# Patient Record
Sex: Female | Born: 1935 | Race: White | Hispanic: No | Smoking: Never smoker
Health system: Southern US, Community
[De-identification: ages and names within clinical notes are randomized; demographics above are authoritative.]

## PROBLEM LIST (undated history)

## (undated) DIAGNOSIS — I82409 Acute embolism and thrombosis of unspecified deep veins of unspecified lower extremity: Secondary | ICD-10-CM

## (undated) DIAGNOSIS — C4491 Basal cell carcinoma of skin, unspecified: Secondary | ICD-10-CM

## (undated) DIAGNOSIS — K5790 Diverticulosis of intestine, part unspecified, without perforation or abscess without bleeding: Secondary | ICD-10-CM

## (undated) DIAGNOSIS — C4490 Unspecified malignant neoplasm of skin, unspecified: Secondary | ICD-10-CM

## (undated) DIAGNOSIS — I10 Essential (primary) hypertension: Secondary | ICD-10-CM

## (undated) DIAGNOSIS — N289 Disorder of kidney and ureter, unspecified: Secondary | ICD-10-CM

## (undated) DIAGNOSIS — R55 Syncope and collapse: Secondary | ICD-10-CM

## (undated) DIAGNOSIS — S42009A Fracture of unspecified part of unspecified clavicle, initial encounter for closed fracture: Secondary | ICD-10-CM

## (undated) DIAGNOSIS — I639 Cerebral infarction, unspecified: Secondary | ICD-10-CM

## (undated) DIAGNOSIS — Z8739 Personal history of other diseases of the musculoskeletal system and connective tissue: Secondary | ICD-10-CM

## (undated) DIAGNOSIS — E785 Hyperlipidemia, unspecified: Secondary | ICD-10-CM

## (undated) DIAGNOSIS — Z905 Acquired absence of kidney: Secondary | ICD-10-CM

## (undated) DIAGNOSIS — Z90710 Acquired absence of both cervix and uterus: Secondary | ICD-10-CM

## (undated) DIAGNOSIS — Z923 Personal history of irradiation: Secondary | ICD-10-CM

## (undated) DIAGNOSIS — I8393 Asymptomatic varicose veins of bilateral lower extremities: Secondary | ICD-10-CM

## (undated) DIAGNOSIS — D0510 Intraductal carcinoma in situ of unspecified breast: Principal | ICD-10-CM

## (undated) DIAGNOSIS — R413 Other amnesia: Secondary | ICD-10-CM

## (undated) HISTORY — DX: Cerebral infarction, unspecified: I63.9

## (undated) HISTORY — PX: LAPAROSCOPIC CHOLECYSTECTOMY: SUR755

## (undated) HISTORY — PX: BRAIN SURGERY: SHX531

## (undated) HISTORY — DX: Basal cell carcinoma of skin, unspecified: C44.91

## (undated) HISTORY — PX: INTRACAPSULAR CATARACT EXTRACTION: SHX361

## (undated) HISTORY — DX: Hyperlipidemia, unspecified: E78.5

## (undated) HISTORY — DX: Essential (primary) hypertension: I10

## (undated) HISTORY — DX: Acute embolism and thrombosis of unspecified deep veins of unspecified lower extremity: I82.409

## (undated) HISTORY — DX: Acquired absence of kidney: Z90.5

## (undated) HISTORY — DX: Syncope and collapse: R55

## (undated) HISTORY — DX: Personal history of irradiation: Z92.3

## (undated) HISTORY — DX: Other amnesia: R41.3

## (undated) HISTORY — DX: Personal history of other diseases of the musculoskeletal system and connective tissue: Z87.39

## (undated) HISTORY — DX: Acquired absence of both cervix and uterus: Z90.710

## (undated) HISTORY — DX: Asymptomatic varicose veins of bilateral lower extremities: I83.93

## (undated) HISTORY — DX: Fracture of unspecified part of unspecified clavicle, initial encounter for closed fracture: S42.009A

## (undated) HISTORY — DX: Unspecified malignant neoplasm of skin, unspecified: C44.90

## (undated) HISTORY — DX: Disorder of kidney and ureter, unspecified: N28.9

## (undated) HISTORY — DX: Diverticulosis of intestine, part unspecified, without perforation or abscess without bleeding: K57.90

---
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
## 1976-06-29 HISTORY — PX: VAGINAL HYSTERECTOMY: SUR661

## 1999-01-28 ENCOUNTER — Other Ambulatory Visit: Admission: RE | Admit: 1999-01-28 | Discharge: 1999-01-28 | Payer: Self-pay | Admitting: Obstetrics and Gynecology

## 1999-06-03 ENCOUNTER — Encounter: Admission: RE | Admit: 1999-06-03 | Discharge: 1999-06-03 | Payer: Self-pay | Admitting: Obstetrics and Gynecology

## 1999-06-03 ENCOUNTER — Encounter: Payer: Self-pay | Admitting: Obstetrics and Gynecology

## 2000-04-23 ENCOUNTER — Other Ambulatory Visit: Admission: RE | Admit: 2000-04-23 | Discharge: 2000-04-23 | Payer: Self-pay | Admitting: Obstetrics and Gynecology

## 2000-11-09 ENCOUNTER — Encounter: Admission: RE | Admit: 2000-11-09 | Discharge: 2000-11-09 | Payer: Self-pay | Admitting: Emergency Medicine

## 2000-11-09 ENCOUNTER — Encounter: Payer: Self-pay | Admitting: Emergency Medicine

## 2001-05-23 ENCOUNTER — Other Ambulatory Visit: Admission: RE | Admit: 2001-05-23 | Discharge: 2001-05-23 | Payer: Self-pay | Admitting: Obstetrics and Gynecology

## 2001-09-01 ENCOUNTER — Encounter: Payer: Self-pay | Admitting: Obstetrics and Gynecology

## 2001-09-01 ENCOUNTER — Encounter: Admission: RE | Admit: 2001-09-01 | Discharge: 2001-09-01 | Payer: Self-pay | Admitting: Obstetrics and Gynecology

## 2002-05-24 ENCOUNTER — Encounter: Payer: Self-pay | Admitting: Cardiology

## 2002-05-24 ENCOUNTER — Encounter: Admission: RE | Admit: 2002-05-24 | Discharge: 2002-05-24 | Payer: Self-pay | Admitting: Cardiology

## 2002-06-01 ENCOUNTER — Encounter (HOSPITAL_BASED_OUTPATIENT_CLINIC_OR_DEPARTMENT_OTHER): Payer: Self-pay | Admitting: General Surgery

## 2002-06-02 ENCOUNTER — Ambulatory Visit (HOSPITAL_COMMUNITY): Admission: RE | Admit: 2002-06-02 | Discharge: 2002-06-03 | Payer: Self-pay | Admitting: General Surgery

## 2002-06-02 ENCOUNTER — Encounter (HOSPITAL_BASED_OUTPATIENT_CLINIC_OR_DEPARTMENT_OTHER): Payer: Self-pay | Admitting: General Surgery

## 2003-05-21 ENCOUNTER — Ambulatory Visit (HOSPITAL_COMMUNITY): Admission: RE | Admit: 2003-05-21 | Discharge: 2003-05-21 | Payer: Self-pay | Admitting: Obstetrics and Gynecology

## 2004-04-29 DIAGNOSIS — S42009A Fracture of unspecified part of unspecified clavicle, initial encounter for closed fracture: Secondary | ICD-10-CM

## 2004-04-29 HISTORY — DX: Fracture of unspecified part of unspecified clavicle, initial encounter for closed fracture: S42.009A

## 2004-09-09 ENCOUNTER — Ambulatory Visit (HOSPITAL_COMMUNITY): Admission: RE | Admit: 2004-09-09 | Discharge: 2004-09-09 | Payer: Self-pay | Admitting: Cardiology

## 2004-12-24 ENCOUNTER — Encounter: Admission: RE | Admit: 2004-12-24 | Discharge: 2004-12-24 | Payer: Self-pay | Admitting: Internal Medicine

## 2006-03-31 ENCOUNTER — Encounter: Admission: RE | Admit: 2006-03-31 | Discharge: 2006-03-31 | Payer: Self-pay | Admitting: Gastroenterology

## 2006-04-06 ENCOUNTER — Encounter: Admission: RE | Admit: 2006-04-06 | Discharge: 2006-04-06 | Payer: Self-pay | Admitting: Gastroenterology

## 2006-11-23 ENCOUNTER — Ambulatory Visit (HOSPITAL_COMMUNITY): Admission: RE | Admit: 2006-11-23 | Discharge: 2006-11-23 | Payer: Self-pay | Admitting: Obstetrics and Gynecology

## 2006-11-28 ENCOUNTER — Emergency Department (HOSPITAL_COMMUNITY): Admission: EM | Admit: 2006-11-28 | Discharge: 2006-11-28 | Payer: Self-pay | Admitting: Emergency Medicine

## 2006-11-28 DIAGNOSIS — R413 Other amnesia: Secondary | ICD-10-CM

## 2006-11-28 HISTORY — DX: Other amnesia: R41.3

## 2007-01-01 ENCOUNTER — Inpatient Hospital Stay (HOSPITAL_COMMUNITY): Admission: EM | Admit: 2007-01-01 | Discharge: 2007-01-03 | Payer: Self-pay | Admitting: Emergency Medicine

## 2007-01-01 ENCOUNTER — Ambulatory Visit: Payer: Self-pay | Admitting: Cardiology

## 2007-01-14 ENCOUNTER — Emergency Department (HOSPITAL_COMMUNITY): Admission: EM | Admit: 2007-01-14 | Discharge: 2007-01-14 | Payer: Self-pay | Admitting: Emergency Medicine

## 2007-05-20 HISTORY — PX: DOPPLER ECHOCARDIOGRAPHY: SHX263

## 2007-11-24 ENCOUNTER — Emergency Department (HOSPITAL_COMMUNITY): Admission: EM | Admit: 2007-11-24 | Discharge: 2007-11-24 | Payer: Self-pay | Admitting: Family Medicine

## 2007-11-25 ENCOUNTER — Encounter (INDEPENDENT_AMBULATORY_CARE_PROVIDER_SITE_OTHER): Payer: Self-pay | Admitting: Emergency Medicine

## 2007-11-25 ENCOUNTER — Ambulatory Visit: Payer: Self-pay | Admitting: Vascular Surgery

## 2007-11-25 ENCOUNTER — Ambulatory Visit (HOSPITAL_COMMUNITY): Admission: RE | Admit: 2007-11-25 | Discharge: 2007-11-25 | Payer: Self-pay | Admitting: Emergency Medicine

## 2007-11-28 DIAGNOSIS — I82409 Acute embolism and thrombosis of unspecified deep veins of unspecified lower extremity: Secondary | ICD-10-CM

## 2007-11-28 HISTORY — DX: Acute embolism and thrombosis of unspecified deep veins of unspecified lower extremity: I82.409

## 2007-12-31 DIAGNOSIS — I639 Cerebral infarction, unspecified: Secondary | ICD-10-CM

## 2007-12-31 HISTORY — DX: Cerebral infarction, unspecified: I63.9

## 2008-01-28 HISTORY — PX: OTHER SURGICAL HISTORY: SHX169

## 2008-04-04 ENCOUNTER — Ambulatory Visit (HOSPITAL_COMMUNITY): Admission: RE | Admit: 2008-04-04 | Discharge: 2008-04-04 | Payer: Self-pay | Admitting: Obstetrics and Gynecology

## 2008-08-18 ENCOUNTER — Emergency Department (HOSPITAL_COMMUNITY): Admission: EM | Admit: 2008-08-18 | Discharge: 2008-08-18 | Payer: Self-pay | Admitting: Emergency Medicine

## 2009-02-15 ENCOUNTER — Emergency Department (HOSPITAL_COMMUNITY): Admission: EM | Admit: 2009-02-15 | Discharge: 2009-02-15 | Payer: Self-pay | Admitting: Family Medicine

## 2009-04-11 ENCOUNTER — Ambulatory Visit (HOSPITAL_COMMUNITY): Admission: RE | Admit: 2009-04-11 | Discharge: 2009-04-11 | Payer: Self-pay | Admitting: Obstetrics and Gynecology

## 2009-06-29 DIAGNOSIS — D0510 Intraductal carcinoma in situ of unspecified breast: Secondary | ICD-10-CM

## 2009-06-29 HISTORY — DX: Intraductal carcinoma in situ of unspecified breast: D05.10

## 2010-04-03 ENCOUNTER — Ambulatory Visit: Payer: Self-pay | Admitting: Cardiology

## 2010-05-16 ENCOUNTER — Ambulatory Visit (HOSPITAL_COMMUNITY): Admission: RE | Admit: 2010-05-16 | Discharge: 2010-05-16 | Payer: Self-pay | Admitting: Obstetrics and Gynecology

## 2010-06-03 ENCOUNTER — Encounter: Admission: RE | Admit: 2010-06-03 | Discharge: 2010-06-03 | Payer: Self-pay | Admitting: Obstetrics and Gynecology

## 2010-06-03 DIAGNOSIS — D0510 Intraductal carcinoma in situ of unspecified breast: Secondary | ICD-10-CM | POA: Insufficient documentation

## 2010-06-10 ENCOUNTER — Encounter
Admission: RE | Admit: 2010-06-10 | Discharge: 2010-06-10 | Payer: Self-pay | Source: Home / Self Care | Attending: Obstetrics and Gynecology | Admitting: Obstetrics and Gynecology

## 2010-06-18 ENCOUNTER — Encounter
Admission: RE | Admit: 2010-06-18 | Discharge: 2010-06-18 | Payer: Self-pay | Source: Home / Self Care | Attending: Surgery | Admitting: Surgery

## 2010-06-18 ENCOUNTER — Ambulatory Visit (HOSPITAL_BASED_OUTPATIENT_CLINIC_OR_DEPARTMENT_OTHER)
Admission: RE | Admit: 2010-06-18 | Discharge: 2010-06-18 | Payer: Self-pay | Source: Home / Self Care | Attending: Surgery | Admitting: Surgery

## 2010-06-18 HISTORY — PX: BREAST LUMPECTOMY: SHX2

## 2010-06-20 ENCOUNTER — Ambulatory Visit: Payer: Self-pay | Admitting: Oncology

## 2010-07-04 ENCOUNTER — Ambulatory Visit
Admission: RE | Admit: 2010-07-04 | Discharge: 2010-07-29 | Payer: Self-pay | Source: Home / Self Care | Attending: Radiation Oncology | Admitting: Radiation Oncology

## 2010-07-09 ENCOUNTER — Ambulatory Visit: Payer: Self-pay | Admitting: Genetic Counselor

## 2010-07-15 ENCOUNTER — Encounter
Admission: RE | Admit: 2010-07-15 | Discharge: 2010-07-15 | Payer: Self-pay | Source: Home / Self Care | Attending: Radiation Oncology | Admitting: Radiation Oncology

## 2010-07-20 ENCOUNTER — Encounter: Payer: Self-pay | Admitting: Obstetrics and Gynecology

## 2010-07-30 ENCOUNTER — Ambulatory Visit: Payer: Federal, State, Local not specified - PPO | Attending: Radiation Oncology | Admitting: Radiation Oncology

## 2010-07-30 DIAGNOSIS — C50419 Malignant neoplasm of upper-outer quadrant of unspecified female breast: Secondary | ICD-10-CM | POA: Insufficient documentation

## 2010-07-30 DIAGNOSIS — D0590 Unspecified type of carcinoma in situ of unspecified breast: Secondary | ICD-10-CM | POA: Insufficient documentation

## 2010-07-30 DIAGNOSIS — L538 Other specified erythematous conditions: Secondary | ICD-10-CM | POA: Insufficient documentation

## 2010-07-30 DIAGNOSIS — Z510 Encounter for antineoplastic radiation therapy: Secondary | ICD-10-CM | POA: Insufficient documentation

## 2010-08-01 ENCOUNTER — Ambulatory Visit: Payer: Self-pay | Admitting: Cardiology

## 2010-08-07 ENCOUNTER — Ambulatory Visit (INDEPENDENT_AMBULATORY_CARE_PROVIDER_SITE_OTHER): Payer: Federal, State, Local not specified - PPO | Admitting: Cardiology

## 2010-08-07 DIAGNOSIS — I119 Hypertensive heart disease without heart failure: Secondary | ICD-10-CM

## 2010-08-07 DIAGNOSIS — Z79899 Other long term (current) drug therapy: Secondary | ICD-10-CM

## 2010-08-07 DIAGNOSIS — E7800 Pure hypercholesterolemia, unspecified: Secondary | ICD-10-CM

## 2010-09-08 LAB — URINALYSIS, ROUTINE W REFLEX MICROSCOPIC
Bilirubin Urine: NEGATIVE
Glucose, UA: NEGATIVE mg/dL
Hgb urine dipstick: NEGATIVE
Ketones, ur: NEGATIVE mg/dL
Nitrite: NEGATIVE
Protein, ur: NEGATIVE mg/dL
Specific Gravity, Urine: 1.021 (ref 1.005–1.030)
Urobilinogen, UA: 0.2 mg/dL (ref 0.0–1.0)
pH: 6 (ref 5.0–8.0)

## 2010-09-08 LAB — COMPREHENSIVE METABOLIC PANEL
ALT: 27 U/L (ref 0–35)
AST: 26 U/L (ref 0–37)
Albumin: 3.3 g/dL — ABNORMAL LOW (ref 3.5–5.2)
Alkaline Phosphatase: 42 U/L (ref 39–117)
BUN: 19 mg/dL (ref 6–23)
CO2: 25 mEq/L (ref 19–32)
Calcium: 9.4 mg/dL (ref 8.4–10.5)
Chloride: 106 mEq/L (ref 96–112)
Creatinine, Ser: 1.08 mg/dL (ref 0.4–1.2)
GFR calc Af Amer: 59 mL/min — ABNORMAL LOW (ref >60)
GFR calc non Af Amer: 49 mL/min — ABNORMAL LOW (ref >60)
Glucose, Bld: 116 mg/dL — ABNORMAL HIGH (ref 70–99)
Potassium: 4.3 mEq/L (ref 3.5–5.1)
Sodium: 139 mEq/L (ref 135–145)
Total Bilirubin: 0.8 mg/dL (ref 0.3–1.2)
Total Protein: 6.6 g/dL (ref 6.0–8.3)

## 2010-09-08 LAB — CBC
HCT: 39.5 % (ref 36.0–46.0)
Hemoglobin: 13.8 g/dL (ref 12.0–15.0)
MCH: 34.7 pg — ABNORMAL HIGH (ref 26.0–34.0)
MCHC: 34.9 g/dL (ref 30.0–36.0)
MCV: 99.2 fL (ref 78.0–100.0)
Platelets: 166 10*3/uL (ref 150–400)
RBC: 3.98 MIL/uL (ref 3.87–5.11)
RDW: 13 % (ref 11.5–15.5)
WBC: 4.6 10*3/uL (ref 4.0–10.5)

## 2010-09-08 LAB — URINE MICROSCOPIC-ADD ON

## 2010-09-08 LAB — DIFFERENTIAL
Basophils Absolute: 0.1 10*3/uL (ref 0.0–0.1)
Basophils Relative: 1 % (ref 0–1)
Eosinophils Absolute: 0.3 10*3/uL (ref 0.0–0.7)
Eosinophils Relative: 8 % — ABNORMAL HIGH (ref 0–5)
Lymphocytes Relative: 51 % — ABNORMAL HIGH (ref 12–46)
Lymphs Abs: 2.3 10*3/uL (ref 0.7–4.0)
Monocytes Absolute: 0.6 10*3/uL (ref 0.1–1.0)
Monocytes Relative: 12 % (ref 3–12)
Neutro Abs: 1.3 10*3/uL — ABNORMAL LOW (ref 1.7–7.7)
Neutrophils Relative %: 28 % — ABNORMAL LOW (ref 43–77)

## 2010-09-18 ENCOUNTER — Ambulatory Visit: Payer: Federal, State, Local not specified - PPO | Attending: Radiation Oncology | Admitting: Radiation Oncology

## 2010-09-18 DIAGNOSIS — D0590 Unspecified type of carcinoma in situ of unspecified breast: Secondary | ICD-10-CM | POA: Insufficient documentation

## 2010-09-18 DIAGNOSIS — Z923 Personal history of irradiation: Secondary | ICD-10-CM | POA: Insufficient documentation

## 2010-09-29 ENCOUNTER — Other Ambulatory Visit: Payer: Self-pay | Admitting: Oncology

## 2010-09-29 ENCOUNTER — Encounter (HOSPITAL_BASED_OUTPATIENT_CLINIC_OR_DEPARTMENT_OTHER): Payer: Federal, State, Local not specified - PPO | Admitting: Oncology

## 2010-09-29 DIAGNOSIS — D0590 Unspecified type of carcinoma in situ of unspecified breast: Secondary | ICD-10-CM

## 2010-09-29 LAB — COMPREHENSIVE METABOLIC PANEL
ALT: 26 U/L (ref 0–35)
AST: 22 U/L (ref 0–37)
Albumin: 4 g/dL (ref 3.5–5.2)
Alkaline Phosphatase: 66 U/L (ref 39–117)
BUN: 12 mg/dL (ref 6–23)
CO2: 26 mEq/L (ref 19–32)
Calcium: 9 mg/dL (ref 8.4–10.5)
Chloride: 104 mEq/L (ref 96–112)
Creatinine, Ser: 1.07 mg/dL (ref 0.40–1.20)
Glucose, Bld: 87 mg/dL (ref 70–99)
Potassium: 3.9 mEq/L (ref 3.5–5.3)
Sodium: 140 mEq/L (ref 135–145)
Total Bilirubin: 0.4 mg/dL (ref 0.3–1.2)
Total Protein: 6.5 g/dL (ref 6.0–8.3)

## 2010-09-29 LAB — CBC WITH DIFFERENTIAL/PLATELET
BASO%: 0.9 % (ref 0.0–2.0)
Basophils Absolute: 0 10*3/uL (ref 0.0–0.1)
EOS%: 5.6 % (ref 0.0–7.0)
Eosinophils Absolute: 0.3 10*3/uL (ref 0.0–0.5)
HCT: 37.3 % (ref 34.8–46.6)
HGB: 12.7 g/dL (ref 11.6–15.9)
LYMPH%: 30.8 % (ref 14.0–49.7)
MCH: 32.7 pg (ref 25.1–34.0)
MCHC: 34.1 g/dL (ref 31.5–36.0)
MCV: 95.7 fL (ref 79.5–101.0)
MONO#: 0.7 10*3/uL (ref 0.1–0.9)
MONO%: 13.1 % (ref 0.0–14.0)
NEUT#: 2.6 10*3/uL (ref 1.5–6.5)
NEUT%: 49.6 % (ref 38.4–76.8)
Platelets: 174 10*3/uL (ref 145–400)
RBC: 3.89 10*6/uL (ref 3.70–5.45)
RDW: 13.2 % (ref 11.2–14.5)
WBC: 5.2 10*3/uL (ref 3.9–10.3)
lymph#: 1.6 10*3/uL (ref 0.9–3.3)

## 2010-10-04 LAB — POCT URINALYSIS DIP (DEVICE)
Bilirubin Urine: NEGATIVE
Glucose, UA: NEGATIVE mg/dL
Hgb urine dipstick: NEGATIVE
Ketones, ur: NEGATIVE mg/dL
Nitrite: NEGATIVE
Protein, ur: NEGATIVE mg/dL
Specific Gravity, Urine: 1.015 (ref 1.005–1.030)
Urobilinogen, UA: 0.2 mg/dL (ref 0.0–1.0)
pH: 5.5 (ref 5.0–8.0)

## 2010-10-14 LAB — POCT URINALYSIS DIP (DEVICE)
Bilirubin Urine: NEGATIVE
Glucose, UA: NEGATIVE mg/dL
Hgb urine dipstick: NEGATIVE
Ketones, ur: NEGATIVE mg/dL
Nitrite: POSITIVE — AB
Protein, ur: NEGATIVE mg/dL
Specific Gravity, Urine: 1.02 (ref 1.005–1.030)
Urobilinogen, UA: 0.2 mg/dL (ref 0.0–1.0)
pH: 5.5 (ref 5.0–8.0)

## 2010-11-06 ENCOUNTER — Other Ambulatory Visit: Payer: Self-pay | Admitting: Cardiology

## 2010-11-06 DIAGNOSIS — E7800 Pure hypercholesterolemia, unspecified: Secondary | ICD-10-CM

## 2010-11-06 DIAGNOSIS — Z79899 Other long term (current) drug therapy: Secondary | ICD-10-CM

## 2010-11-11 NOTE — H&P (Signed)
NAME:  Shannon Serrano, Shannon Serrano              ACCOUNT NO.:  [id number]   MEDICAL RECORD NO.:  [id number]          PATIENT TYPE:  INP   LOCATION:  1823                         FACILITY:  [facility]   PHYSICIAN:  Cassell Clement, M.D. DATE OF BIRTH:  02-01-1929   DATE OF ADMISSION:  12/31/2006  DATE OF DISCHARGE:                              HISTORY & PHYSICAL   The patient is full code.  Total visit time approximately 55 minutes.  The patient's primary care physician and cardiologist is Dr. Patty Sermons,  [facility].  This patient was the historian as well as her  daughter, her husband and her granddaughter.  They were all good  historians.  The patient's chief complaint is chest pain.   HISTORY OF PRESENT ILLNESS:  Shannon Serrano is a 77 year old female with a  history of hypertension, hyperlipidemia, and fibromyalgia who presented  to the emergency room with a story of passing out.  The patient notes  early in the day drinking 2 ounces of margaritas a beer, which she notes  is significantly unusual for her but drank more than usual due to the  festivities.  She apparently had dinner around 9:00 p.m.  At around 9:30  p.m., she went outside to look at the fireworks with her granddaughter  and noted feeling funny, that is apparently lightheaded and then notes  shortly thereafter she was in the floor.  Per the granddaughter, this  was short lived with no apparent convulsions or loss of bowel or  bladder.  The patient has been having decreased p.o. intake over the  last few weeks, as Dr. Patty Sermons recommended this due to trying to lose  weight.  No recent nausea, vomiting or acute illness.  The patient was  seen in the emergency room in 11/28/2006 for a transient global amnesia.  CT of the head at the time was negative for any acute disease with signs  of an old right lacunar stroke.  This apparently lasted 7 hours.  She  was unable even to remember her name, which completely resolved.  The  patient has not taken any Flexeril over the last two weeks.  The patient  notes she has been taking her medications.  She denies any recent chest  pain or any severe high blood pressure episodes.  She denies any  paroxysmal nocturnal dyspnea or orthopnea.  She denies any palpitations.   PAST MEDICAL HISTORY:  1. The patient has never had a heart cath or coronary artery bypass      procedure.  2. She notes getting a stress test performed one or two years ago that      was normal per patient.  3. She has a history of hypertension.  4. History of hyperlipidemia.  5. History of fibromyalgia.  6. History of hysterectomy for fibroids.  She still has her ovaries.  7. She has had an appendectomy and gallbladder surgery.  8. She also had cataract surgery on the right eye.   MEDICATIONS:  1. Prinivil 10 mg by mouth twice a day.  2. Lopressor 50 mg twice a day.  3. Zetia 10 mg daily.  4. Coenzyme Q10 at 100 mg twice a day.  5. Flexeril 10 mg as needed.  6. Aspirin 325 mg as needed.  7. [place] which is now [place] 1 g by mouth twice a day.  8. Tylenol 500 p.r.n.   ALLERGIES:  1. Codeine, which causes a rash.  2. Penicillin, which causes nausea.  3. Sulfa drugs, which causes a rash.   SOCIAL HISTORY:  She lives with her husband.  She has four grown  children.  No tobacco use ever and rare alcohol use in the past.   FAMILY HISTORY:  Positive with a sister needing a pacemaker and her  father who passed away of sudden death at the age of 45.   REVIEW OF SYSTEMS:  Concerning her eyes, she has had a loss or change of  vision, which is chronic, and she has had eye pain or redness in the  past.  No excess watering or double or blurred vision.  Her ears and  hearing:  She has had chronic loss of hearing and buzzing noises in her  ear.  No ear infections or drainage.  No hoarseness or excessive  sneezing.  No nose bleeds or runny nose.  No difficulty swallowing.  No  wheezing or large quantities  of sputum.  No excessive cough or shortness  of breath.  She has had allergy and cold symptoms in the past.  No  pneumonias.  She has had no frequent or severe headaches.  No dizziness,  fainting spells prior, no seizures or convulsions.  No shaking or  twitching spells.  No paralysis of the limbs.  No numbness or tingling  of the body.  She has had unusual head and neck tension.  CARDIOVASCULAR:  As above, and she has had no chest pain, no low blood  pressure troubles, no problems with calf cramps with walking or  sensitivities of the fingers and toes in cold.  She does have varicose  veins and high blood pressure.  No history of heart murmurs or rheumatic  fever.  GASTROINTESTINAL:  No digestive difficulties.  No loss of  appetite.  No diarrhea or blood in the stools.  She has had hemorrhoids  and constipation history.  No diabetes or bladder disease.  She does  have a history of urinary incontinence with dribble.  No blood in the  urine or chronic urgency of urination, but she has had difficulty  starting and passing urine.  No flank pain.  GENITAL:  Female.  No  breast pain.  No tubal infections.  No bleeding history.  MUSCULOSKELETAL:  No arthritis or bursitis.  She does note neck and back  pain history, though.  EMOTIONAL/PSYCHOLOGIC:  No depression, no severe  tension, no recurrent fevers, no nervous exhaustion.  She has had  difficulty sleeping.  SKIN:  No rashes.  Other review of systems were  negative.   PHYSICAL EXAMINATION:  VITAL SIGNS:  Temperature 97.2 with a pulse of  82, respiratory rate of 18, blood pressure 134/77, saturations 97% on  room air.  GENERAL:  She is a female who looks her stated age in no acute distress.  HEENT:  She has a scapular bruise, laceration in the area of her right  occipital parietal region that is swollen and oozing a little blood.  Pupils equal, round, reactive to light.  Her extraocular movements are  intact, and her sclerae are anicteric  with moist mucous membranes.  NECK:  Supple with no lymphadenopathy, thyromegaly.  No  bruits.  No  jugular venous distention.  CARDIOVASCULAR:  Regular rhythm and rate with a normal S1, S2.  No S3 or  S4.  No displacement of the point of maximal impulse.  No carotid  bruits.  No jugular venous distention.  No abdominal bruits.  Pulses 2+  bilaterally dorsalis pedis and radial.  LUNGS:  Clear to auscultation bilaterally.  SKIN:  No rashes or lesions.  ABDOMEN:  Soft, nontender, nondistended with normoactive bowel sounds.  EXTREMITIES:  No clubbing, cyanosis.  She does have a hematoma  subcutaneously in the area of the anterior right portion of her right  leg with no tenderness or drop offs over the bone in that area.  She has  no apparent rib pain on palpation or back pain on palpation or neck pain  as well.  MUSCULOSKELETAL:  As above and no apparent effusions or joint effusions  or step-offs.  NEUROLOGIC:  She is alert and oriented x3 with cranial nerves II-XII  grossly intact.  Strength and sensation grossly intact.   LABORATORY DATA:  The patient's EKG on 11/28/2006 showed a normal sinus  rhythm at a vent rate of 88, PR interval 175, QRS 79 and QT corrected at  428, normal axis, normal intervals with no significant ST-T changes.  Her EKG here is pending.   LABORATORY DATA:  She has a sodium of 137, potassium of 3.8, chloride  107, bicarbonate 21, creatinine 1, glucose 147.  Cardiac markers at 2317  hours were negative.  INR 0.9, PT 12.7, venous blood gas 7.414 with a  CO2 of 35.3, bicarbonate 22.6.  CT of the head:  No official report here  yet, but she had evidence of subarachnoid hemorrhage, very small.  There  was mild small vessel ischemic changes and probable old right lacunar  stroke, which is new compared to a CT on 11/28/2006.   ASSESSMENT AND PLAN:  1. This is a patient with a history of hypertension, hyperlipidemia,      who presents with syncope, possibly related to  alcohol ingestion,      but we will rule out acute coronary syndrome or any erythemogenic      cause of her syncope or any orthostatic trouble.  We will get a      urinalysis, TSH, B12, folate, orthostatics, telemetry.  She will be      on telemetry in the ICU, and we will check an EKG, and cardiac      markers will be drawn x3, and also IV fluids.  2. For her subarachnoid hemorrhage, Dr. Jeral Fruit was consulted by the      emergency room physician, and he stated that the subarachnoid      hemorrhage was mild and stable, and he will see her in the morning.      She will be on no antiplatelets or any anticoagulants, and we will      control her blood pressure closely and aggressively.  We will also      have her on neuro checks every 2 hours.  She also has a hematoma of      the right leg.  We will get x-rays there.  For her scapula      hematoma, she is supposed to get some stitches or staples before      she leaves the emergency room, as she does have some oozing there      still.  Pain control with Tylenol.  DVT prophylaxis with pneumatic  compression devices on the right leg, and GI prophylaxis with      Protonix by mouth twice a day.      Darryl D. Prime, MD  Electronically Signed     ______________________________  Cassell Clement, M.D.    DDP/MEDQ  D:  01/01/2007  T:  01/01/2007  Job:  [id number]

## 2010-11-11 NOTE — Discharge Summary (Signed)
NAME:  Shannon Serrano, Shannon Serrano              ACCOUNT NO.:  [id number]   MEDICAL RECORD NO.:  [id number]          PATIENT TYPE:  INP   LOCATION:  2020                         FACILITY:  [facility]   PHYSICIAN:  Cassell Clement, M.D. DATE OF BIRTH:  Jul 26, 1928   DATE OF ADMISSION:  12/31/2006  DATE OF DISCHARGE:  01/03/2007                               DISCHARGE SUMMARY   FINAL DIAGNOSES:  1. Syncope.  2. Head trauma with small subarachnoid hemorrhage.  3. History of hypertension.  4. History of hyperlipidemia.  5. History of fibromyalgia.   OPERATIONS PERFORMED:  None.   HISTORY:  This is a 77 year old female with a history of essential  hypertension, hyperlipidemia and fibromyalgia.  She presented to the  emergency room after having syncope while standing on the porch of her  home watching fireworks at approximately 9:30 p.m.  As she fell, she did  hit her head and also hit her right elbow and right leg.  She was not  aware of any chest pain or palpitations.  Just prior to syncope, she had  a very brief awareness that she felt lightheaded and then the next thing  she knew she was on the floor and her granddaughter was helping her up.  She did suffer a laceration of the right occiput.  She came to the  emergency room where CT of the head showed that she had a small  subarachnoid hemorrhage in the right frontotemporal area but no evidence  of any shift.  Dr. Jeral Fruit was consulted and felt that the patient would  do fine with conservative management and that there was no need for any  surgical intervention.  There was also mild scalp hematoma for which she  was given staples in the emergency room.   HOSPITAL COURSE:  The patient was initially admitted to the coronary  care step-down unit for close observation.  She was monitored on  telemetry the entire hospital stay and she showed no evidence of any  arrhythmias which could be attributed to causing her syncope.  Her  activity was gradually  increased.  A follow-up scan showed no  progression and the patient was felt ready for discharge home on January 03, 2007.   ACCESSORY LABORATORY STUDIES:  Her CBC on admission showed a hemoglobin  of 12.7, white count 9000.  The following day, hemoglobin 11.5, her  white count was normal at 8200, platelet count was 146,000.  Electrolytes were normal with potassium 4.2, BUN 10, creatinine 1.03.  Coagulation studies were normal.  Liver function studies normal cardiac  enzymes showed no evidence of myocardial infarction.  Lipids showed  cholesterol 184, LDL 117, HDL 35, triglycerides 161.  Urinalysis was  unremarkable.  Cardiac markers were normal.  TSH was 4.53 which is  normal.  Vitamin B12 level was 1320.  Folate level was greater than 20.  Glycosylated hemoglobin was 6.2.  Her blood sugar was 117, nonfasting.   X-ray of the right tibia-fibula showed no evidence of fracture or  dislocation.  X-ray of the right elbow showed no evidence of any acute  bony or joint abnormality.  The patient is being discharged improved on the following regimen.  1. Lopressor 50 mg twice a day.  2. Lisinopril 10 mg twice a day.  3. Zetia 10 mg daily.  4. Coenzyme Q 10 one twice a day.  5. Aspirin 81 mg daily and she will start that on January 17, 2007.  6. Lovaza 1 gram twice a day, start that on January 17, 2007.  7. Tylenol 500 mg two every 6 hours p.r.n. for pain.  8. She also has Flexeril which she takes p.r.n. for fibromyalgia pain.   She will return to the, Urgent Care on January 10, 2007, one week from  today for staple removal.  She is to avoid any heavy lifting or  straining.  She is to keep her regular appointment to see Dr. Patty Sermons  in August or sooner p.r.n.  She will be on a low sodium heart healthy,  no free sweets diet.           ______________________________  Cassell Clement, M.D.     TB/MEDQ  D:  01/03/2007  T:  01/03/2007  Job:  [id number]   cc:   Hilda Lias, M.D.

## 2010-11-11 NOTE — Consult Note (Signed)
NAME:  Shannon Serrano, Shannon Serrano              ACCOUNT NO.:  [id number]   MEDICAL RECORD NO.:  [id number]          PATIENT TYPE:  INP   LOCATION:  2904                         FACILITY:  [facility]   PHYSICIAN:  Hilda Lias, M.D.   DATE OF BIRTH:  May 29, 1929   DATE OF CONSULTATION:  01/01/2007  DATE OF DISCHARGE:                                 CONSULTATION   CLINICAL HISTORY:  [name] was brought to the emergency room last  night because she fell about 9 p.m. on December 31, 2006.  She was brought to  the emergency room.  Workup was done including a CT scan.  The patient  was awake, oriented, following commands.  Because of the findings of the  CT scan, we were called to see [name].  At the present time she is  awake, oriented x3.  She is following commands.  She complains of nausea  and some mild dizziness.  She complains of some tenderness in the scalp.  She remembered falling but she does not remember coming to the emergency  room.   PHYSICAL EXAMINATION:  GENERAL:  Clinically, she is oriented x3.  HEENT:  The pupils are equal and reactive at 3 mm.  She has full ocular  movements.  Face is symmetrical.  She sticks out her tongue in midline  and is able to move her shoulders.  She has some tenderness in the  scalp.  STRENGTH:  She has normal strength in the upper and lower extremities.  REFLEXES:  1+.  SENSATION:  Normal.  MUSCULOSKELETAL:  She has some tenderness in the cervical spine.   The CT scan of the head showed she has a small subarachnoid hemorrhage  in the right frontotemporal area.  There is no evidence of any shift  whatsoever.  There is some mild scalp hematoma.  Cervical spine x-ray  shows some degenerative disk disease, mostly at the level of C5-C6.   CLINICAL IMPRESSION:  1. Syncopal episode to be worked up by Dr. Patty Sermons.  2. Traumatic small subarachnoid hemorrhage which there is no need for      surgical intervention.  3. Cervical spondylosis at C5, C6 and  C7.   RECOMMENDATIONS:  We are going to follow the patient while she is in the  hospital.  We are going to repeat the CT scan in 24 hours.  I talked to  her and her husband who were present.           ______________________________  Hilda Lias, M.D.     EB/MEDQ  D:  01/01/2007  T:  01/01/2007  Job:  [id number]

## 2010-11-17 ENCOUNTER — Encounter (INDEPENDENT_AMBULATORY_CARE_PROVIDER_SITE_OTHER): Payer: Self-pay | Admitting: Surgery

## 2010-11-20 ENCOUNTER — Other Ambulatory Visit: Payer: Self-pay | Admitting: Cardiology

## 2010-11-20 DIAGNOSIS — M62838 Other muscle spasm: Secondary | ICD-10-CM

## 2010-11-20 DIAGNOSIS — E785 Hyperlipidemia, unspecified: Secondary | ICD-10-CM

## 2010-12-03 ENCOUNTER — Other Ambulatory Visit: Payer: Self-pay | Admitting: *Deleted

## 2010-12-03 DIAGNOSIS — E785 Hyperlipidemia, unspecified: Secondary | ICD-10-CM

## 2010-12-04 ENCOUNTER — Other Ambulatory Visit (INDEPENDENT_AMBULATORY_CARE_PROVIDER_SITE_OTHER): Payer: Federal, State, Local not specified - PPO | Admitting: *Deleted

## 2010-12-04 ENCOUNTER — Other Ambulatory Visit: Payer: Self-pay | Admitting: Cardiology

## 2010-12-04 ENCOUNTER — Encounter: Payer: Self-pay | Admitting: Cardiology

## 2010-12-04 DIAGNOSIS — E785 Hyperlipidemia, unspecified: Secondary | ICD-10-CM

## 2010-12-04 LAB — HEPATIC FUNCTION PANEL
ALT: 28 U/L (ref 0–35)
AST: 30 U/L (ref 0–37)
Albumin: 4.2 g/dL (ref 3.5–5.2)
Alkaline Phosphatase: 59 U/L (ref 39–117)
Bilirubin, Direct: 0.1 mg/dL (ref 0.0–0.3)
Total Bilirubin: 0.5 mg/dL (ref 0.3–1.2)
Total Protein: 7.2 g/dL (ref 6.0–8.3)

## 2010-12-04 LAB — LIPID PANEL
Cholesterol: 214 mg/dL — ABNORMAL HIGH (ref 0–200)
HDL: 59.6 mg/dL
Total CHOL/HDL Ratio: 4
Triglycerides: 108 mg/dL (ref 0.0–149.0)
VLDL: 21.6 mg/dL (ref 0.0–40.0)

## 2010-12-04 LAB — BASIC METABOLIC PANEL WITH GFR
BUN: 16 mg/dL (ref 6–23)
CO2: 24 meq/L (ref 19–32)
Calcium: 9 mg/dL (ref 8.4–10.5)
Chloride: 97 meq/L (ref 96–112)
Creatinine, Ser: 1 mg/dL (ref 0.4–1.2)
GFR: 56.77 mL/min — ABNORMAL LOW
Glucose, Bld: 107 mg/dL — ABNORMAL HIGH (ref 70–99)
Potassium: 4.1 meq/L (ref 3.5–5.1)
Sodium: 134 meq/L — ABNORMAL LOW (ref 135–145)

## 2010-12-04 LAB — LDL CHOLESTEROL, DIRECT: Direct LDL: 130 mg/dL

## 2010-12-05 ENCOUNTER — Encounter: Payer: Self-pay | Admitting: Cardiology

## 2010-12-05 ENCOUNTER — Ambulatory Visit (INDEPENDENT_AMBULATORY_CARE_PROVIDER_SITE_OTHER): Payer: Federal, State, Local not specified - PPO | Admitting: Cardiology

## 2010-12-05 DIAGNOSIS — C50919 Malignant neoplasm of unspecified site of unspecified female breast: Secondary | ICD-10-CM

## 2010-12-05 DIAGNOSIS — E7800 Pure hypercholesterolemia, unspecified: Secondary | ICD-10-CM | POA: Insufficient documentation

## 2010-12-05 DIAGNOSIS — I119 Hypertensive heart disease without heart failure: Secondary | ICD-10-CM

## 2010-12-05 NOTE — Assessment & Plan Note (Signed)
The patient has a history of essential hypertension.  She has been doing well on her present medication.  Since last visit she has lost 5 pounds.  She's not been expressing any chest pain or shortness of breath.  His had no further dizziness or syncope.

## 2010-12-05 NOTE — Assessment & Plan Note (Signed)
Earlier this year the patient had a lumpectomy for breast cancer of the left breast.  This was followed by radiation therapy.

## 2010-12-05 NOTE — Assessment & Plan Note (Signed)
Patient has a past history of dyslipidemia.  Her cholesterol and LDL remain elevated even though she has lost 5 pounds.  We reviewed her diet and she does eat a lot of cheese.  She will cut back on her cheese consumption before next visit.  She is not having any side effects from her present medications.  She was unable to tolerate statins because of myalgias.

## 2010-12-05 NOTE — Progress Notes (Signed)
Monico Blitz Date of Birth:  Jan 08, 1929 [facility] / [facility] [address]   [address] [place], [place]  [address] [phone]           Fax   [phone]  History of Present Illness: This pleasant 77 year old woman is seen for a scheduled followup office visit.  She continues to enjoy good health.  He continues to work full-time for the [employer].  She denies any chest pain or shortness of breath has a history of essential hypertension and a history of hyperlipidemia the she does not have any history of ischemic heart disease.  She had a normal nuclear stress test in 08/27/05.  She's had a past history of mini strokes and she had a echocardiogram in November 2008 including a bubble study which showed no evidence of right to left shunt and she had mild LVH with normal systolic function and with impaired relaxation and mild aortic sclerosis.  The patient has lost 5 pounds since last visit which she attributes to having spent a long time in the hospital while her adult daughter recovered from a serious fall.  Current Outpatient Prescriptions  Medication Sig Dispense Refill  . aspirin 81 MG tablet Take 81 mg by mouth daily.        . Calcium Carb-Cholecalciferol (CALCIUM 1000 + D PO) Take 1,200 tablets by mouth daily.        Marland Kitchen co-enzyme Q-10 30 MG capsule Take 30 mg by mouth 2 (two) times daily.        . cyclobenzaprine (FLEXERIL) 10 MG tablet TAKE 1 TABLET BY MOUTH THREE TIMES DAILY AS NEEDED  30 tablet  1  . fish oil-omega-3 fatty acids 1000 MG capsule Take by mouth 2 (two) times daily.       Marland Kitchen lisinopril (PRINIVIL,ZESTRIL) 20 MG tablet Take 20 mg by mouth 2 (two) times daily.        . metoprolol (LOPRESSOR) 50 MG tablet Take 50 mg by mouth 2 (two) times daily.        . Niacin, Antihyperlipidemic, (NIASPAN PO) Take 100 mg by mouth daily.        Marland Kitchen ZETIA 10 MG tablet TAKE ONE TABLET BY MOUTH DAILY  30 tablet  PRN  . DISCONTD: Doxycycline-Eyelid Cleansers (ALODOX  CONVENIENCE CO) by Combination route.        Marland Kitchen DISCONTD: nitrofurantoin (MACRODANTIN) 100 MG capsule Take 100 mg by mouth daily.        Marland Kitchen DISCONTD: omega-3 acid ethyl esters (LOVAZA) 1 G capsule Take 2 g by mouth 2 (two) times daily.          Allergies  Allergen Reactions  . Codeine   . Morphine And Related   . Penicillins   . Percocet (Oxycodone-Acetaminophen)   . Sulfa Antibiotics     Patient Active Problem List  Diagnoses  . Benign hypertensive heart disease without heart failure  . Hypercholesterolemia  . Breast cancer    History  Smoking status  . Never Smoker   Smokeless tobacco  . Not on file    History  Alcohol Use No    Family History  Problem Relation Age of Onset  . Heart disease Mother   . Hypertension Mother   . Diabetes Father   . Heart disease Father     Review of Systems: Constitutional: no fever chills diaphoresis or fatigue or change in weight.  Head and neck: no hearing loss, no epistaxis, no photophobia or visual disturbance. Respiratory: No  cough, shortness of breath or wheezing. Cardiovascular: No chest pain peripheral edema, palpitations. Gastrointestinal: No abdominal distention, no abdominal pain, no change in bowel habits hematochezia or melena. Genitourinary: No dysuria, no frequency, no urgency, no nocturia. Musculoskeletal:No arthralgias, no back pain, no gait disturbance or myalgias. Neurological: No dizziness, no headaches, no numbness, no seizures, no syncope, no weakness, no tremors. Hematologic: No lymphadenopathy, no easy bruising. Psychiatric: No confusion, no hallucinations, no sleep disturbance.    Physical Exam: Filed Vitals:   12/05/10 1102  BP: 120/76  Pulse: 80  The general appearance reveals a well-developed well-nourished woman who appears younger than her stated age.The head and neck exam reveals pupils equal and reactive.  Extraocular movements are full.  There is no scleral icterus.  The mouth and pharynx are  normal.  The neck is supple.  The carotids reveal no bruits.  The jugular venous pressure is normal.  The  thyroid is not enlarged.  There is no lymphadenopathy.  The chest is clear to percussion and auscultation.  There are no rales or rhonchi.  Expansion of the chest is symmetrical.  The precordium is quiet.  The first heart sound is normal.  The second heart sound is physiologically split.  There is no murmur gallop rub or click.  There is no abnormal lift or heave.  The abdomen is soft and nontender.  The bowel sounds are normal.  The liver and spleen are not enlarged.  There are no abdominal masses.  There are no abdominal bruits.  Extremities reveal good pedal pulses.  There is no phlebitis or edema.  There is no cyanosis or clubbing.  Strength is normal and symmetrical in all extremities.  There is no lateralizing weakness.  There are no sensory deficits.  The skin is warm and dry.  There is no rash.   Assessment / Plan: Continue same medication.  Recheck in 4 months for followup office visit and lab work

## 2011-02-10 ENCOUNTER — Other Ambulatory Visit: Payer: Self-pay | Admitting: Cardiology

## 2011-02-10 DIAGNOSIS — M791 Myalgia, unspecified site: Secondary | ICD-10-CM

## 2011-02-11 NOTE — Telephone Encounter (Signed)
Refilled flexeril 10mg . To [facility]

## 2011-03-25 LAB — POCT I-STAT, CHEM 8
BUN: 20
Calcium, Ion: 1.08 — ABNORMAL LOW
Chloride: 104
Creatinine, Ser: 1.2
Glucose, Bld: 88
HCT: 45
Hemoglobin: 15.3 — ABNORMAL HIGH
Potassium: 4.7
Sodium: 135
TCO2: 25

## 2011-03-25 LAB — CBC
HCT: 41.3
Hemoglobin: 14.1
MCHC: 34.1
MCV: 94.6
Platelets: 189
RBC: 4.37
RDW: 13.4
WBC: 6.4

## 2011-03-25 LAB — DIFFERENTIAL
Basophils Absolute: 0.1
Basophils Relative: 1
Eosinophils Absolute: 0.4
Eosinophils Relative: 6 — ABNORMAL HIGH
Lymphocytes Relative: 50 — ABNORMAL HIGH
Lymphs Abs: 3.2
Monocytes Absolute: 0.8
Monocytes Relative: 13 — ABNORMAL HIGH
Neutro Abs: 1.9
Neutrophils Relative %: 29 — ABNORMAL LOW

## 2011-03-25 LAB — APTT: aPTT: 32

## 2011-03-25 LAB — PROTIME-INR
INR: 0.9
Prothrombin Time: 12.8

## 2011-03-25 LAB — D-DIMER, QUANTITATIVE: D-Dimer, Quant: 0.86 — ABNORMAL HIGH

## 2011-03-27 ENCOUNTER — Ambulatory Visit
Admission: RE | Admit: 2011-03-27 | Discharge: 2011-03-27 | Disposition: A | Discharge status: Home / Self Care | Payer: Federal, State, Local not specified - PPO | Source: Ambulatory Visit | Attending: Radiation Oncology | Admitting: Radiation Oncology

## 2011-03-31 ENCOUNTER — Other Ambulatory Visit: Payer: Self-pay | Admitting: Radiation Oncology

## 2011-03-31 DIAGNOSIS — C50919 Malignant neoplasm of unspecified site of unspecified female breast: Secondary | ICD-10-CM

## 2011-03-31 DIAGNOSIS — Z9889 Other specified postprocedural states: Secondary | ICD-10-CM

## 2011-04-14 LAB — I-STAT 8, (EC8 V) (CONVERTED LAB)
Acid-base deficit: 1
BUN: 21
Bicarbonate: 22.6
Chloride: 107
Glucose, Bld: 146 — ABNORMAL HIGH
HCT: 42
Hemoglobin: 14.3
Operator id: 272551
Potassium: 3.8
Sodium: 137
TCO2: 24
pCO2, Ven: 35.3 — ABNORMAL LOW
pH, Ven: 7.414 — ABNORMAL HIGH

## 2011-04-14 LAB — CARDIAC PANEL(CRET KIN+CKTOT+MB+TROPI)
CK, MB: 1.9
CK, MB: 1.9
Relative Index: 0.8
Relative Index: 0.8
Total CK: 233 — ABNORMAL HIGH
Total CK: 249 — ABNORMAL HIGH
Troponin I: 0.01
Troponin I: 0.01

## 2011-04-14 LAB — LIPID PANEL
Cholesterol: 184
HDL: 35 — ABNORMAL LOW
LDL Cholesterol: 117 — ABNORMAL HIGH
Total CHOL/HDL Ratio: 5.3
Triglycerides: 161 — ABNORMAL HIGH
VLDL: 32

## 2011-04-14 LAB — URINE MICROSCOPIC-ADD ON

## 2011-04-14 LAB — COMPREHENSIVE METABOLIC PANEL
ALT: 27
AST: 21
Albumin: 2.9 — ABNORMAL LOW
Alkaline Phosphatase: 43
BUN: 10
CO2: 25
Calcium: 8.5
Chloride: 105
Creatinine, Ser: 1.03
GFR calc Af Amer: >60
GFR calc non Af Amer: 52 — ABNORMAL LOW
Glucose, Bld: 117 — ABNORMAL HIGH
Potassium: 4.2
Sodium: 138
Total Bilirubin: 0.6
Total Protein: 5.1 — ABNORMAL LOW

## 2011-04-14 LAB — CBC
HCT: 34.3 — ABNORMAL LOW
HCT: 37.7
Hemoglobin: 11.5 — ABNORMAL LOW
Hemoglobin: 12.7
MCHC: 33.6
MCHC: 33.6
MCV: 94.3
MCV: 95
Platelets: 146 — ABNORMAL LOW
Platelets: 175
RBC: 3.62 — ABNORMAL LOW
RBC: 4
RDW: 13.7
RDW: 13.9
WBC: 8.2
WBC: 9.9

## 2011-04-14 LAB — URINALYSIS, ROUTINE W REFLEX MICROSCOPIC
Bilirubin Urine: NEGATIVE
Glucose, UA: NEGATIVE
Hgb urine dipstick: NEGATIVE
Ketones, ur: NEGATIVE
Nitrite: NEGATIVE
Protein, ur: NEGATIVE
Specific Gravity, Urine: 1.023
Urobilinogen, UA: 0.2
pH: 6

## 2011-04-14 LAB — DIFFERENTIAL
Basophils Absolute: 0
Basophils Relative: 0
Eosinophils Absolute: 0.1
Eosinophils Relative: 1
Lymphocytes Relative: 20
Lymphs Abs: 2
Monocytes Absolute: 0.7
Monocytes Relative: 7
Neutro Abs: 7.1
Neutrophils Relative %: 72

## 2011-04-14 LAB — POCT CARDIAC MARKERS
CKMB, poc: 1.2
Myoglobin, poc: 68.4
Operator id: 272551
Troponin i, poc: <0.05

## 2011-04-14 LAB — PROTIME-INR
INR: 0.9
Prothrombin Time: 12.7

## 2011-04-14 LAB — APTT: aPTT: 27

## 2011-04-14 LAB — VITAMIN B12: Vitamin B-12: 1320 — ABNORMAL HIGH (ref 211–911)

## 2011-04-14 LAB — POCT I-STAT CREATININE
Creatinine, Ser: 1
Operator id: 272551

## 2011-04-14 LAB — HEMOGLOBIN A1C: Hgb A1c MFr Bld: 6.2 — ABNORMAL HIGH

## 2011-04-14 LAB — FOLATE: Folate: >20

## 2011-04-14 LAB — TSH: TSH: 4.531

## 2011-04-16 LAB — I-STAT 8, (EC8 V) (CONVERTED LAB)
Acid-base deficit: 1
BUN: 27 — ABNORMAL HIGH
Bicarbonate: 24.4 — ABNORMAL HIGH
Chloride: 107
Glucose, Bld: 106 — ABNORMAL HIGH
HCT: 46
Hemoglobin: 15.6 — ABNORMAL HIGH
Operator id: 277751
Potassium: 3.9
Sodium: 136
TCO2: 26
pCO2, Ven: 44.3 — ABNORMAL LOW
pH, Ven: 7.35 — ABNORMAL HIGH

## 2011-04-16 LAB — POCT I-STAT CREATININE
Creatinine, Ser: 1.2
Operator id: 277751

## 2011-05-08 ENCOUNTER — Other Ambulatory Visit: Payer: Self-pay | Admitting: *Deleted

## 2011-05-08 DIAGNOSIS — E7800 Pure hypercholesterolemia, unspecified: Secondary | ICD-10-CM

## 2011-05-14 ENCOUNTER — Other Ambulatory Visit: Payer: Self-pay | Admitting: Cardiology

## 2011-05-14 ENCOUNTER — Other Ambulatory Visit (INDEPENDENT_AMBULATORY_CARE_PROVIDER_SITE_OTHER): Payer: Federal, State, Local not specified - PPO | Admitting: *Deleted

## 2011-05-14 DIAGNOSIS — E7800 Pure hypercholesterolemia, unspecified: Secondary | ICD-10-CM

## 2011-05-14 LAB — HEPATIC FUNCTION PANEL
ALT: 36 U/L — ABNORMAL HIGH (ref 0–35)
AST: 29 U/L (ref 0–37)
Albumin: 3.8 g/dL (ref 3.5–5.2)
Alkaline Phosphatase: 57 U/L (ref 39–117)
Bilirubin, Direct: 0 mg/dL (ref 0.0–0.3)
Total Bilirubin: 0.6 mg/dL (ref 0.3–1.2)
Total Protein: 6.8 g/dL (ref 6.0–8.3)

## 2011-05-14 LAB — BASIC METABOLIC PANEL
BUN: 23 mg/dL (ref 6–23)
CO2: 28 mEq/L (ref 19–32)
Calcium: 9.5 mg/dL (ref 8.4–10.5)
Chloride: 104 mEq/L (ref 96–112)
Creatinine, Ser: 1 mg/dL (ref 0.4–1.2)
GFR: 60.16 mL/min (ref >60.00)
Glucose, Bld: 91 mg/dL (ref 70–99)
Potassium: 4.6 mEq/L (ref 3.5–5.1)
Sodium: 139 mEq/L (ref 135–145)

## 2011-05-14 LAB — LIPID PANEL
Cholesterol: 224 mg/dL — ABNORMAL HIGH (ref 0–200)
HDL: 58.1 mg/dL (ref >39.00)
Total CHOL/HDL Ratio: 4
Triglycerides: 117 mg/dL (ref 0.0–149.0)
VLDL: 23.4 mg/dL (ref 0.0–40.0)

## 2011-05-14 LAB — LDL CHOLESTEROL, DIRECT: Direct LDL: 167.7 mg/dL

## 2011-05-15 ENCOUNTER — Encounter: Payer: Self-pay | Admitting: *Deleted

## 2011-05-18 ENCOUNTER — Encounter: Payer: Self-pay | Admitting: Cardiology

## 2011-05-18 ENCOUNTER — Ambulatory Visit (INDEPENDENT_AMBULATORY_CARE_PROVIDER_SITE_OTHER): Payer: Federal, State, Local not specified - PPO | Admitting: Cardiology

## 2011-05-18 VITALS — BP 120/74 | HR 66 | Ht 62.0 in | Wt 155.0 lb

## 2011-05-18 DIAGNOSIS — E7800 Pure hypercholesterolemia, unspecified: Secondary | ICD-10-CM

## 2011-05-18 DIAGNOSIS — I119 Hypertensive heart disease without heart failure: Secondary | ICD-10-CM

## 2011-05-18 DIAGNOSIS — R7301 Impaired fasting glucose: Secondary | ICD-10-CM

## 2011-05-18 NOTE — Patient Instructions (Signed)
Watch your diet  Your physician recommends that you continue on your current medications as directed. Please refer to the Current Medication list given to you today. Your physician recommends that you schedule a follow-up appointment in: 4 months with fasting labs (lp/bmet/hfp)

## 2011-05-18 NOTE — Assessment & Plan Note (Signed)
The patient has a history of hypercholesterolemia and is on Zetia.  She was not able to tolerate statin therapy.  Her LDL level remains high but she does eat a lot of cheese.. we will have her cut back on dairy products before next visit

## 2011-05-18 NOTE — Assessment & Plan Note (Signed)
No headaches.  No dizzy spells.  No symptoms of congestive heart failure.  No side effects from her blood pressure meds

## 2011-05-18 NOTE — Assessment & Plan Note (Signed)
Her blood sugars remain mildly elevated.  She is going to work harder on diet and weight loss.

## 2011-05-18 NOTE — Progress Notes (Signed)
Shannon Serrano Date of Birth:  [birth year] [facility] [address]   [address] [place], [place]  [address] [phone]           Fax   [phone]  History of Present Illness: This pleasant elderly Caucasian female just turned 77 years old yesterday.  She still works full-time in the [employer] office.  She has been feeling well.  He does note more fatigued by the end of the day.  She is not having any chest pain or shortness of breath.  She has a past history of essential hypertension and hypercholesterolemia.  Current Outpatient Prescriptions  Medication Sig Dispense Refill  . aspirin 81 MG tablet Take 81 mg by mouth daily.        . Calcium Carb-Cholecalciferol (CALCIUM 1000 + D PO) Take 1,200 tablets by mouth daily.        Marland Kitchen co-enzyme Q-10 30 MG capsule Take 30 mg by mouth daily.       . cyclobenzaprine (FLEXERIL) 10 MG tablet TAKE 1 TABLET BY MOUTH THREE TIMES DAILY AS NEEDED  30 tablet  3  . fish oil-omega-3 fatty acids 1000 MG capsule Take by mouth 2 (two) times daily.       Marland Kitchen lisinopril (PRINIVIL,ZESTRIL) 20 MG tablet Take 20 mg by mouth 2 (two) times daily.        . metoprolol (LOPRESSOR) 50 MG tablet Take 50 mg by mouth 2 (two) times daily.        Marland Kitchen ZETIA 10 MG tablet TAKE ONE TABLET BY MOUTH DAILY  30 tablet  PRN  . ciprofloxacin (CIPRO) 500 MG tablet Take 500 mg by mouth as needed.      . nitrofurantoin (MACRODANTIN) 50 MG capsule Take 50 mg by mouth as needed.      . valACYclovir (VALTREX) 500 MG tablet Take 500 mg by mouth as needed. Dr. Solon Augusta        Allergies  Allergen Reactions  . Codeine   . Morphine And Related   . Penicillins   . Percocet (Oxycodone-Acetaminophen)   . Sulfa Antibiotics     Patient Active Problem List  Diagnoses  . Benign hypertensive heart disease without heart failure  . Hypercholesterolemia  . Breast cancer    History  Smoking status  . Never Smoker   Smokeless tobacco  . Not on file    History  Alcohol  Use No    Family History  Problem Relation Age of Onset  . Heart disease Mother   . Hypertension Mother   . Diabetes Father   . Heart disease Father     Review of Systems: Constitutional: no fever chills diaphoresis or fatigue or change in weight.  Head and neck: no hearing loss, no epistaxis, no photophobia or visual disturbance. Respiratory: No cough, shortness of breath or wheezing. Cardiovascular: No chest pain peripheral edema, palpitations. Gastrointestinal: No abdominal distention, no abdominal pain, no change in bowel habits hematochezia or melena. Genitourinary: No dysuria, no frequency, no urgency, no nocturia. Musculoskeletal:No arthralgias, no back pain, no gait disturbance or myalgias. Neurological: No dizziness, no headaches, no numbness, no seizures, no syncope, no weakness, no tremors. Hematologic: No lymphadenopathy, no easy bruising. Psychiatric: No confusion, no hallucinations, no sleep disturbance.    Physical Exam: Filed Vitals:   05/18/11 1619  BP: 120/74  Pulse: 66   the general appearance reveals a well-developed well-nourished woman in no distress.Pupils equal and reactive.   Extraocular Movements are full.  There  is no scleral icterus.  The mouth and pharynx are normal.  The neck is supple.  The carotids reveal no bruits.  The jugular venous pressure is normal.  The thyroid is not enlarged.  There is no lymphadenopathy.  The chest is clear to percussion and auscultation. There are no rales or rhonchi. Expansion of the chest is symmetrical.  The precordium is quiet.  The first heart sound is normal.  The second heart sound is physiologically split.  There is no murmur gallop rub or click.  There is no abnormal lift or heave.  The abdomen is soft and nontender. Bowel sounds are normal. The liver and spleen are not enlarged. There Are no abdominal masses. There are no bruits.  Extremities reveal no phlebitis or edema.Strength is normal and symmetrical in  all extremities.  There is no lateralizing weakness.  There are no sensory deficits.  Integument reveals a small lesion on the dorsal surface of the left foot which she is seeing her dermatologist about.   Assessment / Plan:  Continue same medication.  Cut back further on cheese and dairy products.  Recheck 4 months for office visit and fasting lab work

## 2011-06-09 ENCOUNTER — Other Ambulatory Visit: Payer: Self-pay | Admitting: Cardiology

## 2011-06-10 ENCOUNTER — Ambulatory Visit
Admission: RE | Admit: 2011-06-10 | Discharge: 2011-06-10 | Disposition: A | Discharge status: Home / Self Care | Payer: Federal, State, Local not specified - PPO | Source: Ambulatory Visit | Attending: Radiation Oncology | Admitting: Radiation Oncology

## 2011-06-10 DIAGNOSIS — C50919 Malignant neoplasm of unspecified site of unspecified female breast: Secondary | ICD-10-CM

## 2011-06-10 DIAGNOSIS — Z9889 Other specified postprocedural states: Secondary | ICD-10-CM

## 2011-06-10 NOTE — Telephone Encounter (Signed)
Refill flexeril.

## 2011-06-26 ENCOUNTER — Ambulatory Visit: Payer: Federal, State, Local not specified - PPO | Admitting: Radiation Oncology

## 2011-07-02 ENCOUNTER — Encounter: Payer: Self-pay | Admitting: *Deleted

## 2011-07-03 ENCOUNTER — Encounter: Payer: Self-pay | Admitting: Radiation Oncology

## 2011-07-03 ENCOUNTER — Ambulatory Visit
Admission: RE | Admit: 2011-07-03 | Discharge: 2011-07-03 | Disposition: A | Discharge status: Home / Self Care | Payer: Federal, State, Local not specified - PPO | Source: Ambulatory Visit | Attending: Radiation Oncology | Admitting: Radiation Oncology

## 2011-07-03 VITALS — BP 102/59 | HR 73 | Temp 96.8°F | Resp 18 | Wt 155.0 lb

## 2011-07-03 DIAGNOSIS — C50919 Malignant neoplasm of unspecified site of unspecified female breast: Secondary | ICD-10-CM

## 2011-07-03 NOTE — Progress Notes (Signed)
SKIN LOOKS GREAT

## 2011-07-03 NOTE — Progress Notes (Signed)
NO C/O TODAY AS FAR AS BREAST BUT DOES HAVE LOW BP AND SAYS SHE IS LIGHT HEADED.  I WROTE DOWN HER VS AND TOLD HER TO CALL HER PRIMARY SINCE HE HAS HER ON BP MEDS

## 2011-07-03 NOTE — Progress Notes (Signed)
Encounter addended by: Buckner Malta, MD on: 07/03/2011 12:44 PM<BR>     Documentation filed: Normajean Glasgow VN

## 2011-07-03 NOTE — Progress Notes (Signed)
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 aspirin 81 MG tablet Take 81 mg by mouth daily.        . Calcium Carb-Cholecalciferol (CALCIUM 1000 + D PO) Take 1,200 tablets by mouth daily.        . ciprofloxacin (CIPRO) 500 MG tablet Take 500 mg by mouth as needed.      Marland Kitchen co-enzyme Q-10 30 MG capsule Take 30 mg by mouth daily.       . cyclobenzaprine (FLEXERIL) 10 MG tablet TAKE 1 TABLET BY MOUTH THREE TIMES DAILY AS NEEDED  30 tablet  5  . fish oil-omega-3 fatty acids 1000 MG capsule Take by mouth 2 (two) times daily.       Marland Kitchen lisinopril (PRINIVIL,ZESTRIL) 20 MG tablet Take 20 mg by mouth 2 (two) times daily.        . metoprolol (LOPRESSOR) 50 MG tablet Take 50 mg by mouth 2 (two) times daily.        . nitrofurantoin (MACRODANTIN) 50 MG capsule Take 50 mg by mouth as needed.      . valACYclovir (VALTREX) 500 MG tablet Take 500 mg by mouth as needed. Dr. Solon Augusta      . ZETIA 10 MG tablet TAKE ONE TABLET BY MOUTH DAILY  30 tablet  PRN     NARRATIVE:  The patient is doing well.  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  She has not noticed any new lumps or bumps in her breasts.  She had a mammogram in December, bilateral. It was BIRADS 2.  She is a little light headed today and her blood pressure is a little low; her PO intake has been good.   PHYSICAL EXAM:   weight is 155 lb (70.308 kg). Her oral temperature is 96.8 F (36 C). Her blood pressure is 102/59 and her pulse is 73. Her respiration is 18.  Her supraclavicular fossa is free of  any palpable adenopathy. Her breasts demonstrate no concerning lesions bilaterally. The axillary regions are clear to palpation as well. She has a little bit of scar tissue that is palpable underneath the lateral lumpectomy scar in her left breast. It is less obvious than at her previous exam and feels benign.   LABORATORY DATA:  Lab Results  Component Value Date   WBC 5.2 09/29/2010   HGB 12.7 09/29/2010   HCT 37.3 09/29/2010   MCV 95.7 09/29/2010   PLT 174 09/29/2010   Lab Results  Component Value Date   NA 139 05/14/2011   K 4.6 05/14/2011   CL 104 05/14/2011   CO2 28 05/14/2011   Lab Results  Component Value Date   ALT 36* 05/14/2011   AST 29 05/14/2011   ALKPHOS 57 05/14/2011   BILITOT 0.6 05/14/2011       RADIOGRAPHIC STUDIES:   Mammography as above    IMPRESSION/PLAN: She is doing very well without evidence of disease. I will see her back in approximately 6 months. She also will be seeing Dr. Welton Flakes later in the year. She  has no scheduled followup with Dr. Jamey Ripa. I advised her to call her PCP and let her PCP know that she has had some lightheadedness. We wrote down her blood pressure so that she can report it to her PCP and see if any of her blood pressure medications need to be adjusted.  It was a pleasure seeing the patient and I have encouraged her to call me if she has any questions or concerns in the interim.

## 2011-07-03 NOTE — Progress Notes (Signed)
Encounter addended by: Buckner Malta, MD on: 07/03/2011  3:58 PM<BR>     Documentation filed: Flowsheet VN, Orders

## 2011-07-07 ENCOUNTER — Other Ambulatory Visit: Payer: Self-pay | Admitting: Oncology

## 2011-07-07 DIAGNOSIS — C50919 Malignant neoplasm of unspecified site of unspecified female breast: Secondary | ICD-10-CM

## 2011-07-08 ENCOUNTER — Telehealth: Payer: Self-pay | Admitting: Oncology

## 2011-07-08 NOTE — Telephone Encounter (Signed)
called pt s/w husband and provided appt for [phone]

## 2011-07-31 ENCOUNTER — Other Ambulatory Visit: Payer: Self-pay | Admitting: Cardiology

## 2011-07-31 NOTE — Telephone Encounter (Signed)
Refilled lisinopril

## 2011-09-01 ENCOUNTER — Ambulatory Visit (HOSPITAL_BASED_OUTPATIENT_CLINIC_OR_DEPARTMENT_OTHER): Payer: Federal, State, Local not specified - PPO | Admitting: Family

## 2011-09-01 ENCOUNTER — Other Ambulatory Visit (HOSPITAL_BASED_OUTPATIENT_CLINIC_OR_DEPARTMENT_OTHER): Payer: Federal, State, Local not specified - PPO | Admitting: Lab

## 2011-09-01 ENCOUNTER — Telehealth: Payer: Self-pay | Admitting: Oncology

## 2011-09-01 VITALS — BP 130/76 | HR 62 | Temp 97.6°F | Ht 62.0 in | Wt 156.2 lb

## 2011-09-01 DIAGNOSIS — C50919 Malignant neoplasm of unspecified site of unspecified female breast: Secondary | ICD-10-CM

## 2011-09-01 DIAGNOSIS — D0590 Unspecified type of carcinoma in situ of unspecified breast: Secondary | ICD-10-CM

## 2011-09-01 DIAGNOSIS — E559 Vitamin D deficiency, unspecified: Secondary | ICD-10-CM

## 2011-09-01 DIAGNOSIS — D0510 Intraductal carcinoma in situ of unspecified breast: Secondary | ICD-10-CM

## 2011-09-01 LAB — COMPREHENSIVE METABOLIC PANEL
ALT: 35 U/L (ref 0–35)
AST: 29 U/L (ref 0–37)
Albumin: 4 g/dL (ref 3.5–5.2)
Alkaline Phosphatase: 58 U/L (ref 39–117)
BUN: 15 mg/dL (ref 6–23)
CO2: 25 mEq/L (ref 19–32)
Calcium: 9.7 mg/dL (ref 8.4–10.5)
Chloride: 103 mEq/L (ref 96–112)
Creatinine, Ser: 0.92 mg/dL (ref 0.50–1.10)
Glucose, Bld: 104 mg/dL — ABNORMAL HIGH (ref 70–99)
Potassium: 4.7 mEq/L (ref 3.5–5.3)
Sodium: 137 mEq/L (ref 135–145)
Total Bilirubin: 0.4 mg/dL (ref 0.3–1.2)
Total Protein: 6.3 g/dL (ref 6.0–8.3)

## 2011-09-01 LAB — CBC WITH DIFFERENTIAL/PLATELET
BASO%: 1.4 % (ref 0.0–2.0)
Basophils Absolute: 0.1 10*3/uL (ref 0.0–0.1)
EOS%: 6.8 % (ref 0.0–7.0)
Eosinophils Absolute: 0.5 10*3/uL (ref 0.0–0.5)
HCT: 39.1 % (ref 34.8–46.6)
HGB: 13.3 g/dL (ref 11.6–15.9)
LYMPH%: 30.4 % (ref 14.0–49.7)
MCH: 32.3 pg (ref 25.1–34.0)
MCHC: 34 g/dL (ref 31.5–36.0)
MCV: 95.1 fL (ref 79.5–101.0)
MONO#: 0.7 10*3/uL (ref 0.1–0.9)
MONO%: 10.6 % (ref 0.0–14.0)
NEUT#: 3.6 10*3/uL (ref 1.5–6.5)
NEUT%: 50.8 % (ref 38.4–76.8)
Platelets: 228 10*3/uL (ref 145–400)
RBC: 4.11 10*6/uL (ref 3.70–5.45)
RDW: 13.7 % (ref 11.2–14.5)
WBC: 7 10*3/uL (ref 3.9–10.3)
lymph#: 2.1 10*3/uL (ref 0.9–3.3)

## 2011-09-01 LAB — VITAMIN D 25 HYDROXY (VIT D DEFICIENCY, FRACTURES): Vit D, 25-Hydroxy: 49 ng/mL (ref 30–89)

## 2011-09-01 NOTE — Telephone Encounter (Signed)
gve the pt her sept 2013 appt calendar °

## 2011-09-02 ENCOUNTER — Encounter: Payer: Self-pay | Admitting: Family

## 2011-09-02 NOTE — Progress Notes (Signed)
[facility]  Name: Shannon Serrano                  DATE: 09/02/2011 MRN: [id number]                      DOB: 1929/04/04  REFERRING PHYSICIAN: Cassell Clement, MD  DIAGNOSIS: Patient Active Problem List  Diagnoses Date Noted  . Fasting hyperglycemia 05/18/2011  . Benign hypertensive heart disease without heart failure 12/05/2010  . Hypercholesterolemia 12/05/2010  . Ductal carcinoma in situ of breast 06/03/2010     Encounter Diagnosis  Name Primary?  . Ductal carcinoma in situ of breast Yes    PREVIOUS THERAPY: Left lumpectomy 06/18/2010, high-grade ductal carcinoma in situ, ER positive 99%, PR positive 92%.  All margins negative.   CURRENT THERAPY: Surveillance.  INTERIM HISTORY:  Patient has been lost to followup, now returns to establish routine surveillance. She is not taking anti-estrogen therapy at the recommendation of cardiology and primary care, due to history of mini strokes. last mammogram 06/10/2011, no evidence of malignancy. No self detected breast problems or complaints. Chief complaint today is deterioration in vision, no cough or shortness of breath, no abdominal pain, no bone pain. Bowel and bladder function are normal, appetite is good.   PHYSICAL EXAM: BP 130/76  Pulse 62  Temp 97.6 F (36.4 C)  Ht 5\' 2"  (1.575 m)  Wt 156 lb 3.2 oz (70.852 kg)  BMI 28.57 kg/m2 General: Well developed, well nourished, in no acute distress.  EENT: No ocular or oral lesions. No stomatitis.  Respiratory: Lungs are clear to auscultation bilaterally with normal respiratory movement and no accessory muscle use. Cardiac: No murmur, rub or tachycardia. No upper or lower extremity edema.  GI: Abdomen is soft, no palpable hepatosplenomegaly. No fluid wave. No tenderness. Musculoskeletal: No kyphosis, no tenderness over the spine, ribs or hips. Lymph: No cervical, infraclavicular, axillary or inguinal adenopathy. Neuro: No focal neurological deficits. Psych: Alert and  oriented X 3, appropriate mood and affect.  BREAST EXAM: In the supine position, with the right arm over the head, the right nipple is everted. No periareolar edema or nipple discharge. No mass in any quadrant or subareolar region. No redness of the skin. No right axillary adenopathy. With the left arm over the head, the left nipple is everted. No periareolar edema or nipple discharge. At the areola margin, a fine, almost imperceptible scar at the 3 to 6:00 position. No mass in any quadrant or subareolar region. No redness of the skin. No left axillary adenopathy. Scattered cherry hemangioma on bilateral breasts.    LABORATORY STUDIES:   Results for orders placed in visit on 09/01/11  CBC WITH DIFFERENTIAL      Component Value Range   WBC 7.0  3.9 - 10.3 (10e3/uL)   NEUT# 3.6  1.5 - 6.5 (10e3/uL)   HGB 13.3  11.6 - 15.9 (g/dL)   HCT 11.9  14.7 - 82.9 (%)   Platelets 228  145 - 400 (10e3/uL)   MCV 95.1  79.5 - 101.0 (fL)   MCH 32.3  25.1 - 34.0 (pg)   MCHC 34.0  31.5 - 36.0 (g/dL)   RBC 5.62  1.30 - 8.65 (10e6/uL)   RDW 13.7  11.2 - 14.5 (%)   lymph# 2.1  0.9 - 3.3 (10e3/uL)   MONO# 0.7  0.1 - 0.9 (10e3/uL)   Eosinophils Absolute 0.5  0.0 - 0.5 (10e3/uL)   Basophils Absolute 0.1  0.0 -  0.1 (10e3/uL)   NEUT% 50.8  38.4 - 76.8 (%)   LYMPH% 30.4  14.0 - 49.7 (%)   MONO% 10.6  0.0 - 14.0 (%)   EOS% 6.8  0.0 - 7.0 (%)   BASO% 1.4  0.0 - 2.0 (%)  COMPREHENSIVE METABOLIC PANEL      Component Value Range   Sodium 137  135 - 145 (mEq/L)   Potassium 4.7  3.5 - 5.3 (mEq/L)   Chloride 103  96 - 112 (mEq/L)   CO2 25  19 - 32 (mEq/L)   Glucose, Bld 104 (*) 70 - 99 (mg/dL)   BUN 15  6 - 23 (mg/dL)   Creatinine, Ser 1.61  0.50 - 1.10 (mg/dL)   Total Bilirubin 0.4  0.3 - 1.2 (mg/dL)   Alkaline Phosphatase 58  39 - 117 (U/L)   AST 29  0 - 37 (U/L)   ALT 35  0 - 35 (U/L)   Total Protein 6.3  6.0 - 8.3 (g/dL)   Albumin 4.0  3.5 - 5.2 (g/dL)   Calcium 9.7  8.4 - 09.6 (mg/dL)  VITAMIN D 25  HYDROXY      Component Value Range   Vit D, 25-Hydroxy 49  30 - 89 (ng/mL)    IMPRESSION:   78 year old female with: 1.  History DCIS, December 2011. Post lumpectomy and radiation therapy. No evidence of recurrence on mammogram 06/10/2011. 2. Declined antiestrogen therapy due to history of mini strokes.  PLAN:   1. Following NCCN guidelines for DCIS, we will see her every 6 months to complete 5 years, then yearly thereafter.

## 2011-09-11 ENCOUNTER — Other Ambulatory Visit: Payer: Federal, State, Local not specified - PPO

## 2011-09-14 ENCOUNTER — Other Ambulatory Visit: Payer: Federal, State, Local not specified - PPO

## 2011-09-15 ENCOUNTER — Other Ambulatory Visit: Payer: Self-pay | Admitting: Cardiology

## 2011-09-17 ENCOUNTER — Other Ambulatory Visit (INDEPENDENT_AMBULATORY_CARE_PROVIDER_SITE_OTHER): Payer: Federal, State, Local not specified - PPO

## 2011-09-17 ENCOUNTER — Ambulatory Visit (INDEPENDENT_AMBULATORY_CARE_PROVIDER_SITE_OTHER): Payer: Federal, State, Local not specified - PPO | Admitting: Cardiology

## 2011-09-17 ENCOUNTER — Encounter: Payer: Self-pay | Admitting: Cardiology

## 2011-09-17 VITALS — BP 130/80 | HR 80 | Ht 61.0 in | Wt 152.0 lb

## 2011-09-17 DIAGNOSIS — Z79899 Other long term (current) drug therapy: Secondary | ICD-10-CM

## 2011-09-17 DIAGNOSIS — E7800 Pure hypercholesterolemia, unspecified: Secondary | ICD-10-CM

## 2011-09-17 DIAGNOSIS — R7301 Impaired fasting glucose: Secondary | ICD-10-CM

## 2011-09-17 DIAGNOSIS — I119 Hypertensive heart disease without heart failure: Secondary | ICD-10-CM

## 2011-09-17 LAB — BASIC METABOLIC PANEL
BUN: 17 mg/dL (ref 6–23)
CO2: 26 mEq/L (ref 19–32)
Calcium: 9 mg/dL (ref 8.4–10.5)
Chloride: 102 mEq/L (ref 96–112)
Creatinine, Ser: 1.1 mg/dL (ref 0.4–1.2)
GFR: 52.4 mL/min — ABNORMAL LOW (ref >60.00)
Glucose, Bld: 108 mg/dL — ABNORMAL HIGH (ref 70–99)
Potassium: 4.2 mEq/L (ref 3.5–5.1)
Sodium: 136 mEq/L (ref 135–145)

## 2011-09-17 LAB — LIPID PANEL
Cholesterol: 175 mg/dL (ref 0–200)
HDL: 56.2 mg/dL (ref >39.00)
LDL Cholesterol: 92 mg/dL (ref 0–99)
Total CHOL/HDL Ratio: 3
Triglycerides: 133 mg/dL (ref 0.0–149.0)
VLDL: 26.6 mg/dL (ref 0.0–40.0)

## 2011-09-17 LAB — HEPATIC FUNCTION PANEL
ALT: 34 U/L (ref 0–35)
AST: 30 U/L (ref 0–37)
Albumin: 3.8 g/dL (ref 3.5–5.2)
Alkaline Phosphatase: 49 U/L (ref 39–117)
Bilirubin, Direct: 0.1 mg/dL (ref 0.0–0.3)
Total Bilirubin: 0.6 mg/dL (ref 0.3–1.2)
Total Protein: 6.6 g/dL (ref 6.0–8.3)

## 2011-09-17 NOTE — Patient Instructions (Signed)
Your physician recommends that you continue on your current medications as directed. Please refer to the Current Medication list given to you today. Your physician wants you to follow-up in: 4 months You will receive a reminder letter in the mail two months in advance. If you don't receive a letter, please call our office to schedule the follow-up appointment.  

## 2011-09-17 NOTE — Assessment & Plan Note (Signed)
The patient has not been expressing any chest pain or shortness of breath or palpitations 

## 2011-09-17 NOTE — Progress Notes (Signed)
Quick Note:  Please make copy of labs for patient visit. ______ 

## 2011-09-17 NOTE — Assessment & Plan Note (Signed)
The patient continues to have mild fasting hyperglycemia.  Her weight is down 3 pounds since last visit.  She continues to walk daily for exercise.

## 2011-09-17 NOTE — Progress Notes (Signed)
Monico Blitz Date of Birth:  [birth year] [facility] [address] [place], [place]  [address] [phone]         Fax   [phone]  History of Present Illness: This pleasant 78 year old woman is seen for a scheduled followup office visit.  She has a history of essential hypertension and a history of hypercholesterolemia.  She also has slightly elevated blood sugars.  She's had a past history of breast cancer.  She does not have any history of known ischemic heart disease and she had a normal nuclear stress test in 2007.  She had an echocardiogram in 2008 4 questionable TIA and had a negative bubble study at that time and had mild LVH with normal systolic function and with impaired relaxation.  Current Outpatient Prescriptions  Medication Sig Dispense Refill  . aspirin 81 MG tablet Take 81 mg by mouth daily.        . Calcium Carb-Cholecalciferol (CALCIUM 1000 + D PO) Take 1,200 tablets by mouth daily.        . ciprofloxacin (CIPRO) 500 MG tablet Take 500 mg by mouth as needed.      Marland Kitchen co-enzyme Q-10 30 MG capsule Take 30 mg by mouth daily.       . cyclobenzaprine (FLEXERIL) 10 MG tablet TAKE 1 TABLET BY MOUTH THREE TIMES DAILY AS NEEDED  30 tablet  5  . fish oil-omega-3 fatty acids 1000 MG capsule Take by mouth 2 (two) times daily.       Marland Kitchen lisinopril (PRINIVIL,ZESTRIL) 20 MG tablet       . metoprolol (LOPRESSOR) 50 MG tablet TAKE 1 TABLET BY MOUTH TWICE DAILY  60 tablet  5  . nitrofurantoin (MACRODANTIN) 50 MG capsule Take 50 mg by mouth as needed.      . valACYclovir (VALTREX) 500 MG tablet Take 500 mg by mouth as needed. Dr. Solon Augusta      . ZETIA 10 MG tablet TAKE ONE TABLET BY MOUTH DAILY  30 tablet  PRN    Allergies  Allergen Reactions  . Codeine   . Morphine And Related   . Penicillins   . Percocet (Oxycodone-Acetaminophen)   . Sulfa Antibiotics     Patient Active Problem List  Diagnoses  . Benign hypertensive heart disease without heart failure  .  Hypercholesterolemia  . Fasting hyperglycemia  . Ductal carcinoma in situ of breast    History  Smoking status  . Never Smoker   Smokeless tobacco  . Not on file    History  Alcohol Use No    Family History  Problem Relation Age of Onset  . Heart disease Mother   . Hypertension Mother   . Diabetes Father   . Heart disease Father   . Colon cancer Sister   . Pancreatic cancer Brother   . Pancreatic cancer Sister   . Brain cancer Brother   . Heart attack Sister   . Uterine cancer Sister     Review of Systems: Constitutional: no fever chills diaphoresis or fatigue or change in weight.  Head and neck: no hearing loss, no epistaxis, no photophobia or visual disturbance. Respiratory: No cough, shortness of breath or wheezing. Cardiovascular: No chest pain peripheral edema, palpitations. Gastrointestinal: No abdominal distention, no abdominal pain, no change in bowel habits hematochezia or melena. Genitourinary: No dysuria, no frequency, no urgency, no nocturia. Musculoskeletal:No arthralgias, no back pain, no gait disturbance or myalgias. Neurological: No dizziness, no headaches, no numbness, no seizures, no syncope, no  weakness, no tremors. Hematologic: No lymphadenopathy, no easy bruising. Psychiatric: No confusion, no hallucinations, no sleep disturbance.    Physical Exam: Filed Vitals:   09/17/11 1630  BP: 130/80  Pulse: 80   the general appearance reveals a well-developed well-nourished woman who appears younger than her stated age.The head and neck exam reveals pupils equal and reactive.  Extraocular movements are full.  There is no scleral icterus.  The mouth and pharynx are normal.  The neck is supple.  The carotids reveal no bruits.  The jugular venous pressure is normal.  The  thyroid is not enlarged.  There is no lymphadenopathy.  The chest is clear to percussion and auscultation.  There are no rales or rhonchi.  Expansion of the chest is symmetrical.  The  precordium is quiet.  The first heart sound is normal.  The second heart sound is physiologically split.  There is no murmur gallop rub or click.  There is no abnormal lift or heave.  The abdomen is soft and nontender.  The bowel sounds are normal.  The liver and spleen are not enlarged.  There are no abdominal masses.  There are no abdominal bruits.  Extremities reveal good pedal pulses.  There is no phlebitis or edema.  There is no cyanosis or clubbing.  Strength is normal and symmetrical in all extremities.  There is no lateralizing weakness.  There are no sensory deficits.  The skin is warm and dry.  There is no rash.     Assessment / Plan: Continue same medication.  Recheck in 4 months for followup office visit and fasting lab work

## 2011-09-17 NOTE — Assessment & Plan Note (Signed)
The patient is intolerant of statins.  She is on ezetimibe which she is tolerating okay and her lipids are satisfactory today.

## 2011-11-23 ENCOUNTER — Other Ambulatory Visit: Payer: Self-pay | Admitting: Cardiology

## 2011-12-22 ENCOUNTER — Other Ambulatory Visit: Payer: Self-pay | Admitting: Cardiology

## 2011-12-23 NOTE — Telephone Encounter (Signed)
Refill on cyclobenzaprine

## 2012-01-07 ENCOUNTER — Encounter: Payer: Self-pay | Admitting: Radiation Oncology

## 2012-01-08 ENCOUNTER — Ambulatory Visit
Admission: RE | Admit: 2012-01-08 | Discharge: 2012-01-08 | Disposition: A | Discharge status: Home / Self Care | Payer: Federal, State, Local not specified - PPO | Source: Ambulatory Visit | Attending: Radiation Oncology | Admitting: Radiation Oncology

## 2012-01-08 ENCOUNTER — Encounter: Payer: Self-pay | Admitting: Radiation Oncology

## 2012-01-08 VITALS — BP 130/78 | HR 61 | Temp 96.8°F | Resp 20 | Wt 159.5 lb

## 2012-01-08 DIAGNOSIS — C50419 Malignant neoplasm of upper-outer quadrant of unspecified female breast: Secondary | ICD-10-CM

## 2012-01-08 DIAGNOSIS — D0510 Intraductal carcinoma in situ of unspecified breast: Secondary | ICD-10-CM

## 2012-01-08 HISTORY — DX: Intraductal carcinoma in situ of unspecified breast: D05.10

## 2012-01-08 NOTE — Progress Notes (Signed)
  Radiation Oncology         [phone] ________________________________  Name: Shannon Serrano MRN: [id number]  Date: 01/08/2012  DOB: 1929-05-06  Follow-Up Visit Note  Diagnosis:  High-grade DCIS of the left breast  Interval Since Last Radiation:  The patient completed 42.56 Gy in 16 fractions on 08/11/2010  Narrative:  The patient returns today for routine follow-up.  Doing well. 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        She denies any new lumps or bumps in her breasts. She is due for her mammogram in December. She continues to be followed by medical oncology. Next visit in medical oncology is September 6. Her husband recently had a stroke and is having some memory loss from that. This is difficult for her. She is not taking anti-estrogen therapy.                    ALLERGIES:  is allergic to codeine; morphine and related; penicillins; percocet; and sulfa antibiotics.  Meds: Current Outpatient Prescriptions  Medication Sig Dispense Refill  . aspirin 81 MG tablet Take 81 mg by mouth daily.        . Calcium Carb-Cholecalciferol (CALCIUM 1000 + D PO) Take 1,200 tablets by mouth daily.        Marland Kitchen co-enzyme Q-10 30 MG capsule Take 30 mg by mouth daily.       . cyclobenzaprine (FLEXERIL) 10 MG tablet TAKE 1 TABLET BY MOUTH THREE TIMES DAILY AS NEEDED  30 tablet  5  . fish oil-omega-3 fatty acids 1000 MG capsule Take by mouth 2 (two) times daily.       Marland Kitchen lisinopril (PRINIVIL,ZESTRIL) 20 MG tablet       . metoprolol (LOPRESSOR) 50 MG tablet TAKE 1 TABLET BY MOUTH TWICE DAILY  60 tablet  5  . nitrofurantoin (MACRODANTIN) 50 MG capsule Take 50 mg by mouth as needed.      . valACYclovir (VALTREX) 500 MG tablet Take 500 mg by mouth as needed. Dr. Solon Augusta      . ZETIA 10 MG tablet TAKE ONE TABLET BY MOUTH DAILY  30 tablet  8  . ciprofloxacin (CIPRO) 500 MG tablet Take 500 mg by mouth as needed.        Physical Findings: The patient is in no acute distress.  Patient is alert and oriented.  weight is 159 lb 8 oz (72.349 kg). Her oral temperature is 96.8 F (36 C). Her blood pressure is 130/78 and her pulse is 61. Her respiration is 20. .  well-appearing. Breasts demonstrate no palpable lesions of concern bilaterally, nor any palpable axillary adenopathy. There is a little bit of palpable fibrosis underneath the left lumpectomy scar which is stable. No palpable cervical or supraclavicular lymphadenopathy.   Lab Findings: Lab Results  Component Value Date   WBC 7.0 09/01/2011   HGB 13.3 09/01/2011   HCT 39.1 09/01/2011   MCV 95.1 09/01/2011   PLT 228 09/01/2011      Radiographic Findings: No results found.  Impression:  doing well with no evidence of disease  Plan:  will order a mammogram for this coming December with a followup shortly thereafter. Patient encouraged to call me if she has any issues before then.  _____________________________________   Lonie Peak, MD

## 2012-01-08 NOTE — Progress Notes (Signed)
Pt denies pain, fatigue, loss of appetite. She has been seeing eye dr, had "infection in her cornea, was on antibiotic eye drops". She states she has not had any other medical problems.

## 2012-03-04 ENCOUNTER — Ambulatory Visit: Payer: Federal, State, Local not specified - PPO | Admitting: Oncology

## 2012-04-16 ENCOUNTER — Other Ambulatory Visit: Payer: Self-pay | Admitting: Cardiology

## 2012-05-18 ENCOUNTER — Other Ambulatory Visit: Payer: Self-pay | Admitting: Obstetrics and Gynecology

## 2012-05-18 DIAGNOSIS — Z853 Personal history of malignant neoplasm of breast: Secondary | ICD-10-CM

## 2012-06-15 ENCOUNTER — Encounter: Payer: Self-pay | Admitting: Radiation Oncology

## 2012-06-17 ENCOUNTER — Ambulatory Visit
Admission: RE | Admit: 2012-06-17 | Discharge: 2012-06-17 | Disposition: A | Discharge status: Home / Self Care | Payer: Federal, State, Local not specified - PPO | Source: Ambulatory Visit | Attending: Radiation Oncology | Admitting: Radiation Oncology

## 2012-06-17 ENCOUNTER — Encounter: Payer: Self-pay | Admitting: Radiation Oncology

## 2012-06-17 VITALS — BP 135/73 | HR 64 | Temp 97.5°F | Wt 156.2 lb

## 2012-06-17 DIAGNOSIS — Z880 Allergy status to penicillin: Secondary | ICD-10-CM | POA: Insufficient documentation

## 2012-06-17 DIAGNOSIS — D0510 Intraductal carcinoma in situ of unspecified breast: Secondary | ICD-10-CM

## 2012-06-17 DIAGNOSIS — Z882 Allergy status to sulfonamides status: Secondary | ICD-10-CM | POA: Insufficient documentation

## 2012-06-17 DIAGNOSIS — C50919 Malignant neoplasm of unspecified site of unspecified female breast: Secondary | ICD-10-CM | POA: Insufficient documentation

## 2012-06-17 DIAGNOSIS — Z886 Allergy status to analgesic agent status: Secondary | ICD-10-CM | POA: Insufficient documentation

## 2012-06-17 MED ORDER — BIAFINE EX EMUL
CUTANEOUS | Status: DC | PRN
  Administered 2012-06-17: 1 via TOPICAL

## 2012-06-17 NOTE — Progress Notes (Signed)
Radiation Oncology         [phone] ________________________________  Name: Shannon Serrano MRN: [id number]  Date: 06/17/2012  DOB: [birth year]  Follow-Up Visit Note  Outpatient  CC: Cassell Clement, MD   Diagnosis:   Hi-Grade DCIS - Left Breast; ER+  Interval Since Last Radiation:  She completed 42.56 Gy/16 fractions on 08-11-10  Narrative:  The patient returns today for routine follow-up.  She is actually having her mammogram on 12-24- which was supposed to be scheduled before this visit.  She is doing well, though.  No new complaints.  She is not on anti-estrogen therapy.  No palpated bumps in her breasts.                              ALLERGIES:  is allergic to codeine; morphine and related; penicillins; percocet; and sulfa antibiotics.  Meds: Current Outpatient Prescriptions  Medication Sig Dispense Refill  . aspirin 81 MG tablet Take 81 mg by mouth daily.        . Calcium Carb-Cholecalciferol (CALCIUM 1000 + D PO) Take 1,200 tablets by mouth daily.        Marland Kitchen co-enzyme Q-10 30 MG capsule Take 30 mg by mouth daily.       . cyclobenzaprine (FLEXERIL) 10 MG tablet TAKE 1 TABLET BY MOUTH THREE TIMES DAILY AS NEEDED  30 tablet  5  . fish oil-omega-3 fatty acids 1000 MG capsule Take by mouth 2 (two) times daily.       Marland Kitchen lisinopril (PRINIVIL,ZESTRIL) 20 MG tablet       . metoprolol (LOPRESSOR) 50 MG tablet TAKE 1 TABLET BY MOUTH TWICE DAILY  60 tablet  6  . nitrofurantoin (MACRODANTIN) 50 MG capsule Take 50 mg by mouth as needed.      Marland Kitchen ZETIA 10 MG tablet TAKE ONE TABLET BY MOUTH DAILY  30 tablet  8  . ciprofloxacin (CIPRO) 500 MG tablet Take 500 mg by mouth as needed.      . valACYclovir (VALTREX) 500 MG tablet Take 500 mg by mouth as needed. Dr. Solon Augusta        Physical Findings: The patient is in no acute distress. Patient is alert and oriented.  weight is 156 lb 3.2 oz (70.852 kg). Her temperature is 97.5 F (36.4 C). Her blood pressure is 135/73 and her pulse is 64. .  No  significant changes. Neck- No adenopathy.  Breasts - no palpable lesions or axillary adenopathy bilaterally.  Lab Findings: Lab Results  Component Value Date   WBC 7.0 09/01/2011   HGB 13.3 09/01/2011   HCT 39.1 09/01/2011   MCV 95.1 09/01/2011   PLT 228 09/01/2011    CMP     Component Value Date/Time   NA 136 09/17/2011 0848   K 4.2 09/17/2011 0848   CL 102 09/17/2011 0848   CO2 26 09/17/2011 0848   GLUCOSE 108* 09/17/2011 0848   BUN 17 09/17/2011 0848   CREATININE 1.1 09/17/2011 0848   CALCIUM 9.0 09/17/2011 0848   PROT 6.6 09/17/2011 0848   ALBUMIN 3.8 09/17/2011 0848   AST 30 09/17/2011 0848   ALT 34 09/17/2011 0848   ALKPHOS 49 09/17/2011 0848   BILITOT 0.6 09/17/2011 0848   GFRNONAA 49* 06/18/2010 0924   GFRAA  Value: 59        The eGFR has been calculated using the MDRD equation. This calculation has not been validated in all clinical situations. eGFR's  persistently <60 mL/min signify possible Chronic Kidney Disease.* 06/18/2010 4098      Radiographic Findings: No results found.  Impression/Plan:  Doing well. Mammogram next week.  I will see her back in Jan 2015 so that it follows her subsequent annual mammogram. She knows to call if she has any questions or issues before then. _____________________________________   Lonie Peak, MD

## 2012-06-17 NOTE — Progress Notes (Signed)
Dr. Basilio Cairo approved use of Biafine for itchy area on inner upper portion of previously treated breast.

## 2012-06-17 NOTE — Progress Notes (Signed)
FU today.  C/o itching in one area of treatment field as was present on her last visit.  Given Biafine

## 2012-06-21 ENCOUNTER — Ambulatory Visit
Admission: RE | Admit: 2012-06-21 | Discharge: 2012-06-21 | Disposition: A | Discharge status: Home / Self Care | Payer: Federal, State, Local not specified - PPO | Source: Ambulatory Visit | Attending: Obstetrics and Gynecology | Admitting: Obstetrics and Gynecology

## 2012-06-21 ENCOUNTER — Other Ambulatory Visit: Payer: Self-pay | Admitting: Obstetrics and Gynecology

## 2012-06-21 DIAGNOSIS — Z853 Personal history of malignant neoplasm of breast: Secondary | ICD-10-CM

## 2012-06-30 ENCOUNTER — Other Ambulatory Visit: Payer: Self-pay

## 2012-06-30 MED ORDER — CYCLOBENZAPRINE HCL 10 MG PO TABS: 10.0000 mg | ORAL_TABLET | Freq: Three times a day (TID) | ORAL | Status: DC | PRN

## 2012-08-04 ENCOUNTER — Other Ambulatory Visit: Payer: Self-pay | Admitting: *Deleted

## 2012-08-04 MED ORDER — LISINOPRIL 20 MG PO TABS: 20.0000 mg | ORAL_TABLET | Freq: Two times a day (BID) | ORAL | Status: DC

## 2012-09-05 ENCOUNTER — Other Ambulatory Visit: Payer: Self-pay | Admitting: *Deleted

## 2012-09-05 MED ORDER — EZETIMIBE 10 MG PO TABS: 10.0000 mg | ORAL_TABLET | Freq: Every day | ORAL | Status: DC

## 2012-11-01 ENCOUNTER — Other Ambulatory Visit: Payer: Self-pay

## 2012-11-01 ENCOUNTER — Other Ambulatory Visit: Payer: Self-pay | Admitting: *Deleted

## 2012-11-01 MED ORDER — EZETIMIBE 10 MG PO TABS: 10.0000 mg | ORAL_TABLET | Freq: Every day | ORAL | Status: DC

## 2012-11-01 MED ORDER — LISINOPRIL 20 MG PO TABS: 20.0000 mg | ORAL_TABLET | Freq: Two times a day (BID) | ORAL | Status: DC

## 2012-11-29 ENCOUNTER — Other Ambulatory Visit: Payer: Self-pay | Admitting: *Deleted

## 2012-11-29 MED ORDER — METOPROLOL TARTRATE 50 MG PO TABS: ORAL_TABLET | ORAL | Status: DC

## 2012-12-23 ENCOUNTER — Encounter: Payer: Self-pay | Admitting: *Deleted

## 2012-12-28 ENCOUNTER — Encounter: Payer: Self-pay | Admitting: Obstetrics and Gynecology

## 2012-12-28 ENCOUNTER — Ambulatory Visit (INDEPENDENT_AMBULATORY_CARE_PROVIDER_SITE_OTHER): Payer: Federal, State, Local not specified - PPO | Admitting: Obstetrics and Gynecology

## 2012-12-28 VITALS — BP 116/66 | HR 72 | Resp 18 | Ht 60.5 in | Wt 151.0 lb

## 2012-12-28 DIAGNOSIS — Z01419 Encounter for gynecological examination (general) (routine) without abnormal findings: Secondary | ICD-10-CM

## 2012-12-28 MED ORDER — VALACYCLOVIR HCL 500 MG PO TABS: 500.0000 mg | ORAL_TABLET | Freq: Two times a day (BID) | ORAL | Status: DC

## 2012-12-28 NOTE — Progress Notes (Signed)
79 y.o.   Married    Caucasian   female   [id number]   here for annual exam.  No HSV outbreaks this year.    No LMP recorded. Patient has had a hysterectomy.          Sexually active: yes  The current method of family planning is status post hysterectomy.    Exercising: walking qd Last mammogram:  06/21/12 Last pap smear: not sure, none filed in chart History of abnormal pap: no Smoking: no Alcohol: no Last colonoscopy: 06/2012 Last Bone Density:  05/26/10 Last tetanus shot: up to date 12/2006 Last cholesterol check:  4 months- Normal  Hgb: PCP              Urine: PCP   Family History  Problem Relation Age of Onset  . Heart disease Mother   . Hypertension Mother   . Diabetes Father   . Heart disease Father   . Colon cancer Sister   . Pancreatic cancer Brother   . Pancreatic cancer Sister   . Brain cancer Brother   . Heart attack Sister   . Uterine cancer Sister     Patient Active Problem List   Diagnosis Date Noted  . DCIS of the breast 06/17/2012  . DCIS (ductal carcinoma in situ)   . Fasting hyperglycemia 05/18/2011  . Benign hypertensive heart disease without heart failure 12/05/2010  . Hypercholesterolemia 12/05/2010  . Ductal carcinoma in situ of breast 06/03/2010    Past Medical History  Diagnosis Date  . Hypertension   . High cholesterol   . Difficulty urinating   . Skin cancer, basal cell     leg  . Hearing loss   . Sinus congestion   . Syncope   . Hyperlipidemia     History of hyperlipidemia  . History of fibromyalgia   . History of hysterectomy     History of hysterectomy for fibroids She still has her ovaries  . Fibromyalgia   . Stroke 12/31/2007    mini stroke / has had several   . Single kidney     RIGHT SIDE WITH NONFUNCTIONING LEFT KIDNEY  . S/P radiation therapy 07/22/10 - 08/11/10    Whole Left Breast/42.56 Gy/16 Fradctions  . DCIS (ductal carcinoma in situ) 2011    left breast  . Global amnesia 11/2006  . DVT (deep venous thrombosis) 11/2007     Left leg- on lovenox x 1 injection only   . Skin cancer L Lower Leg  . Diverticulosis   . Clavicle fracture 04/2004    Past Surgical History  Procedure Laterality Date  . Appendectomy  1948  . Breast lumpectomy  06/18/10     LEFT BREAST -HIGHG RADE DUCTAL CARCINOMA INSITU, ER+, PR+,  . Doppler echocardiography  05/20/2007    EF 55-60%  . Other surgical history      She also had cataract surgery on the right eye  . Abdominal hysterectomy  1978  . Laparoscopic cholecystectomy  2001; 2004  . Eye surgery  08;09    excessive oil film removed from eyes    Allergies: Codeine; Morphine and related; Penicillins; Percocet; and Sulfa antibiotics  Current Outpatient Prescriptions  Medication Sig Dispense Refill  . aspirin 81 MG tablet Take 81 mg by mouth daily.        . B Complex Vitamins (B COMPLEX PO) Take by mouth.      . Calcium Carb-Cholecalciferol (CALCIUM 1000 + D PO) Take 1,200 tablets by mouth daily.        Marland Kitchen  Cholecalciferol (VITAMIN D PO) Take by mouth.      . ciprofloxacin (CIPRO) 500 MG tablet Take 500 mg by mouth as needed.      Marland Kitchen co-enzyme Q-10 30 MG capsule Take 30 mg by mouth daily.       . cyclobenzaprine (FLEXERIL) 10 MG tablet Take 1 tablet (10 mg total) by mouth 3 (three) times daily as needed for muscle spasms.  30 tablet  5  . ezetimibe (ZETIA) 10 MG tablet Take 1 tablet (10 mg total) by mouth daily.  30 tablet  1  . fish oil-omega-3 fatty acids 1000 MG capsule Take by mouth 2 (two) times daily.       Marland Kitchen lisinopril (PRINIVIL,ZESTRIL) 20 MG tablet Take 1 tablet (20 mg total) by mouth 2 (two) times daily.  60 tablet  1  . metoprolol (LOPRESSOR) 50 MG tablet TAKE 1 TABLET BY MOUTH TWICE DAILY  60 tablet  0  . Multiple Vitamins-Minerals (MULTIVITAMIN PO) Take by mouth.      . nitrofurantoin (MACRODANTIN) 50 MG capsule Take 50 mg by mouth as needed.      . valACYclovir (VALTREX) 500 MG tablet Take 500 mg by mouth as needed. Dr. Solon Augusta       No current  facility-administered medications for this visit.    ROS: Pertinent items are noted in HPI.  Social Hx:  Married, works for the [employer] at [employer] center, husband having memory problems and had a stroke   Exam:    BP 116/66  Pulse 72  Resp 18  Ht 5' 0.5" (1.537 m)  Wt 151 lb (68.493 kg)  BMI 28.99 kg/m2   Wt Readings from Last 3 Encounters:  12/28/12 151 lb (68.493 kg)  06/17/12 156 lb 3.2 oz (70.852 kg)  01/08/12 159 lb 8 oz (72.349 kg)     Ht Readings from Last 3 Encounters:  12/28/12 5' 0.5" (1.537 m)  09/17/11 5\' 1"  (1.549 m)  09/01/11 5\' 2"  (1.575 m)    General appearance: alert, cooperative and appears stated age Head: Normocephalic, without obvious abnormality, atraumatic Neck: no adenopathy, supple, symmetrical, trachea midline and thyroid not enlarged, symmetric, no tenderness/mass/nodules Lungs: clear to auscultation bilaterally Breasts: Inspection negative, No nipple retraction or dimpling, No nipple discharge or bleeding, No axillary or supraclavicular adenopathy, Normal to palpation without dominant masses Heart: regular rate and rhythm Abdomen: soft, non-tender; bowel sounds normal; no masses,  no organomegaly Extremities: extremities normal, atraumatic, no cyanosis or edema Skin: Skin color, texture, turgor normal. No rashes or lesions Lymph nodes: Cervical, supraclavicular, and axillary nodes normal. No abnormal inguinal nodes palpated Neurologic: Grossly normal   Pelvic: External genitalia:  no lesions              Urethra:  normal appearing urethra with no masses, tenderness or lesions              Bartholins and Skenes: normal                 Vagina: normal appearing vagina with normal color and discharge, no lesions              Cervix:absent              Pap taken: no        Bimanual Exam:  Uterus:  absent  Adnexa: normal adnexa in size, nontender and no masses                                       Rectovaginal: Confirms                                      Anus:  normal sphincter tone, no lesions  A: normal menopausal exam, no Hrt     Multiple medical problems- fibromyalgia, s/p CVA, s/p DVT among others     H/o DCIS left breast 2011     S/p TVH, ovaries retained     P: mammogram counseled on breast self exam, mammography screening, adequate intake of calcium and vitamin D, diet and exercise return annually or prn     An After Visit Summary was printed and given to the patient.

## 2012-12-28 NOTE — Patient Instructions (Signed)

## 2013-01-01 ENCOUNTER — Other Ambulatory Visit: Payer: Self-pay | Admitting: Cardiology

## 2013-01-01 DIAGNOSIS — M791 Myalgia, unspecified site: Secondary | ICD-10-CM

## 2013-01-19 ENCOUNTER — Other Ambulatory Visit: Payer: Self-pay | Admitting: Cardiology

## 2013-02-02 ENCOUNTER — Other Ambulatory Visit: Payer: Self-pay | Admitting: Cardiology

## 2013-02-16 ENCOUNTER — Other Ambulatory Visit: Payer: Self-pay | Admitting: Cardiology

## 2013-03-07 ENCOUNTER — Other Ambulatory Visit: Payer: Self-pay | Admitting: Cardiology

## 2013-03-07 DIAGNOSIS — M545 Low back pain, unspecified: Secondary | ICD-10-CM

## 2013-03-16 ENCOUNTER — Other Ambulatory Visit: Payer: Self-pay | Admitting: Cardiology

## 2013-03-16 NOTE — Telephone Encounter (Signed)
Please call patient and get her to make appt so we can fill medication 

## 2013-04-05 ENCOUNTER — Other Ambulatory Visit: Payer: Self-pay | Admitting: Cardiology

## 2013-04-05 NOTE — Telephone Encounter (Signed)
Are these ok to refill again? She is overdue for an ov and has been warned numerous times on her refills.

## 2013-04-14 NOTE — Telephone Encounter (Signed)
Patient is seeing Dr Pete Glatter now

## 2013-04-30 ENCOUNTER — Other Ambulatory Visit: Payer: Self-pay | Admitting: Cardiology

## 2013-05-14 ENCOUNTER — Other Ambulatory Visit: Payer: Self-pay | Admitting: Cardiology

## 2013-06-02 ENCOUNTER — Other Ambulatory Visit: Payer: Self-pay | Admitting: Geriatric Medicine

## 2013-06-02 ENCOUNTER — Other Ambulatory Visit: Payer: Self-pay | Admitting: Radiation Oncology

## 2013-06-02 DIAGNOSIS — Z853 Personal history of malignant neoplasm of breast: Secondary | ICD-10-CM

## 2013-06-26 ENCOUNTER — Ambulatory Visit
Admission: RE | Admit: 2013-06-26 | Discharge: 2013-06-26 | Disposition: A | Discharge status: Home / Self Care | Payer: Federal, State, Local not specified - PPO | Source: Ambulatory Visit | Attending: Radiation Oncology | Admitting: Radiation Oncology

## 2013-06-26 DIAGNOSIS — Z853 Personal history of malignant neoplasm of breast: Secondary | ICD-10-CM

## 2013-07-04 ENCOUNTER — Ambulatory Visit: Payer: Federal, State, Local not specified - PPO | Admitting: Radiation Oncology

## 2013-07-07 DIAGNOSIS — M25559 Pain in unspecified hip: Secondary | ICD-10-CM | POA: Insufficient documentation

## 2013-07-12 ENCOUNTER — Encounter: Payer: Self-pay | Admitting: Cardiology

## 2013-07-12 ENCOUNTER — Ambulatory Visit (INDEPENDENT_AMBULATORY_CARE_PROVIDER_SITE_OTHER): Payer: Federal, State, Local not specified - PPO | Admitting: Cardiology

## 2013-07-12 VITALS — BP 148/64 | HR 67 | Ht 63.0 in | Wt 153.0 lb

## 2013-07-12 DIAGNOSIS — R7301 Impaired fasting glucose: Secondary | ICD-10-CM

## 2013-07-12 DIAGNOSIS — R6884 Jaw pain: Secondary | ICD-10-CM | POA: Insufficient documentation

## 2013-07-12 DIAGNOSIS — I119 Hypertensive heart disease without heart failure: Secondary | ICD-10-CM

## 2013-07-12 DIAGNOSIS — E7800 Pure hypercholesterolemia, unspecified: Secondary | ICD-10-CM

## 2013-07-12 NOTE — Patient Instructions (Signed)
Your physician has requested that you have a lexiscan myoview. For further information please visit [email or web address]. Please follow instruction sheet, as given.  Your physician recommends that you continue on your current medications as directed. Please refer to the Current Medication list given to you today.  Follow up as needed  

## 2013-07-12 NOTE — Progress Notes (Signed)
Shannon Serrano Date of Birth:  Oct 04, 1928 [address] [place], [place]  [address] [phone]         Fax   [phone]  History of Present Illness: This pleasant 80 year old woman is seen after a long absence.  She is seen at the request of Dr. Felipa Eth.  She was seen in reference to her recent symptoms of intermittent bilateral jaw discomfort.  She has had several episodes of this over the past several months.  The episodes occur while she is busy at work.  She has not had any episodes at home.  She does not have any known history of ischemic heart disease.  She does have a past history of mini strokes diagnosed by Dr. Jannifer Franklin in 2008.  On 05/20/07 she had a echocardiogram with a negative bubble study.  Her ejection fraction was 55-60% and she was found to have grade 1 diastolic dysfunction and mild aortic valve sclerosis without stenosis.  She is a nonsmoker.  She does have a history of hypercholesterolemia with elevated LDL levels.  She also has a history of essential hypertension.  She is not able to get much exercise because of bilateral hip discomfort worse on the right for which she has seen her orthopedist.  She does have a past history of fibromyalgia.  She states that her bilateral jaw discomfort is different than her previous fibromyalgia or neuralgia.  The patient denies any substernal chest pain.  She has not had any significant dyspnea with ordinary exertion.  She has not been aware of any racing of her heart or palpitations.  She had an EKG dated 02/11/12 which showed no ischemic changes.  Current Outpatient Prescriptions  Medication Sig Dispense Refill  . aspirin 81 MG tablet Take 81 mg by mouth daily.        . B Complex Vitamins (B COMPLEX PO) Take by mouth.      . Calcium Carb-Cholecalciferol (CALCIUM 1000 + D PO) Take 1,200 tablets by mouth daily.        . Cholecalciferol (VITAMIN D PO) Take by mouth.      . ciprofloxacin (CIPRO) 500 MG tablet Take 500  mg by mouth as needed.      Marland Kitchen co-enzyme Q-10 30 MG capsule Take 30 mg by mouth daily.       . cyclobenzaprine (FLEXERIL) 10 MG tablet TAKE 1 TABLET BY MOUTH THREE TIMES DAILY AS NEEDED FOR MUSCLE SPASMS  30 tablet  0  . lisinopril (PRINIVIL,ZESTRIL) 20 MG tablet Take 20 mg by mouth 2 (two) times daily. 1 tablet daily      . metoprolol (LOPRESSOR) 50 MG tablet 50 mg. TAKE 1 TABLET BY MOUTH TWICE DAILY      . Multiple Vitamins-Minerals (MULTIVITAMIN PO) Take by mouth.      . nitrofurantoin (MACRODANTIN) 50 MG capsule Take 50 mg by mouth as needed.      . valACYclovir (VALTREX) 500 MG tablet Take 1 tablet (500 mg total) by mouth 2 (two) times daily. Dr. Delfino Lovett  6 tablet  3  . ZETIA 10 MG tablet TAKE 1 TABLET BY MOUTH DAILY  30 tablet  1   No current facility-administered medications for this visit.    Allergies  Allergen Reactions  . Codeine   . Morphine And Related   . Penicillins   . Percocet [Oxycodone-Acetaminophen]   . Sulfa Antibiotics     Patient Active Problem List   Diagnosis Date Noted  . Benign  hypertensive heart disease without heart failure 12/05/2010    Priority: High  . Hypercholesterolemia 12/05/2010    Priority: High  . Jaw pain 07/12/2013  . DCIS of the breast 06/17/2012  . DCIS (ductal carcinoma in situ)   . Fasting hyperglycemia 05/18/2011  . Ductal carcinoma in situ of breast 06/03/2010    History  Smoking status  . Never Smoker   Smokeless tobacco  . Not on file    History  Alcohol Use No    Family History  Problem Relation Age of Onset  . Heart disease Mother   . Hypertension Mother   . Diabetes Father   . Heart disease Father   . Colon cancer Sister   . Pancreatic cancer Brother   . Pancreatic cancer Sister   . Brain cancer Brother   . Heart attack Sister   . Uterine cancer Sister     Review of Systems: Constitutional: no fever chills diaphoresis or fatigue or change in weight.  Head and neck: no hearing loss, no epistaxis, no  photophobia or visual disturbance. Respiratory: No cough, shortness of breath or wheezing. Cardiovascular: No chest pain peripheral edema, palpitations.  Positive for intermittent bilateral jaw discomfort Gastrointestinal: No abdominal distention, no abdominal pain, no change in bowel habits hematochezia or melena. Genitourinary: No dysuria, no frequency, no urgency, no nocturia. Musculoskeletal:No arthralgias, no back pain, no gait disturbance or myalgias.  Positive for bilateral hip pain and fibromyalgia Neurological: No dizziness, no headaches, no numbness, no seizures, no syncope, no weakness, no tremors. Hematologic: No lymphadenopathy, no easy bruising. Psychiatric: No confusion, no hallucinations, no sleep disturbance.    Physical Exam: Filed Vitals:   07/12/13 1130  BP: 148/64  Pulse: 67   the general appearance reveals a well-developed well-nourished elderly woman who appears younger than her stated age.The head and neck exam reveals pupils equal and reactive.  Extraocular movements are full.  There is no scleral icterus.  The mouth and pharynx are normal.  The neck is supple.  The carotids reveal no bruits.  The jugular venous pressure is normal.  The  thyroid is not enlarged.  There is no lymphadenopathy.  The chest is clear to percussion and auscultation.  There are no rales or rhonchi.  Expansion of the chest is symmetrical.  The precordium is quiet.  The first heart sound is normal.  The second heart sound is physiologically split.  There is no murmur gallop rub or click.  There is no abnormal lift or heave.  The abdomen is soft and nontender.  The bowel sounds are normal.  The liver and spleen are not enlarged.  There are no abdominal masses.  There are no abdominal bruits.  Extremities reveal good pedal pulses.  There is no phlebitis or edema.  There is no cyanosis or clubbing.  Strength is normal and symmetrical in all extremities.  There is no lateralizing weakness.  There are no  sensory deficits.  The skin is warm and dry.  There is no rash.     Assessment / Plan: The patient has multiple risk factors for ischemic heart disease including hypertension and elevated LDL levels.  She has a past history of mini strokes.  She is now experiencing bilateral jaw discomfort which may be an anginal equivalent.  I have recommended that she return for a Lexi scan Myoview stress.  She will continue her current medication including daily baby aspirin.  Many thanks for the opportunity to see this pleasant woman with you.  I will be in touch with you regarding the results of the stress test.

## 2013-07-25 ENCOUNTER — Ambulatory Visit (HOSPITAL_COMMUNITY): Payer: Federal, State, Local not specified - PPO | Attending: Cardiology | Admitting: Radiology

## 2013-07-25 ENCOUNTER — Encounter: Payer: Self-pay | Admitting: Cardiology

## 2013-07-25 VITALS — BP 112/82 | Ht 63.0 in | Wt 148.0 lb

## 2013-07-25 DIAGNOSIS — I119 Hypertensive heart disease without heart failure: Secondary | ICD-10-CM

## 2013-07-25 DIAGNOSIS — Z8673 Personal history of transient ischemic attack (TIA), and cerebral infarction without residual deficits: Secondary | ICD-10-CM | POA: Insufficient documentation

## 2013-07-25 DIAGNOSIS — R0602 Shortness of breath: Secondary | ICD-10-CM

## 2013-07-25 DIAGNOSIS — E7800 Pure hypercholesterolemia, unspecified: Secondary | ICD-10-CM

## 2013-07-25 DIAGNOSIS — R5381 Other malaise: Secondary | ICD-10-CM | POA: Insufficient documentation

## 2013-07-25 DIAGNOSIS — R079 Chest pain, unspecified: Secondary | ICD-10-CM

## 2013-07-25 DIAGNOSIS — R6884 Jaw pain: Secondary | ICD-10-CM

## 2013-07-25 DIAGNOSIS — R0609 Other forms of dyspnea: Secondary | ICD-10-CM | POA: Insufficient documentation

## 2013-07-25 DIAGNOSIS — R5383 Other fatigue: Secondary | ICD-10-CM

## 2013-07-25 DIAGNOSIS — R0989 Other specified symptoms and signs involving the circulatory and respiratory systems: Secondary | ICD-10-CM | POA: Insufficient documentation

## 2013-07-25 DIAGNOSIS — Z8249 Family history of ischemic heart disease and other diseases of the circulatory system: Secondary | ICD-10-CM | POA: Insufficient documentation

## 2013-07-25 DIAGNOSIS — I10 Essential (primary) hypertension: Secondary | ICD-10-CM | POA: Insufficient documentation

## 2013-07-25 DIAGNOSIS — R002 Palpitations: Secondary | ICD-10-CM | POA: Insufficient documentation

## 2013-07-25 MED ORDER — TECHNETIUM TC 99M SESTAMIBI GENERIC - CARDIOLITE
10.8000 | Freq: Once | INTRAVENOUS | Status: AC | PRN
  Administered 2013-07-25: 11 via INTRAVENOUS

## 2013-07-25 MED ORDER — REGADENOSON 0.4 MG/5ML IV SOLN
0.4000 mg | Freq: Once | INTRAVENOUS | Status: AC
  Administered 2013-07-25: 0.4 mg via INTRAVENOUS

## 2013-07-25 MED ORDER — TECHNETIUM TC 99M SESTAMIBI GENERIC - CARDIOLITE
33.0000 | Freq: Once | INTRAVENOUS | Status: AC | PRN
  Administered 2013-07-25: 33 via INTRAVENOUS

## 2013-07-25 NOTE — Progress Notes (Signed)
[facility] [address] [place], [place] [address] [phone]    Cardiology Nuclear Med Study  Shannon Serrano is a 80 y.o. female     MRN : [id number]     DOB: 06/13/1929  Procedure Date: 07/25/2013  Nuclear Med Background Indication for Stress Test:  Evaluation for Ischemia History:  08/27/05 MPI: NL EF: 88% '08 ECHO: EF: 55-60% (L) Breast CA  Lumpectomy 3 yrs ago Cardiac Risk Factors: CVA, Family History - CAD, Hypertension and Lipids  Symptoms:  DOE, Fatigue, Palpitations, SOB and jaw discomfort   Nuclear Pre-Procedure Caffeine/Decaff Intake:  None > 12 hrs NPO After: 7:00pm   Lungs:  clear O2 Sat: 95% on room air. IV 0.9% NS with Angio Cath:  22g  IV Site: R Antecubital x 1, tolerated well IV Started by:  Irven Baltimore, RN  Chest Size (in):  38 Cup Size: C  Height: 5\' 3"  (1.6 m)  Weight:  148 lb (67.132 kg)  BMI:  Body mass index is 26.22 kg/(m^2). Tech Comments:  Held Lopressor today, and took Lisinopril on arrival.    Nuclear Med Study 1 or 2 day study: 1 day  Stress Test Type:  Treadmill/Lexiscan  Reading MD: N/A  Order Authorizing Provider:  Darlin Coco, MD  Resting Radionuclide: Technetium 37m Sestamibi  Resting Radionuclide Dose: 11.0 mCi   Stress Radionuclide:  Technetium 45m Sestamibi  Stress Radionuclide Dose: 33.0 mCi           Stress Protocol Rest HR: 72 Stress HR: 107  Rest BP: 112/82 Stress BP: 129/88  Exercise Time (min): n/a METS: n/a   Predicted Max HR: 138 bpm % Max HR: 77.54 bpm Rate Pressure Product: 13803   Dose of Adenosine (mg):  n/a Dose of Lexiscan: 0.4 mg  Dose of Atropine (mg): n/a Dose of Dobutamine: n/a mcg/kg/min (at max HR)  Stress Test Technologist: Perrin Maltese, EMT-P  Nuclear Technologist:  Charlton Amor, CNMT     Rest Procedure:  Myocardial perfusion imaging was performed at rest 45 minutes following the intravenous administration of Technetium 52m Sestamibi. Rest ECG: NSR -  Normal EKG  Stress Procedure:  The patient received IV Lexiscan 0.4 mg over 15-seconds with concurrent low level exercise and then Technetium 72m Sestamibi was injected at 30-seconds while the patient continued walking one more minute. This patient had sob, head tightness, and a headache with the Lexiscan injection. Quantitative spect images were obtained after a 45-minute delay. Stress ECG: No significant change from baseline ECG  QPS Raw Data Images:  There is interference from nuclear activity from structures below the diaphragm affecting the ability to  Read this study. Stress Images:  There are severel perfusion defects in the inferior septal and lateral walls.  Rest Images:  Several perfusion defects in the except anterior wall.  Subtraction (SDS):  No evidence of ischemia. Transient Ischemic Dilatation (Normal <1.22):  0.78 Lung/Heart Ratio (Normal <0.45):  0.32  Quantitative Gated Spect Images QGS EDV:  31 ml QGS ESV:  5 ml  Impression Exercise Capacity:  Lexiscan with low level exercise. BP Response:  Normal blood pressure response. Clinical Symptoms:  There is dyspnea. ECG Impression:  No significant ST segment change suggestive of ischemia. Comparison with Prior Nuclear Study: No images to compare  Overall Impression:  This study was very challenging secondary to significant extracardiac activity that impaired ability to read. Considering that all the defects improve at stress and that LV function is vigorous with LVEF >  75% and no regional  wall motion abnormalities, this is most probably a normal study.    LV Ejection Fraction: 85%.  LV Wall Motion:  NL LV Function; NL Wall Motion  Ena Dawley, Lemmie Evens 07/25/2013

## 2013-08-01 ENCOUNTER — Other Ambulatory Visit: Payer: Self-pay | Admitting: Cardiology

## 2013-08-01 ENCOUNTER — Telehealth: Payer: Self-pay | Admitting: *Deleted

## 2013-08-01 ENCOUNTER — Encounter: Payer: Self-pay | Admitting: Radiation Oncology

## 2013-08-01 ENCOUNTER — Ambulatory Visit
Admission: RE | Admit: 2013-08-01 | Discharge: 2013-08-01 | Disposition: A | Discharge status: Home / Self Care | Payer: Federal, State, Local not specified - PPO | Source: Ambulatory Visit | Attending: Radiation Oncology | Admitting: Radiation Oncology

## 2013-08-01 VITALS — BP 114/75 | HR 96 | Temp 97.3°F | Ht 63.0 in | Wt 150.4 lb

## 2013-08-01 DIAGNOSIS — C50919 Malignant neoplasm of unspecified site of unspecified female breast: Secondary | ICD-10-CM

## 2013-08-01 NOTE — Progress Notes (Addendum)
Shannon Serrano here for fu s/p radiation therapy to her left breast which completed 08/11/10.  She has denies any pain in her treated breast.  Last mammogram was 05/2013.

## 2013-08-01 NOTE — Telephone Encounter (Signed)
Called patient to inform of test (mammogram) on - 06-27-14- arrival time - 2:45 pm @ [facility], lvm for a return call

## 2013-08-01 NOTE — Progress Notes (Signed)
Radiation Oncology         [phone] ________________________________  Name: Shannon Serrano MRN: [id number]  Date: 08/01/2013  DOB: 1929-05-14  Follow-Up Visit Note  outpatient  CC: Mathews Argyle, MD  Haywood Lasso, MD  Diagnosis and Prior Radiotherapy:   Hi-Grade DCIS - Left Breast; ER+  Interval Since Last Radiation: She completed 42.56 Gy/16 fractions on 08-11-10  Narrative:  The patient returns today for routine follow-up.  Annual mammogram was BI-RADS CATEGORY 1: Negative on 06/26/2013.  She has denies any new lumps or bumps in her breasts.   She continues to cope with fibromyalgia.    She continues to work for the [employer].                      ALLERGIES:  is allergic to codeine; morphine and related; penicillins; percocet; and sulfa antibiotics.  Meds: Current Outpatient Prescriptions  Medication Sig Dispense Refill  . aspirin 81 MG tablet Take 81 mg by mouth daily.        . B Complex Vitamins (B COMPLEX PO) Take by mouth.      . Calcium Carb-Cholecalciferol (CALCIUM 1000 + D PO) Take 1,200 tablets by mouth daily.        . Cholecalciferol (VITAMIN D PO) Take by mouth.      . co-enzyme Q-10 30 MG capsule Take 30 mg by mouth daily.       . cyclobenzaprine (FLEXERIL) 10 MG tablet TAKE 1 TABLET BY MOUTH THREE TIMES DAILY AS NEEDED FOR MUSCLE SPASMS  30 tablet  0  . lisinopril (PRINIVIL,ZESTRIL) 20 MG tablet Take 20 mg by mouth 2 (two) times daily. 1 tablet daily      . metoprolol (LOPRESSOR) 50 MG tablet 50 mg. TAKE 1 TABLET BY MOUTH TWICE DAILY      . Multiple Vitamins-Minerals (MULTIVITAMIN PO) Take by mouth.      . nitrofurantoin (MACRODANTIN) 50 MG capsule Take 50 mg by mouth as needed.      . valACYclovir (VALTREX) 500 MG tablet Take 1 tablet (500 mg total) by mouth 2 (two) times daily. Dr. Delfino Lovett  6 tablet  3  . ZETIA 10 MG tablet TAKE 1 TABLET BY MOUTH DAILY  30 tablet  1  . ciprofloxacin (CIPRO) 500 MG tablet Take 500 mg by mouth as needed.       No  current facility-administered medications for this encounter.    Physical Findings: The patient is in no acute distress. Patient is alert and oriented.  height is 5\' 3"  (1.6 m) and weight is 150 lb 6.4 oz (68.221 kg). Her temperature is 97.3 F (36.3 C). Her blood pressure is 114/75 and her pulse is 96. Marland Kitchen   No palpable axillary lymphadenopathy bilaterally. No palpable lesions of concern in either breast.   Lab Findings: Lab Results  Component Value Date   WBC 7.0 09/01/2011   HGB 13.3 09/01/2011   HCT 39.1 09/01/2011   MCV 95.1 09/01/2011   PLT 228 09/01/2011    Radiographic Findings: As above  Impression/Plan:  She is doing very well with no evidence of disease. I recommended that she continue with annual mammography. I will see her back in approximately one year. I encouraged her to call us with any questions or concerns in the interim.  I spent 20 minutes face to face with the patient and more than 50% of that time was spent in counseling and/or coordination of care. _____________________________________   Judson Roch  Isidore Moos, MD

## 2013-10-20 ENCOUNTER — Other Ambulatory Visit: Payer: Self-pay | Admitting: Dermatology

## 2014-01-01 ENCOUNTER — Ambulatory Visit: Payer: Federal, State, Local not specified - PPO | Admitting: Gynecology

## 2014-01-01 ENCOUNTER — Telehealth: Payer: Self-pay | Admitting: Gynecology

## 2014-01-01 NOTE — Telephone Encounter (Signed)
Pt rescheduled her aex appt from today to 8/3 at 9:00 because she just brought her husband home from the hospital and her brother died yesterday. Will not charge a [facility] fee.

## 2014-01-29 ENCOUNTER — Ambulatory Visit: Payer: Federal, State, Local not specified - PPO | Admitting: Gynecology

## 2014-02-05 ENCOUNTER — Encounter: Payer: Self-pay | Admitting: Gynecology

## 2014-02-05 ENCOUNTER — Ambulatory Visit (INDEPENDENT_AMBULATORY_CARE_PROVIDER_SITE_OTHER): Payer: Federal, State, Local not specified - PPO | Admitting: Gynecology

## 2014-02-05 VITALS — BP 100/56 | HR 60 | Resp 12 | Ht 60.75 in | Wt 142.0 lb

## 2014-02-05 DIAGNOSIS — Z01419 Encounter for gynecological examination (general) (routine) without abnormal findings: Secondary | ICD-10-CM

## 2014-02-05 DIAGNOSIS — A6000 Herpesviral infection of urogenital system, unspecified: Secondary | ICD-10-CM

## 2014-02-05 NOTE — Progress Notes (Signed)
80 y.o. Married Caucasian female   [phone] here for annual exam. Pt reports menses are absent due to Hysterectomy.  pt reports having rare HSV outbreaks and uses valtrex as needed.    Patient's last menstrual period was 06/29/1976.          Sexually active: No.  The current method of family planning is status post hysterectomy.    Exercising: Yes.    walking, bicycle qd Last pap: 05/23/2001  Abnormal PAP: no Mammogram: 06/26/13 Bi-Rads 1 BSE:  Yes  Colonoscopy: 2013 Normal  DEXA: 05/19/10, normal  Alcohol: no Tobacco: no  Labs: Domenic Schwab, MD  Health Maintenance  Topic Date Due  . Zostavax  05/17/1991  . Pneumococcal Polysaccharide Vaccine Age 4 And Over  05/16/1996  . Influenza Vaccine  01/27/2014  . Colonoscopy  06/30/2015  . Tetanus/tdap  12/27/2016    Family History  Problem Relation Age of Onset  . Heart disease Mother   . Hypertension Mother   . Diabetes Father   . Heart disease Father   . Colon cancer Sister   . Pancreatic cancer Brother   . Pancreatic cancer Sister   . Brain cancer Brother   . Heart attack Sister   . Uterine cancer Sister     Patient Active Problem List   Diagnosis Date Noted  . Jaw pain 07/12/2013  . DCIS of the breast 06/17/2012  . DCIS (ductal carcinoma in situ)   . Fasting hyperglycemia 05/18/2011  . Benign hypertensive heart disease without heart failure 12/05/2010  . Hypercholesterolemia 12/05/2010  . Ductal carcinoma in situ of breast 06/03/2010    Past Medical History  Diagnosis Date  . Hypertension   . High cholesterol   . Difficulty urinating   . Skin cancer, basal cell     leg  . Hearing loss   . Sinus congestion   . Syncope   . Hyperlipidemia     History of hyperlipidemia  . History of fibromyalgia   . History of hysterectomy     History of hysterectomy for fibroids She still has her ovaries  . Fibromyalgia   . Stroke 12/31/2007    mini stroke / has had several   . Single kidney     RIGHT SIDE WITH  NONFUNCTIONING LEFT KIDNEY  . S/P radiation therapy 07/22/10 - 08/11/10    Whole Left Breast/42.56 Gy/16 Fradctions  . DCIS (ductal carcinoma in situ) 2011    left breast  . Global amnesia 11/2006  . DVT (deep venous thrombosis) 11/2007    Left leg- on lovenox x 1 injection only   . Skin cancer L Lower Leg  . Diverticulosis   . Clavicle fracture 04/2004    Past Surgical History  Procedure Laterality Date  . Appendectomy  1948  . Doppler echocardiography  05/20/2007    EF 55-60%  . Other surgical history      She also had cataract surgery on the right eye  . Abdominal hysterectomy  1978  . Laparoscopic cholecystectomy  2001; 2004  . Eye surgery  08;09    excessive oil film removed from eyes  . Breast lumpectomy  06/18/10     LEFT BREAST -HIGHG RADE DUCTAL CARCINOMA INSITU, ER+, PR+,    Allergies: Codeine; Morphine and related; Penicillins; Percocet; and Sulfa antibiotics  Current Outpatient Prescriptions  Medication Sig Dispense Refill  . aspirin 81 MG tablet Take 81 mg by mouth daily.        . B Complex Vitamins (B COMPLEX PO)  Take by mouth.      . Calcium Carb-Cholecalciferol (CALCIUM 1000 + D PO) Take 1,200 tablets by mouth daily.        . Cholecalciferol (VITAMIN D PO) Take by mouth.      . ciprofloxacin (CIPRO) 500 MG tablet Take 500 mg by mouth as needed.      Marland Kitchen co-enzyme Q-10 30 MG capsule Take 30 mg by mouth daily.       . cyclobenzaprine (FLEXERIL) 10 MG tablet TAKE 1 TABLET BY MOUTH THREE TIMES DAILY AS NEEDED FOR MUSCLE SPASMS  30 tablet  0  . lisinopril (PRINIVIL,ZESTRIL) 20 MG tablet Take 20 mg by mouth 2 (two) times daily. 1 tablet daily      . metoprolol (LOPRESSOR) 50 MG tablet 50 mg. TAKE 1 TABLET BY MOUTH TWICE DAILY      . metoprolol (LOPRESSOR) 50 MG tablet TAKE 1 TABLET BY MOUTH TWICE DAILY  60 tablet  5  . Multiple Vitamins-Minerals (MULTIVITAMIN PO) Take by mouth.      . nitrofurantoin (MACRODANTIN) 50 MG capsule Take 50 mg by mouth as needed.      .  valACYclovir (VALTREX) 500 MG tablet Take 1 tablet (500 mg total) by mouth 2 (two) times daily. Dr. Delfino Lovett  6 tablet  3  . ZETIA 10 MG tablet TAKE 1 TABLET BY MOUTH DAILY  30 tablet  1   No current facility-administered medications for this visit.    ROS: Pertinent items are noted in HPI.  Exam:    LMP 06/29/1976 Weight change: @WEIGHTCHANGE @ Last 3 height recordings:  Ht Readings from Last 3 Encounters:  08/01/13 5\' 3"  (1.6 m)  07/25/13 5\' 3"  (1.6 m)  07/12/13 5\' 3"  (1.6 m)   General appearance: alert, cooperative and appears stated age Head: Normocephalic, without obvious abnormality, atraumatic Neck: no adenopathy, no carotid bruit, no JVD, supple, symmetrical, trachea midline and thyroid not enlarged, symmetric, no tenderness/mass/nodules Lungs: clear to auscultation bilaterally Breasts: normal appearance, no masses or tenderness Heart: regular rate and rhythm, S1, S2 normal, no murmur, click, rub or gallop Abdomen: soft, non-tender; bowel sounds normal; no masses,  no organomegaly Extremities: extremities normal, atraumatic, no cyanosis or edema Skin: Skin color, texture, turgor normal. No rashes or lesions Lymph nodes: Cervical, supraclavicular, and axillary nodes normal. no inguinal nodes palpated Neurologic: Grossly normal   Pelvic: External genitalia:  atrophic appearance and no lesions              Urethra: stenotic              Bartholins and Skenes: normal                 Vagina: atrophic              Cervix: absent              Pap taken: No.        Bimanual Exam:  Uterus:  absent                                      Adnexa:    no masses                                      Rectovaginal: Confirms  Anus:  normal sphincter tone, no lesions      1. Routine gynecological examination   counseled on breast self exam, mammography screening, adequate intake of calcium and vitamin D, diet and exercise DEXA normal at 81yo, no need  for repeat-pt agrees return annually or prn Discussed PAP guideline changes, importance of weight bearing exercises, calcium, vit D and balanced diet.   2. Herpes genitalis No evidence of lesions Will refill as needed, pt agreeable   An After Visit Summary was printed and given to the patient.

## 2014-03-20 ENCOUNTER — Other Ambulatory Visit: Payer: Self-pay | Admitting: *Deleted

## 2014-03-20 NOTE — Telephone Encounter (Addendum)
Last refilled: 12/28/12 by Dr. Joan Flores Last AEX: 02/05/14 patient was to have this rx filled prn AEX Scheduled: 02/08/15 with Dr. Charlies Constable  (sent to Dr. Sabra Heck given Dr. Charlies Constable is out of office today.)  Please advise.

## 2014-03-21 NOTE — Telephone Encounter (Signed)
Sorry, what rx is needed?

## 2014-03-21 NOTE — Telephone Encounter (Signed)
Valtrex sorry about that.

## 2014-03-23 MED ORDER — VALACYCLOVIR HCL 500 MG PO TABS: 500.0000 mg | ORAL_TABLET | Freq: Two times a day (BID) | ORAL | Status: DC

## 2014-04-30 ENCOUNTER — Encounter: Payer: Self-pay | Admitting: Gynecology

## 2014-05-30 ENCOUNTER — Telehealth: Payer: Self-pay

## 2014-05-30 NOTE — Telephone Encounter (Signed)
lmtcb to reschedule AEX with Dr. Lathrop 

## 2014-06-27 ENCOUNTER — Ambulatory Visit
Admission: RE | Admit: 2014-06-27 | Discharge: 2014-06-27 | Disposition: A | Discharge status: Home / Self Care | Payer: Federal, State, Local not specified - PPO | Source: Ambulatory Visit | Attending: Radiation Oncology | Admitting: Radiation Oncology

## 2014-06-27 DIAGNOSIS — C50919 Malignant neoplasm of unspecified site of unspecified female breast: Secondary | ICD-10-CM

## 2014-07-31 ENCOUNTER — Ambulatory Visit: Payer: Self-pay | Admitting: Radiation Oncology

## 2014-07-31 ENCOUNTER — Other Ambulatory Visit: Payer: Self-pay | Admitting: Cardiology

## 2014-08-01 ENCOUNTER — Other Ambulatory Visit: Payer: Self-pay | Admitting: Cardiology

## 2014-08-01 ENCOUNTER — Ambulatory Visit: Payer: Federal, State, Local not specified - PPO | Admitting: Radiation Oncology

## 2014-08-10 ENCOUNTER — Ambulatory Visit
Admission: RE | Admit: 2014-08-10 | Discharge: 2014-08-10 | Disposition: A | Discharge status: Home / Self Care | Payer: Federal, State, Local not specified - PPO | Source: Ambulatory Visit | Attending: Radiation Oncology | Admitting: Radiation Oncology

## 2014-08-10 ENCOUNTER — Encounter: Payer: Self-pay | Admitting: Radiation Oncology

## 2014-08-10 VITALS — BP 121/61 | HR 78 | Temp 98.1°F | Resp 12 | Ht 60.75 in | Wt 143.7 lb

## 2014-08-10 DIAGNOSIS — C50419 Malignant neoplasm of upper-outer quadrant of unspecified female breast: Secondary | ICD-10-CM | POA: Insufficient documentation

## 2014-08-10 DIAGNOSIS — C50412 Malignant neoplasm of upper-outer quadrant of left female breast: Secondary | ICD-10-CM

## 2014-08-10 NOTE — Progress Notes (Signed)
  Radiation Oncology         (336) [phone] ________________________________  Name: Shannon Serrano MRN: [id number]  Date: 08/10/2014  DOB: 10/19/1928  Follow-Up Visit Note  outpatient  CC: Mathews Argyle, MD  Haywood Lasso, MD  Diagnosis and Prior Radiotherapy:   Hi-Grade DCIS - Left Breast; ER+  Interval Since Last Radiation: She completed 42.56 Gy/16 fractions on 08-11-10  Narrative:  The patient returns today for routine follow-up.  Annual mammogram was BI-RADS CATEGORY 2:  on 06/27/2014.  She has denies any new lumps or bumps in her breasts.   She has retired from her job at the post office. She is grieving the loss of her husband - he died in May 08, 2023. She is in grief counseling.  ALLERGIES:  is allergic to codeine; morphine and related; penicillins; percocet; and sulfa antibiotics.  Meds: Current Outpatient Prescriptions  Medication Sig Dispense Refill  . aspirin 81 MG tablet Take 81 mg by mouth daily.      . B Complex Vitamins (B COMPLEX PO) Take by mouth.    . Calcium Carb-Cholecalciferol (CALCIUM 1000 + D PO) Take 1,200 tablets by mouth daily.      . Cholecalciferol (VITAMIN D PO) Take by mouth.    . co-enzyme Q-10 30 MG capsule Take 30 mg by mouth daily.     . cyclobenzaprine (FLEXERIL) 10 MG tablet TAKE 1 TABLET BY MOUTH THREE TIMES DAILY AS NEEDED FOR MUSCLE SPASMS 30 tablet 0  . lisinopril (PRINIVIL,ZESTRIL) 20 MG tablet Take 20 mg by mouth 2 (two) times daily. 1 tablet daily    . metoprolol (LOPRESSOR) 50 MG tablet TAKE 1 TABLET BY MOUTH TWICE DAILY 180 tablet 1  . Multiple Vitamins-Minerals (MULTIVITAMIN PO) Take by mouth.    Marland Kitchen ZETIA 10 MG tablet TAKE 1 TABLET BY MOUTH DAILY 30 tablet 1  . ciprofloxacin (CIPRO) 500 MG tablet Take 500 mg by mouth as needed.    . nitrofurantoin (MACRODANTIN) 50 MG capsule Take 50 mg by mouth as needed.    . valACYclovir (VALTREX) 500 MG tablet Take 1 tablet (500 mg total) by mouth 2 (two) times daily. (Patient not taking: Reported  on 08/10/2014) 6 tablet 3   No current facility-administered medications for this encounter.    Physical Findings: The patient is in no acute distress. Patient is alert and oriented.  height is 5' 0.75" (1.543 m) and weight is 143 lb 11.2 oz (65.182 kg). Her oral temperature is 98.1 F (36.7 C). Her blood pressure is 121/61 and her pulse is 78. Her respiration is 12. .   Neck without lumps. No palpable axillary lymphadenopathy bilaterally. No palpable lesions of concern in either breast.   Lab Findings: Lab Results  Component Value Date   WBC 7.0 09/01/2011   HGB 13.3 09/01/2011   HCT 39.1 09/01/2011   MCV 95.1 09/01/2011   PLT 228 09/01/2011    Radiographic Findings: As above  Impression/Plan:  She is doing very well with no evidence of disease. Continue grief counseling. We spoke for a while about the loss of her dear husband.  I recommended that she continue with annual mammography. I will see her back in approximately one year. I encouraged her to call us with any questions or concerns in the interim.  I spent 25 minutes face to face with the patient and more than 50% of that time was spent in counseling and/or coordination of care. _____________________________________   Eppie Gibson, MD

## 2014-08-10 NOTE — Progress Notes (Signed)
Shannon Serrano here for follow up after treatment to her left breast.  She reports occasional pain in her lower back that she says was injured when taking care of her husband.  She reports her husband passed away in 05-21-2023.  She reports she is depressed since he passed and has a poor appetite.  She has lost 10 lbs since last year.  She reports occasional hot flashes at night.  She is not taking tamoxifen or Arimidex.  She reports occasional fatigue.  The skin on her left breast is intact.  BP 121/61 mmHg  Pulse 78  Temp(Src) 98.1 F (36.7 C) (Oral)  Resp 12  Ht 5' 0.75" (1.543 m)  Wt 143 lb 11.2 oz (65.182 kg)  BMI 27.38 kg/m2  LMP 06/29/1976

## 2014-08-14 ENCOUNTER — Telehealth: Payer: Self-pay | Admitting: *Deleted

## 2014-08-14 NOTE — Telephone Encounter (Signed)
CALLED PATIENT TO INFORM OF MAMMOGRAM ON 07-04-15 - ARRIVAL TIME - 1:30 PM [facility] AND HER FU VISIT ON 07-05-15 @ 11 AM WITH DR. Isidore Moos, SPOKE WITH PATIENT AND SHE IS AWARE OF THESE APPTS.

## 2014-12-24 ENCOUNTER — Other Ambulatory Visit: Payer: Self-pay

## 2015-02-05 ENCOUNTER — Ambulatory Visit
Admission: RE | Admit: 2015-02-05 | Discharge: 2015-02-05 | Disposition: A | Discharge status: Home / Self Care | Payer: Federal, State, Local not specified - PPO | Source: Ambulatory Visit | Attending: Geriatric Medicine | Admitting: Geriatric Medicine

## 2015-02-05 ENCOUNTER — Other Ambulatory Visit: Payer: Self-pay | Admitting: Geriatric Medicine

## 2015-02-05 DIAGNOSIS — R059 Cough, unspecified: Secondary | ICD-10-CM

## 2015-02-05 DIAGNOSIS — R05 Cough: Secondary | ICD-10-CM

## 2015-02-08 ENCOUNTER — Ambulatory Visit: Payer: Federal, State, Local not specified - PPO | Admitting: Gynecology

## 2015-02-26 ENCOUNTER — Ambulatory Visit: Payer: Federal, State, Local not specified - PPO | Admitting: Certified Nurse Midwife

## 2015-02-27 ENCOUNTER — Ambulatory Visit (INDEPENDENT_AMBULATORY_CARE_PROVIDER_SITE_OTHER): Payer: Federal, State, Local not specified - PPO | Admitting: Certified Nurse Midwife

## 2015-02-27 ENCOUNTER — Encounter: Payer: Self-pay | Admitting: Certified Nurse Midwife

## 2015-02-27 VITALS — BP 106/62 | HR 72 | Resp 16 | Ht 60.25 in | Wt 134.0 lb

## 2015-02-27 DIAGNOSIS — Z01419 Encounter for gynecological examination (general) (routine) without abnormal findings: Secondary | ICD-10-CM | POA: Diagnosis not present

## 2015-02-27 NOTE — Progress Notes (Signed)
81 y.o. [id number] Widowed  Caucasian Fe here for annual exam.Menopausal no HRT. Denies vaginal bleeding or occasional vaginal dryness. Sees Dr. Felipa Eth as PCP yearly, but now being followed for venous insufficiency problems and having blood pressure and lab problems with. PCP manages hypertension,cholesterol and labs/aex. No HSV outbreaks in past year.  Ambulates without problems and exercises daily. Bone Density due. Seeing grief counselor to help with loss of spuse of 64 years in the past year. Family supportive. No other health issues today.   Patient's last menstrual period was 06/29/1976.          Sexually active: No.  The current method of family planning is status post hysterectomy.    Exercising: Yes.    bicycle, walking & home exercises Smoker:  no  Health Maintenance: Pap:  05-23-2001  MMG: 06-27-14  Category b density, birads 2:neg Colonoscopy:  2013 negative 10 years BMD:   2011  TDaP:  Up to date with pcp Labs: pcp Self breast exam: done occ   reports that she has never smoked. She does not have any smokeless tobacco history on file. She reports that she does not drink alcohol or use illicit drugs.  Past Medical History  Diagnosis Date  . Hypertension   . Skin cancer, basal cell     leg  . Hearing loss   . Syncope   . Hyperlipidemia     History of hyperlipidemia  . History of fibromyalgia   . History of hysterectomy     History of hysterectomy for fibroids She still has her ovaries  . Stroke 12/31/2007    mini stroke / has had several   . Single kidney     RIGHT SIDE WITH NONFUNCTIONING LEFT KIDNEY  . S/P radiation therapy 07/22/10 - 08/11/10    Whole Left Breast/42.56 Gy/16 Fradctions  . DCIS (ductal carcinoma in situ) 2011    left breast  . Global amnesia 11/2006  . DVT (deep venous thrombosis) 11/2007    Left leg- on lovenox x 1 injection only   . Skin cancer L Lower Leg  . Diverticulosis   . Clavicle fracture 04/2004    Past Surgical History  Procedure  Laterality Date  . Appendectomy  1948  . Doppler echocardiography  05/20/2007    EF 55-60%  . Intracapsular cataract extraction Bilateral     She also had cataract surgery on the right eye  . Vaginal hysterectomy  1978    fibroids  . Laparoscopic cholecystectomy  2001; 2004  . Eye surgery  08;09    excessive oil film removed from eyes  . Breast lumpectomy Left 06/18/10     LEFT BREAST -HIGHG RADE DUCTAL CARCINOMA INSITU, ER+, PR+,    Current Outpatient Prescriptions  Medication Sig Dispense Refill  . aspirin 81 MG tablet Take 81 mg by mouth daily.      . B Complex Vitamins (B COMPLEX PO) Take by mouth.    . Calcium Carb-Cholecalciferol (CALCIUM 1000 + D PO) Take 1,200 tablets by mouth daily.      . Cholecalciferol (VITAMIN D PO) Take by mouth.    . ciprofloxacin (CIPRO) 500 MG tablet Take 500 mg by mouth as needed.    Marland Kitchen co-enzyme Q-10 30 MG capsule Take 30 mg by mouth daily.     . cyclobenzaprine (FLEXERIL) 10 MG tablet TAKE 1 TABLET BY MOUTH THREE TIMES DAILY AS NEEDED FOR MUSCLE SPASMS 30 tablet 0  . lisinopril (PRINIVIL,ZESTRIL) 20 MG tablet Take 20 mg by mouth.  Take 1/2 tablet daily    . metoprolol (LOPRESSOR) 50 MG tablet TAKE 1 TABLET BY MOUTH TWICE DAILY (Patient taking differently: take 1/2 tablet twice daily) 180 tablet 1  . Multiple Vitamins-Minerals (MULTIVITAMIN PO) Take by mouth.    . valACYclovir (VALTREX) 500 MG tablet Take 1 tablet (500 mg total) by mouth 2 (two) times daily. 6 tablet 3  . ZETIA 10 MG tablet TAKE 1 TABLET BY MOUTH DAILY 30 tablet 1   No current facility-administered medications for this visit.    Family History  Problem Relation Age of Onset  . Heart disease Mother   . Hypertension Mother   . Diabetes Father   . Heart disease Father   . Colon cancer Sister   . Pancreatic cancer Brother   . Pancreatic cancer Sister   . Brain cancer Brother   . Heart attack Sister   . Uterine cancer Sister     ROS:  Pertinent items are noted in HPI.   Otherwise, a comprehensive ROS was negative.  Exam:   BP 106/62 mmHg  Pulse 72  Resp 16  Ht 5' 0.25" (1.53 m)  Wt 134 lb (60.782 kg)  BMI 25.97 kg/m2  LMP 06/29/1976 Height: 5' 0.25" (153 cm) Ht Readings from Last 3 Encounters:  02/27/15 5' 0.25" (1.53 m)  08/10/14 5' 0.75" (1.543 m)  02/05/14 5' 0.75" (1.543 m)    General appearance: alert, cooperative and appears stated age Head: Normocephalic, without obvious abnormality, atraumatic Neck: no adenopathy, supple, symmetrical, trachea midline and thyroid normal to inspection and palpation Lungs: clear to auscultation bilaterally Breasts: normal appearance, no masses or tenderness, No nipple retraction or dimpling, No nipple discharge or bleeding, No axillary or supraclavicular adenopathy Heart: regular rate and rhythm Abdomen: soft, non-tender; no masses,  no organomegaly Extremities: extremities normal, atraumatic, no cyanosis or edema Skin: Skin color, texture, turgor normal. No rashes or lesions Lymph nodes: Cervical, supraclavicular, and axillary nodes normal. No abnormal inguinal nodes palpated Neurologic: Grossly normal   Pelvic: External genitalia:  no lesions, atrophic              Urethra:  normal appearing urethra with no masses, tenderness or lesions              Bartholin's and Skene's: normal                 Vagina:atrophicl appearing vagina with normal color and discharge, no lesions, moisture present              Cervix: absent              Pap taken: No. Bimanual Exam:  Uterus:  uterus absent              Adnexa: normal adnexa and no mass, fullness, tenderness               Rectovaginal: Confirms               Anus:  normal sphincter tone, no lesions  Chaperone present: yes  A:  Well Woman with normal exam  Menopausal no HRT s/p TAH with ovaries retained( for fibroids)  Left breast DCIS history, no treatment now mammograms yearly  Hypertension/cholesterol/venous insufficiency with PCP management.  Social  stress with loss of spouse  P:   Reviewed health and wellness pertinent to exam  Aware of need to evaluate if vaginal bleeding or dryness issues. Discussed coconut oil use prn for dryness and how to use. Patient may try.  Continue with yearly mammograms and SBE  Follow up with MD as indicated  Discussed Hospice Grief and home service for help with adjustment of loss. Seek son's support as well as friends as needed. Focus on her new pet to help fill her day.  Pap smear as above  Not taken   counseled on breast self exam, mammography screening, adequate intake of calcium and vitamin D, diet and exercise. BMD due, plans to do with mammogram and will call if problems scheduling, she prefers to do this herself.  return annually or prn  An After Visit Summary was printed and given to the patient.

## 2015-02-27 NOTE — Patient Instructions (Signed)

## 2015-02-27 NOTE — Progress Notes (Signed)
Reviewed personally.  M. Suzanne Warden Buffa, MD.  

## 2015-03-01 ENCOUNTER — Telehealth: Payer: Self-pay | Admitting: Certified Nurse Midwife

## 2015-03-01 DIAGNOSIS — E2839 Other primary ovarian failure: Secondary | ICD-10-CM

## 2015-03-01 DIAGNOSIS — Z780 Asymptomatic menopausal state: Secondary | ICD-10-CM

## 2015-03-01 NOTE — Telephone Encounter (Signed)
Spoke with the [facility] BMD added to mammogram appointment on 07/04/2015. New arrival time 12:45 pm at [facility]. Spoke with patient advised I have added her BMD to her mammogram appointment. Patient is agreeable to time adjustment. Advised no calcium supplements 48 hours prior to BMD. Patient is agreeable.  Routing to provider for final review. Patient agreeable to disposition. Will close encounter.

## 2015-03-01 NOTE — Telephone Encounter (Signed)
Patient calling requesting a bone density with her MMG on 07/04/15 at 1:30 at [facility]. She said, "The facility said the doctor's office has to schedule this."

## 2015-03-13 ENCOUNTER — Other Ambulatory Visit: Payer: Self-pay | Admitting: Geriatric Medicine

## 2015-03-13 DIAGNOSIS — R1011 Right upper quadrant pain: Secondary | ICD-10-CM

## 2015-03-18 ENCOUNTER — Ambulatory Visit
Admission: RE | Admit: 2015-03-18 | Discharge: 2015-03-18 | Disposition: A | Discharge status: Home / Self Care | Payer: Federal, State, Local not specified - PPO | Source: Ambulatory Visit | Attending: Geriatric Medicine | Admitting: Geriatric Medicine

## 2015-03-18 ENCOUNTER — Other Ambulatory Visit: Payer: Self-pay | Admitting: Geriatric Medicine

## 2015-03-18 DIAGNOSIS — R1011 Right upper quadrant pain: Secondary | ICD-10-CM

## 2015-04-16 ENCOUNTER — Ambulatory Visit
Admission: RE | Admit: 2015-04-16 | Discharge: 2015-04-16 | Disposition: A | Discharge status: Home / Self Care | Payer: Federal, State, Local not specified - PPO | Source: Ambulatory Visit | Attending: Internal Medicine | Admitting: Internal Medicine

## 2015-04-16 ENCOUNTER — Other Ambulatory Visit: Payer: Self-pay | Admitting: Internal Medicine

## 2015-04-16 DIAGNOSIS — M79605 Pain in left leg: Secondary | ICD-10-CM

## 2015-05-17 ENCOUNTER — Other Ambulatory Visit: Payer: Self-pay | Admitting: Geriatric Medicine

## 2015-05-17 ENCOUNTER — Ambulatory Visit
Admission: RE | Admit: 2015-05-17 | Discharge: 2015-05-17 | Disposition: A | Discharge status: Home / Self Care | Payer: Federal, State, Local not specified - PPO | Source: Ambulatory Visit | Attending: Geriatric Medicine | Admitting: Geriatric Medicine

## 2015-05-17 DIAGNOSIS — M25552 Pain in left hip: Secondary | ICD-10-CM

## 2015-05-17 DIAGNOSIS — M25562 Pain in left knee: Secondary | ICD-10-CM

## 2015-05-21 ENCOUNTER — Ambulatory Visit
Admission: RE | Admit: 2015-05-21 | Discharge: 2015-05-21 | Disposition: A | Discharge status: Home / Self Care | Payer: Federal, State, Local not specified - PPO | Source: Ambulatory Visit | Attending: Geriatric Medicine | Admitting: Geriatric Medicine

## 2015-05-21 ENCOUNTER — Other Ambulatory Visit: Payer: Self-pay | Admitting: Geriatric Medicine

## 2015-05-21 DIAGNOSIS — M7989 Other specified soft tissue disorders: Principal | ICD-10-CM

## 2015-05-21 DIAGNOSIS — M79662 Pain in left lower leg: Secondary | ICD-10-CM

## 2015-06-14 DIAGNOSIS — M797 Fibromyalgia: Secondary | ICD-10-CM | POA: Diagnosis not present

## 2015-06-14 DIAGNOSIS — H9193 Unspecified hearing loss, bilateral: Secondary | ICD-10-CM | POA: Diagnosis not present

## 2015-06-14 DIAGNOSIS — N183 Chronic kidney disease, stage 3 (moderate): Secondary | ICD-10-CM | POA: Diagnosis not present

## 2015-06-14 DIAGNOSIS — M25552 Pain in left hip: Secondary | ICD-10-CM | POA: Diagnosis not present

## 2015-06-14 DIAGNOSIS — M25562 Pain in left knee: Secondary | ICD-10-CM | POA: Diagnosis not present

## 2015-06-14 DIAGNOSIS — I129 Hypertensive chronic kidney disease with stage 1 through stage 4 chronic kidney disease, or unspecified chronic kidney disease: Secondary | ICD-10-CM | POA: Diagnosis not present

## 2015-06-18 DIAGNOSIS — I129 Hypertensive chronic kidney disease with stage 1 through stage 4 chronic kidney disease, or unspecified chronic kidney disease: Secondary | ICD-10-CM | POA: Diagnosis not present

## 2015-06-18 DIAGNOSIS — H9193 Unspecified hearing loss, bilateral: Secondary | ICD-10-CM | POA: Diagnosis not present

## 2015-06-18 DIAGNOSIS — M25562 Pain in left knee: Secondary | ICD-10-CM | POA: Diagnosis not present

## 2015-06-18 DIAGNOSIS — N183 Chronic kidney disease, stage 3 (moderate): Secondary | ICD-10-CM | POA: Diagnosis not present

## 2015-06-18 DIAGNOSIS — M797 Fibromyalgia: Secondary | ICD-10-CM | POA: Diagnosis not present

## 2015-06-18 DIAGNOSIS — M25552 Pain in left hip: Secondary | ICD-10-CM | POA: Diagnosis not present

## 2015-06-21 DIAGNOSIS — M25552 Pain in left hip: Secondary | ICD-10-CM | POA: Diagnosis not present

## 2015-06-21 DIAGNOSIS — M25562 Pain in left knee: Secondary | ICD-10-CM | POA: Diagnosis not present

## 2015-06-21 DIAGNOSIS — M797 Fibromyalgia: Secondary | ICD-10-CM | POA: Diagnosis not present

## 2015-06-21 DIAGNOSIS — N183 Chronic kidney disease, stage 3 (moderate): Secondary | ICD-10-CM | POA: Diagnosis not present

## 2015-06-21 DIAGNOSIS — H9193 Unspecified hearing loss, bilateral: Secondary | ICD-10-CM | POA: Diagnosis not present

## 2015-06-21 DIAGNOSIS — I129 Hypertensive chronic kidney disease with stage 1 through stage 4 chronic kidney disease, or unspecified chronic kidney disease: Secondary | ICD-10-CM | POA: Diagnosis not present

## 2015-06-25 DIAGNOSIS — M25552 Pain in left hip: Secondary | ICD-10-CM | POA: Diagnosis not present

## 2015-06-25 DIAGNOSIS — M797 Fibromyalgia: Secondary | ICD-10-CM | POA: Diagnosis not present

## 2015-06-25 DIAGNOSIS — I129 Hypertensive chronic kidney disease with stage 1 through stage 4 chronic kidney disease, or unspecified chronic kidney disease: Secondary | ICD-10-CM | POA: Diagnosis not present

## 2015-06-25 DIAGNOSIS — N183 Chronic kidney disease, stage 3 (moderate): Secondary | ICD-10-CM | POA: Diagnosis not present

## 2015-06-25 DIAGNOSIS — H9193 Unspecified hearing loss, bilateral: Secondary | ICD-10-CM | POA: Diagnosis not present

## 2015-06-25 DIAGNOSIS — M25562 Pain in left knee: Secondary | ICD-10-CM | POA: Diagnosis not present

## 2015-06-27 DIAGNOSIS — I129 Hypertensive chronic kidney disease with stage 1 through stage 4 chronic kidney disease, or unspecified chronic kidney disease: Secondary | ICD-10-CM | POA: Diagnosis not present

## 2015-06-27 DIAGNOSIS — M25552 Pain in left hip: Secondary | ICD-10-CM | POA: Diagnosis not present

## 2015-06-27 DIAGNOSIS — N183 Chronic kidney disease, stage 3 (moderate): Secondary | ICD-10-CM | POA: Diagnosis not present

## 2015-06-27 DIAGNOSIS — M25562 Pain in left knee: Secondary | ICD-10-CM | POA: Diagnosis not present

## 2015-06-27 DIAGNOSIS — H9193 Unspecified hearing loss, bilateral: Secondary | ICD-10-CM | POA: Diagnosis not present

## 2015-06-27 DIAGNOSIS — M797 Fibromyalgia: Secondary | ICD-10-CM | POA: Diagnosis not present

## 2015-07-02 DIAGNOSIS — H9193 Unspecified hearing loss, bilateral: Secondary | ICD-10-CM | POA: Diagnosis not present

## 2015-07-02 DIAGNOSIS — M25552 Pain in left hip: Secondary | ICD-10-CM | POA: Diagnosis not present

## 2015-07-02 DIAGNOSIS — N183 Chronic kidney disease, stage 3 (moderate): Secondary | ICD-10-CM | POA: Diagnosis not present

## 2015-07-02 DIAGNOSIS — M25562 Pain in left knee: Secondary | ICD-10-CM | POA: Diagnosis not present

## 2015-07-02 DIAGNOSIS — M797 Fibromyalgia: Secondary | ICD-10-CM | POA: Diagnosis not present

## 2015-07-02 DIAGNOSIS — I129 Hypertensive chronic kidney disease with stage 1 through stage 4 chronic kidney disease, or unspecified chronic kidney disease: Secondary | ICD-10-CM | POA: Diagnosis not present

## 2015-07-03 DIAGNOSIS — I129 Hypertensive chronic kidney disease with stage 1 through stage 4 chronic kidney disease, or unspecified chronic kidney disease: Secondary | ICD-10-CM | POA: Diagnosis not present

## 2015-07-03 DIAGNOSIS — H9193 Unspecified hearing loss, bilateral: Secondary | ICD-10-CM | POA: Diagnosis not present

## 2015-07-03 DIAGNOSIS — M797 Fibromyalgia: Secondary | ICD-10-CM | POA: Diagnosis not present

## 2015-07-03 DIAGNOSIS — N183 Chronic kidney disease, stage 3 (moderate): Secondary | ICD-10-CM | POA: Diagnosis not present

## 2015-07-03 DIAGNOSIS — M25552 Pain in left hip: Secondary | ICD-10-CM | POA: Diagnosis not present

## 2015-07-03 DIAGNOSIS — M25562 Pain in left knee: Secondary | ICD-10-CM | POA: Diagnosis not present

## 2015-07-04 ENCOUNTER — Other Ambulatory Visit: Payer: Self-pay | Admitting: Radiation Oncology

## 2015-07-04 ENCOUNTER — Ambulatory Visit
Admission: RE | Admit: 2015-07-04 | Discharge: 2015-07-04 | Disposition: A | Discharge status: Home / Self Care | Payer: Federal, State, Local not specified - PPO | Source: Ambulatory Visit | Attending: Certified Nurse Midwife | Admitting: Certified Nurse Midwife

## 2015-07-04 ENCOUNTER — Ambulatory Visit
Admission: RE | Admit: 2015-07-04 | Discharge: 2015-07-04 | Disposition: A | Discharge status: Home / Self Care | Payer: Federal, State, Local not specified - PPO | Source: Ambulatory Visit | Attending: Radiation Oncology | Admitting: Radiation Oncology

## 2015-07-04 DIAGNOSIS — C50412 Malignant neoplasm of upper-outer quadrant of left female breast: Secondary | ICD-10-CM

## 2015-07-04 DIAGNOSIS — Z780 Asymptomatic menopausal state: Secondary | ICD-10-CM

## 2015-07-04 DIAGNOSIS — E2839 Other primary ovarian failure: Secondary | ICD-10-CM

## 2015-07-05 ENCOUNTER — Ambulatory Visit: Payer: Federal, State, Local not specified - PPO | Admitting: Radiation Oncology

## 2015-07-11 DIAGNOSIS — I129 Hypertensive chronic kidney disease with stage 1 through stage 4 chronic kidney disease, or unspecified chronic kidney disease: Secondary | ICD-10-CM | POA: Diagnosis not present

## 2015-07-11 DIAGNOSIS — H9193 Unspecified hearing loss, bilateral: Secondary | ICD-10-CM | POA: Diagnosis not present

## 2015-07-11 DIAGNOSIS — N183 Chronic kidney disease, stage 3 (moderate): Secondary | ICD-10-CM | POA: Diagnosis not present

## 2015-07-11 DIAGNOSIS — M797 Fibromyalgia: Secondary | ICD-10-CM | POA: Diagnosis not present

## 2015-07-11 DIAGNOSIS — M25552 Pain in left hip: Secondary | ICD-10-CM | POA: Diagnosis not present

## 2015-07-11 DIAGNOSIS — M25562 Pain in left knee: Secondary | ICD-10-CM | POA: Diagnosis not present

## 2015-07-15 ENCOUNTER — Other Ambulatory Visit: Payer: Self-pay | Admitting: Certified Nurse Midwife

## 2015-07-16 DIAGNOSIS — H9193 Unspecified hearing loss, bilateral: Secondary | ICD-10-CM | POA: Diagnosis not present

## 2015-07-16 DIAGNOSIS — M797 Fibromyalgia: Secondary | ICD-10-CM | POA: Diagnosis not present

## 2015-07-16 DIAGNOSIS — N183 Chronic kidney disease, stage 3 (moderate): Secondary | ICD-10-CM | POA: Diagnosis not present

## 2015-07-16 DIAGNOSIS — I129 Hypertensive chronic kidney disease with stage 1 through stage 4 chronic kidney disease, or unspecified chronic kidney disease: Secondary | ICD-10-CM | POA: Diagnosis not present

## 2015-07-16 DIAGNOSIS — M25562 Pain in left knee: Secondary | ICD-10-CM | POA: Diagnosis not present

## 2015-07-16 DIAGNOSIS — M25552 Pain in left hip: Secondary | ICD-10-CM | POA: Diagnosis not present

## 2015-07-16 NOTE — Telephone Encounter (Signed)
Medication refill request: Valtrex Last AEX:  02-27-15 Next AEX: 03-04-16 Last MMG (if hormonal medication request): 07-04-15 [facility] Refill authorized: please advise

## 2015-07-17 ENCOUNTER — Encounter: Payer: Self-pay | Admitting: Radiation Oncology

## 2015-07-17 ENCOUNTER — Ambulatory Visit
Admission: RE | Admit: 2015-07-17 | Discharge: 2015-07-17 | Disposition: A | Discharge status: Home / Self Care | Payer: Federal, State, Local not specified - PPO | Source: Ambulatory Visit | Attending: Radiation Oncology | Admitting: Radiation Oncology

## 2015-07-17 VITALS — BP 126/54 | HR 72 | Temp 97.9°F | Wt 139.0 lb

## 2015-07-17 DIAGNOSIS — C50412 Malignant neoplasm of upper-outer quadrant of left female breast: Secondary | ICD-10-CM

## 2015-07-17 NOTE — Progress Notes (Signed)
  Radiation Oncology         [phone] ________________________________  Name: Shannon Serrano MRN: [id number]  Date: 07/17/2015  DOB: Mar 20, 1929  Follow-Up Visit Note  outpatient  CC: Mathews Argyle, MD  Melvia Heaps CNM   ICD-9-CM ICD-10-CM   1. Malignant neoplasm of upper-outer quadrant of left female breast ([place]) 174.4 C50.412     Diagnosis and Prior Radiotherapy:   Hi-Grade DCIS - Left Breast; ER+  Interval Since Last Radiation: She completed 42.56 Gy/16 fractions on 08-11-10  Narrative:  The patient returns today for routine follow-up.  Annual mammogram was BI-RADS CATEGORY 2 this month.  She has denies any new lumps or bumps in her breasts. She is coping with Sciatica, followed by orthopedics, and getting PT.  ALLERGIES:  is allergic to codeine; morphine and related; penicillins; percocet; and sulfa antibiotics.  Meds: Current Outpatient Prescriptions  Medication Sig Dispense Refill  . aspirin 81 MG tablet Take 81 mg by mouth daily.      . B Complex Vitamins (B COMPLEX PO) Take by mouth.    . Calcium Carb-Cholecalciferol (CALCIUM 1000 + D PO) Take 1,200 tablets by mouth daily.      . Cholecalciferol (VITAMIN D PO) Take by mouth.    . co-enzyme Q-10 30 MG capsule Take 30 mg by mouth daily.     . cyclobenzaprine (FLEXERIL) 10 MG tablet TAKE 1 TABLET BY MOUTH THREE TIMES DAILY AS NEEDED FOR MUSCLE SPASMS 30 tablet 0  . lisinopril (PRINIVIL,ZESTRIL) 20 MG tablet Take 20 mg by mouth. She takes 1/2 pill morning and night. 20mg  total each day.    . metoprolol (LOPRESSOR) 50 MG tablet TAKE 1 TABLET BY MOUTH TWICE DAILY (Patient taking differently: take 1/2 tablet twice daily) 180 tablet 1  . Multiple Vitamins-Minerals (MULTIVITAMIN PO) Take by mouth.    . valACYclovir (VALTREX) 500 MG tablet TAKE 1 TABLET BY MOUTH TWICE DAILY 30 tablet 2  . ZETIA 10 MG tablet TAKE 1 TABLET BY MOUTH DAILY 30 tablet 1  . ciprofloxacin (CIPRO) 500 MG tablet Take 500 mg by mouth as needed.  Reported on 07/17/2015     No current facility-administered medications for this encounter.    Physical Findings: The patient is in no acute distress.  Gen: Patient is alert and oriented.  weight is 139 lb (63.05 kg). Her temperature is 97.9 F (36.6 C). Her blood pressure is 126/54 and her pulse is 72. .   Neck without lumps. No palpable axillary lymphadenopathy bilaterally. No palpable lesions appreciated in either breast. Heart RRR without murmurs. Chest CTAB.  Ext: no edema.    Lab Findings: Lab Results  Component Value Date   WBC 7.0 09/01/2011   HGB 13.3 09/01/2011   HCT 39.1 09/01/2011   MCV 95.1 09/01/2011   PLT 228 09/01/2011    Radiographic Findings: As above  Impression/Plan:  L breast DCIS, 5 years out from curative treatment. She is doing very well with no evidence of disease. I encouraged her to continue with yearly mammography and followup with her other physicians and NPs and CNMs.  She says she is receiving yearly breast exams in Gynecology. I will see her back on an as-needed basis. I have encouraged her to call if she has any issues or concerns in the future. I wished her the very best.  30 minutes spent face to face with patient, of 50% on counseling and coordination of care.  _____________________________________   Eppie Gibson, MD

## 2015-07-17 NOTE — Progress Notes (Signed)
Shannon Serrano is here for follow up of radiation completed 08/11/10 to her Left Breast. She complains of pain in her Left Back, Left Leg and feet. She has been diagnosed with sciatica, and has received several shots for relief. She is able to ambulate, and complete her normal activities at this time. She is cautious, and doing physical therapy and water aerobics for exercise. She is here to get her results of her mammogram she had 07/04/15.  BP 126/54 mmHg  Pulse 72  Temp(Src) 97.9 F (36.6 C)  Wt 139 lb (63.05 kg)  LMP 06/29/1976   Wt Readings from Last 3 Encounters:  07/17/15 139 lb (63.05 kg)  02/27/15 134 lb (60.782 kg)  08/10/14 143 lb 11.2 oz (65.182 kg)

## 2015-07-18 DIAGNOSIS — H9193 Unspecified hearing loss, bilateral: Secondary | ICD-10-CM | POA: Diagnosis not present

## 2015-07-18 DIAGNOSIS — M797 Fibromyalgia: Secondary | ICD-10-CM | POA: Diagnosis not present

## 2015-07-18 DIAGNOSIS — I129 Hypertensive chronic kidney disease with stage 1 through stage 4 chronic kidney disease, or unspecified chronic kidney disease: Secondary | ICD-10-CM | POA: Diagnosis not present

## 2015-07-18 DIAGNOSIS — M25552 Pain in left hip: Secondary | ICD-10-CM | POA: Diagnosis not present

## 2015-07-18 DIAGNOSIS — N183 Chronic kidney disease, stage 3 (moderate): Secondary | ICD-10-CM | POA: Diagnosis not present

## 2015-07-18 DIAGNOSIS — M25562 Pain in left knee: Secondary | ICD-10-CM | POA: Diagnosis not present

## 2015-10-12 ENCOUNTER — Other Ambulatory Visit: Payer: Self-pay | Admitting: Certified Nurse Midwife

## 2015-10-14 NOTE — Telephone Encounter (Signed)
Medication refill request: Valtrex Last AEX:  02-27-15 Next AEX: 03-04-16  Last MMG (if hormonal medication request): 07-04-15 WNL  Refill authorized: please advise

## 2015-10-27 NOTE — Progress Notes (Signed)
Cardiology Office Note    Date:  10/30/2015   ID:  SYMIAH SOCARRAS, DOB 12/31/1928, MRN [id number]  PCP:  Mathews Argyle, MD  Cardiologist: Dr. Mare Ferrari   Chest pain/SOB  History of Present Illness:  Shannon Serrano is a 82 y.o. 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   She was last seen by Dr. Mare Ferrari in 06/2013 for evaluation of jaw pain. He ordered a lexiscan myoview which was low risk for ischemia w/ EF 85%.    She has not been seen since. Today she presents for evaluation of chest pain. She has been bothered by chest tightness when she is sitting still. Not worse with exertion. She rides a stationary bike and does not get any chest tightness. It is worse with deep breath in. She is feeling more SOB as well.  SHe does note worsening DOE and decreased exercise tolerance. Sometimes she wakes up in the middle of the night gasping for air. She has had some minor swelling her left leg that is not worse than usual. She did have some bilateral ankle swelling last fall. She has also been propping up on pillows. ALso having some muscle cramps and burning in her legs. Not worse with exertion. No dizzines or synope.   Past Medical History  Diagnosis Date  . Hypertension   . Skin cancer, basal cell     leg  . Hearing loss   . Syncope   . Hyperlipidemia     History of hyperlipidemia  . History of fibromyalgia   . History of hysterectomy     History of hysterectomy for fibroids She still has her ovaries  . Stroke ([place]) 12/31/2007    mini stroke / has had several   . Single kidney     RIGHT SIDE WITH NONFUNCTIONING LEFT KIDNEY  . S/P radiation therapy 07/22/10 - 08/11/10    Whole Left Breast/42.56 Gy/16 Fradctions  . DCIS (ductal carcinoma in situ) 2011    left breast  . Global amnesia 11/2006  . DVT (deep venous thrombosis) ([place]) 11/2007     Left leg- on lovenox x 1 injection only   . Skin cancer L Lower Leg  . Diverticulosis   . Clavicle fracture 04/2004    Past Surgical History  Procedure Laterality Date  . Appendectomy  1948  . Doppler echocardiography  05/20/2007    EF 55-60%  . Intracapsular cataract extraction Bilateral     She also had cataract surgery on the right eye  . Vaginal hysterectomy  1978    fibroids  . Laparoscopic cholecystectomy  2001; 2004  . Eye surgery  08;09    excessive oil film removed from eyes  . Breast lumpectomy Left 06/18/10     LEFT BREAST -HIGHG RADE DUCTAL CARCINOMA INSITU, ER+, PR+,    Current Medications: Outpatient Prescriptions Prior to Visit  Medication Sig Dispense Refill  . aspirin 81 MG tablet Take 81 mg by mouth daily.      . B Complex Vitamins (B COMPLEX PO) Take 1 Dose by mouth daily.     . Calcium Carb-Cholecalciferol (CALCIUM 1000 + D PO) Take 1,200 tablets by mouth daily.      . Cholecalciferol (VITAMIN D PO) Take 1 Dose by mouth daily.     . ciprofloxacin (CIPRO) 500 MG tablet Take 500 mg by mouth 2 (  two) times daily. Reported on 07/17/2015    . co-enzyme Q-10 30 MG capsule Take 30 mg by mouth daily.     . cyclobenzaprine (FLEXERIL) 10 MG tablet TAKE 1 TABLET BY MOUTH THREE TIMES DAILY AS NEEDED FOR MUSCLE SPASMS 30 tablet 0  . metoprolol (LOPRESSOR) 50 MG tablet Take 25 mg by mouth 2 (two) times daily. Take 1/2 tablet by mouth twice daily    . Multiple Vitamins-Minerals (MULTIVITAMIN PO) Take 1 tablet by mouth.     . valACYclovir (VALTREX) 500 MG tablet Take 1 tablet (500 mg total) by mouth 2 (two) times daily. X 3 days only at onset of outbreak 30 tablet 0  . ZETIA 10 MG tablet TAKE 1 TABLET BY MOUTH DAILY 30 tablet 1  . lisinopril (PRINIVIL,ZESTRIL) 20 MG tablet Take 10 mg by mouth 2 (two) times daily. She takes 1/2 pill morning and night. 20mg  total each day.    . metoprolol (LOPRESSOR) 50 MG tablet TAKE 1 TABLET BY MOUTH TWICE DAILY (Patient taking differently:  take 1/2 tablet twice daily) 180 tablet 1   No facility-administered medications prior to visit.     Allergies:   Codeine; Eszopiclone; Morphine; Morphine and related; Oxycodone-acetaminophen; Penicillins; Percocet; Sulfa antibiotics; and Sulfamethoxazole   Social History   Social History  . Marital Status: Widowed    Spouse Name: N/A  . Number of Children: 4  . Years of Education: N/A   Occupational History  . [employer]   Social History Main Topics  . Smoking status: Never Smoker   . Smokeless tobacco: None  . Alcohol Use: No  . Drug Use: No  . Sexual Activity: No     Comment: hysterectomy   Other Topics Concern  . None   Social History Narrative     Family History:  The patient's family history includes Brain cancer in her brother; Colon cancer in her sister; Diabetes in her father; Heart attack in her sister; Heart disease in her father and mother; Hypertension in her mother; Pancreatic cancer in her brother and sister; Uterine cancer in her sister.   ROS:   Please see the history of present illness.    ROS All other systems reviewed and are negative.   PHYSICAL EXAM:   VS:  BP 128/78 mmHg  Pulse 67  Ht 5' (1.524 m)  Wt 141 lb (63.957 kg)  BMI 27.54 kg/m2  LMP 06/29/1976   GEN: Well nourished, well developed, in no acute distress HEENT: normal Neck: no JVD, carotid bruits, or masses Cardiac: RRR; no murmurs, rubs, or gallops,no edema  Respiratory:  clear to auscultation bilaterally, normal work of breathing GI: soft, nontender, nondistended, + BS MS: no deformity or atrophy Skin: warm and dry, no rash Neuro:  Alert and Oriented x 3, Strength and sensation are intact Psych: euthymic mood, full affect  Wt Readings from Last 3 Encounters:  10/30/15 141 lb (63.957 kg)  07/17/15 139 lb (63.05 kg)  02/27/15 134 lb (60.782 kg)      Studies/Labs Reviewed:   EKG:  EKG is ordered today.  The ekg ordered today demonstrates NSR HR 67   Recent  Labs: No results found for requested labs within last 365 days.   Lipid Panel    Component Value Date/Time   CHOL 175 09/17/2011 0848   TRIG 133.0 09/17/2011 0848   HDL 56.20 09/17/2011 0848   CHOLHDL 3 09/17/2011 0848   VLDL 26.6 09/17/2011 0848   LDLCALC 92 09/17/2011 0848  LDLDIRECT 167.7 05/14/2011 1047    Additional studies/ records that were reviewed today include:   06/2013 Overall Impression: This study was very challenging secondary to significant extracardiac activity that impaired ability to read. Considering that all the defects improve at stress and that LV function is vigorous with LVEF >75% and no regional wall motion abnormalities, this is most probably a normal study.  LV Ejection Fraction: 85%. LV Wall Motion: NL LV Function; NL Wall Motion  2D ECHO 2008: Normal EF, G1DD, aortic sclerosis w/o stenosis   ASSESSMENT:    1. Chest pain, unspecified chest pain type   2. SOB (shortness of breath)   3. Essential hypertension   4. HLD (hyperlipidemia)   5. History of TIA (transient ischemic attack)   6. CKD (chronic kidney disease), unspecified stage      PLAN:  In order of problems listed above:  Chest pain/SOB: She does not appear volume overloaded on exam. I will update 2D ECHO as well as BNP and get a baseline BMET. If BNP elevated will add lasix. Will update nuclear stress test although chest pain is atypical    HTN: well controlled on losartan 50mg  daily and Lopressor 50mg  BID  HLD: continue zetia. She does not tolerate statins.   Hx of TIA: continue ASA   CKD: born with one functioning kidney. Will get BMET today for baseline.   Medication Adjustments/Labs and Tests Ordered: Current medicines are reviewed at length with the patient today.  Concerns regarding medicines are outlined above.  Medication changes, Labs and Tests ordered today are listed in the Patient Instructions below. Patient Instructions  Medication Instructions:  Your physician  recommends that you continue on your current medications as directed. Please refer to the Current Medication list given to you today.   Labwork: TODAY:  BNP & BMP  Testing/Procedures: Your physician has requested that you have an echocardiogram. Echocardiography is a painless test that uses sound waves to create images of your heart. It provides your doctor with information about the size and shape of your heart and how well your heart's chambers and valves are working. This procedure takes approximately one hour. There are no restrictions for this procedure.  Your physician has requested that you have a lexiscan myoview. For further information please visit [email or web address]. Please follow instruction sheet, as given.   Follow-Up: Your physician recommends that you schedule a follow-up appointment in: WILL BE BASED UPON YOUR TEST RESULTS   Any Other Special Instructions Will Be Listed Below (If Applicable). Pharmacologic Stress Electrocardiogram A pharmacologic stress electrocardiogram is a heart (cardiac) test that uses nuclear imaging to evaluate the blood supply to your heart. This test may also be called a pharmacologic stress electrocardiography. Pharmacologic means that a medicine is used to increase your heart rate and blood pressure.  This stress test is done to find areas of poor blood flow to the heart by determining the extent of coronary artery disease (CAD). Some people exercise on a treadmill, which naturally increases the blood flow to the heart. For those people unable to exercise on a treadmill, a medicine is used. This medicine stimulates your heart and will cause your heart to beat harder and more quickly, as if you were exercising.  Pharmacologic stress tests can help determine:  The adequacy of blood flow to your heart during increased levels of activity in order to clear you for discharge home.  The extent of coronary artery blockage caused by CAD.  Your prognosis  if you have  suffered a heart attack.  The effectiveness of cardiac procedures done, such as an angioplasty, which can increase the circulation in your coronary arteries.  Causes of chest pain or pressure. LET [facility] CARE PROVIDER KNOW ABOUT:  Any allergies you have.  All medicines you are taking, including vitamins, herbs, eye drops, creams, and over-the-counter medicines.  Previous problems you or members of your family have had with the use of anesthetics.  Any blood disorders you have.  Previous surgeries you have had.  Medical conditions you have.  Possibility of pregnancy, if this applies.  If you are currently breastfeeding. RISKS AND COMPLICATIONS Generally, this is a safe procedure. However, as with any procedure, complications can occur. Possible complications include:  You develop pain or pressure in the following areas:  Chest.  Jaw or neck.  Between your shoulder blades.  Radiating down your left arm.  Headache.  Dizziness or light-headedness.  Shortness of breath.  Increased or irregular heartbeat.  Low blood pressure.  Nausea or vomiting.  Flushing.  Redness going up the arm and slight pain during injection of medicine.  Heart attack (rare). BEFORE THE PROCEDURE   Avoid all forms of caffeine for 24 hours before your test or as directed by your health care provider. This includes coffee, tea (even decaffeinated tea), caffeinated sodas, chocolate, cocoa, and certain pain medicines.  Follow your health care provider's instructions regarding eating and drinking before the test.  Take your medicines as directed at regular times with water unless instructed otherwise. Exceptions may include:  If you have diabetes, ask how you are to take your insulin or pills. It is common to adjust insulin dosing the morning of the test.  If you are taking beta-blocker medicines, it is important to talk to your health care provider about these medicines well  before the date of your test. Taking beta-blocker medicines may interfere with the test. In some cases, these medicines need to be changed or stopped 24 hours or more before the test.  If you wear a nitroglycerin patch, it may need to be removed prior to the test. Ask your health care provider if the patch should be removed before the test.  If you use an inhaler for any breathing condition, bring it with you to the test.  If you are an outpatient, bring a snack so you can eat right after the stress phase of the test.  Do not smoke for 4 hours prior to the test or as directed by your health care provider.  Do not apply lotions, powders, creams, or oils on your chest prior to the test.  Wear comfortable shoes and clothing. Let your health care provider know if you were unable to complete or follow the preparations for your test. PROCEDURE   Multiple patches (electrodes) will be put on your chest. If needed, small areas of your chest may be shaved to get better contact with the electrodes. Once the electrodes are attached to your body, multiple wires will be attached to the electrodes, and your heart rate will be monitored.  An IV access will be started. A nuclear trace (isotope) is given. The isotope may be given intravenously, or it may be swallowed. Nuclear refers to several types of radioactive isotopes, and the nuclear isotope lights up the arteries so that the nuclear images are clear. The isotope is absorbed by your body. This results in low radiation exposure.  A resting nuclear image is taken to show how your heart functions at rest.  A medicine is given through the IV access.  A second scan is done about 1 hour after the medicine injection and determines how your heart functions under stress.  During this stress phase, you will be connected to an electrocardiogram machine. Your blood pressure and oxygen levels will be monitored. AFTER THE PROCEDURE   Your heart rate and blood  pressure will be monitored after the test.  You may return to your normal schedule, including diet,activities, and medicines, unless your health care provider tells you otherwise.   This information is not intended to replace advice given to you by your health care provider. Make sure you discuss any questions you have with your health care provider.   Document Released: 11/01/2008 Document Revised: 06/20/2013 Document Reviewed: 02/20/2013 Elsevier Interactive Patient Education 2016 [employer].   Echocardiogram An echocardiogram, or echocardiography, uses sound waves (ultrasound) to produce an image of your heart. The echocardiogram is simple, painless, obtained within a short period of time, and offers valuable information to your health care provider. The images from an echocardiogram can provide information such as:  Evidence of coronary artery disease (CAD).  Heart size.  Heart muscle function.  Heart valve function.  Aneurysm detection.  Evidence of a past heart attack.  Fluid buildup around the heart.  Heart muscle thickening.  Assess heart valve function. LET [facility] CARE PROVIDER KNOW ABOUT:  Any allergies you have.  All medicines you are taking, including vitamins, herbs, eye drops, creams, and over-the-counter medicines.  Previous problems you or members of your family have had with the use of anesthetics.  Any blood disorders you have.  Previous surgeries you have had.  Medical conditions you have.  Possibility of pregnancy, if this applies. BEFORE THE PROCEDURE  No special preparation is needed. Eat and drink normally.  PROCEDURE   In order to produce an image of your heart, gel will be applied to your chest and a wand-like tool (transducer) will be moved over your chest. The gel will help transmit the sound waves from the transducer. The sound waves will harmlessly bounce off your heart to allow the heart images to be captured in real-time motion.  These images will then be recorded.  You may need an IV to receive a medicine that improves the quality of the pictures. AFTER THE PROCEDURE You may return to your normal schedule including diet, activities, and medicines, unless your health care provider tells you otherwise.   This information is not intended to replace advice given to you by your health care provider. Make sure you discuss any questions you have with your health care provider.   Document Released: 06/12/2000 Document Revised: 07/06/2014 Document Reviewed: 02/20/2013 Elsevier Interactive Patient Education [employer].    If you need a refill on your cardiac medications before your next appointment, please call your pharmacy.       Renea Ee  10/30/2015 2:54 PM    [facility] [address], [place], [place]  [address] Phone: [phone]; Fax: [phone]

## 2015-10-30 ENCOUNTER — Encounter: Payer: Self-pay | Admitting: Physician Assistant

## 2015-10-30 ENCOUNTER — Ambulatory Visit (INDEPENDENT_AMBULATORY_CARE_PROVIDER_SITE_OTHER): Payer: Federal, State, Local not specified - PPO | Admitting: Physician Assistant

## 2015-10-30 VITALS — BP 128/78 | HR 67 | Ht 60.0 in | Wt 141.0 lb

## 2015-10-30 DIAGNOSIS — I10 Essential (primary) hypertension: Secondary | ICD-10-CM | POA: Diagnosis not present

## 2015-10-30 DIAGNOSIS — E785 Hyperlipidemia, unspecified: Secondary | ICD-10-CM | POA: Diagnosis not present

## 2015-10-30 DIAGNOSIS — Z8673 Personal history of transient ischemic attack (TIA), and cerebral infarction without residual deficits: Secondary | ICD-10-CM

## 2015-10-30 DIAGNOSIS — R079 Chest pain, unspecified: Secondary | ICD-10-CM

## 2015-10-30 DIAGNOSIS — N189 Chronic kidney disease, unspecified: Secondary | ICD-10-CM

## 2015-10-30 DIAGNOSIS — R0602 Shortness of breath: Secondary | ICD-10-CM

## 2015-10-30 NOTE — Patient Instructions (Signed)
Medication Instructions:  Your physician recommends that you continue on your current medications as directed. Please refer to the Current Medication list given to you today.   Labwork: TODAY:  BNP & BMP  Testing/Procedures: Your physician has requested that you have an echocardiogram. Echocardiography is a painless test that uses sound waves to create images of your heart. It provides your doctor with information about the size and shape of your heart and how well your heart's chambers and valves are working. This procedure takes approximately one hour. There are no restrictions for this procedure.  Your physician has requested that you have a lexiscan myoview. For further information please visit [email or web address]. Please follow instruction sheet, as given.   Follow-Up: Your physician recommends that you schedule a follow-up appointment in: WILL BE BASED UPON YOUR TEST RESULTS   Any Other Special Instructions Will Be Listed Below (If Applicable). Pharmacologic Stress Electrocardiogram A pharmacologic stress electrocardiogram is a heart (cardiac) test that uses nuclear imaging to evaluate the blood supply to your heart. This test may also be called a pharmacologic stress electrocardiography. Pharmacologic means that a medicine is used to increase your heart rate and blood pressure.  This stress test is done to find areas of poor blood flow to the heart by determining the extent of coronary artery disease (CAD). Some people exercise on a treadmill, which naturally increases the blood flow to the heart. For those people unable to exercise on a treadmill, a medicine is used. This medicine stimulates your heart and will cause your heart to beat harder and more quickly, as if you were exercising.  Pharmacologic stress tests can help determine:  The adequacy of blood flow to your heart during increased levels of activity in order to clear you for discharge home.  The extent of coronary artery  blockage caused by CAD.  Your prognosis if you have suffered a heart attack.  The effectiveness of cardiac procedures done, such as an angioplasty, which can increase the circulation in your coronary arteries.  Causes of chest pain or pressure. LET [facility] CARE PROVIDER KNOW ABOUT:  Any allergies you have.  All medicines you are taking, including vitamins, herbs, eye drops, creams, and over-the-counter medicines.  Previous problems you or members of your family have had with the use of anesthetics.  Any blood disorders you have.  Previous surgeries you have had.  Medical conditions you have.  Possibility of pregnancy, if this applies.  If you are currently breastfeeding. RISKS AND COMPLICATIONS Generally, this is a safe procedure. However, as with any procedure, complications can occur. Possible complications include:  You develop pain or pressure in the following areas:  Chest.  Jaw or neck.  Between your shoulder blades.  Radiating down your left arm.  Headache.  Dizziness or light-headedness.  Shortness of breath.  Increased or irregular heartbeat.  Low blood pressure.  Nausea or vomiting.  Flushing.  Redness going up the arm and slight pain during injection of medicine.  Heart attack (rare). BEFORE THE PROCEDURE   Avoid all forms of caffeine for 24 hours before your test or as directed by your health care provider. This includes coffee, tea (even decaffeinated tea), caffeinated sodas, chocolate, cocoa, and certain pain medicines.  Follow your health care provider's instructions regarding eating and drinking before the test.  Take your medicines as directed at regular times with water unless instructed otherwise. Exceptions may include:  If you have diabetes, ask how you are to take your insulin or pills. It  is common to adjust insulin dosing the morning of the test.  If you are taking beta-blocker medicines, it is important to talk to your  health care provider about these medicines well before the date of your test. Taking beta-blocker medicines may interfere with the test. In some cases, these medicines need to be changed or stopped 24 hours or more before the test.  If you wear a nitroglycerin patch, it may need to be removed prior to the test. Ask your health care provider if the patch should be removed before the test.  If you use an inhaler for any breathing condition, bring it with you to the test.  If you are an outpatient, bring a snack so you can eat right after the stress phase of the test.  Do not smoke for 4 hours prior to the test or as directed by your health care provider.  Do not apply lotions, powders, creams, or oils on your chest prior to the test.  Wear comfortable shoes and clothing. Let your health care provider know if you were unable to complete or follow the preparations for your test. PROCEDURE   Multiple patches (electrodes) will be put on your chest. If needed, small areas of your chest may be shaved to get better contact with the electrodes. Once the electrodes are attached to your body, multiple wires will be attached to the electrodes, and your heart rate will be monitored.  An IV access will be started. A nuclear trace (isotope) is given. The isotope may be given intravenously, or it may be swallowed. Nuclear refers to several types of radioactive isotopes, and the nuclear isotope lights up the arteries so that the nuclear images are clear. The isotope is absorbed by your body. This results in low radiation exposure.  A resting nuclear image is taken to show how your heart functions at rest.  A medicine is given through the IV access.  A second scan is done about 1 hour after the medicine injection and determines how your heart functions under stress.  During this stress phase, you will be connected to an electrocardiogram machine. Your blood pressure and oxygen levels will be monitored. AFTER  THE PROCEDURE   Your heart rate and blood pressure will be monitored after the test.  You may return to your normal schedule, including diet,activities, and medicines, unless your health care provider tells you otherwise.   This information is not intended to replace advice given to you by your health care provider. Make sure you discuss any questions you have with your health care provider.   Document Released: 11/01/2008 Document Revised: 06/20/2013 Document Reviewed: 02/20/2013 Elsevier Interactive Patient Education 2016 [employer].   Echocardiogram An echocardiogram, or echocardiography, uses sound waves (ultrasound) to produce an image of your heart. The echocardiogram is simple, painless, obtained within a short period of time, and offers valuable information to your health care provider. The images from an echocardiogram can provide information such as:  Evidence of coronary artery disease (CAD).  Heart size.  Heart muscle function.  Heart valve function.  Aneurysm detection.  Evidence of a past heart attack.  Fluid buildup around the heart.  Heart muscle thickening.  Assess heart valve function. LET [facility] CARE PROVIDER KNOW ABOUT:  Any allergies you have.  All medicines you are taking, including vitamins, herbs, eye drops, creams, and over-the-counter medicines.  Previous problems you or members of your family have had with the use of anesthetics.  Any blood disorders you  have.  Previous surgeries you have had.  Medical conditions you have.  Possibility of pregnancy, if this applies. BEFORE THE PROCEDURE  No special preparation is needed. Eat and drink normally.  PROCEDURE   In order to produce an image of your heart, gel will be applied to your chest and a wand-like tool (transducer) will be moved over your chest. The gel will help transmit the sound waves from the transducer. The sound waves will harmlessly bounce off your heart to allow the  heart images to be captured in real-time motion. These images will then be recorded.  You may need an IV to receive a medicine that improves the quality of the pictures. AFTER THE PROCEDURE You may return to your normal schedule including diet, activities, and medicines, unless your health care provider tells you otherwise.   This information is not intended to replace advice given to you by your health care provider. Make sure you discuss any questions you have with your health care provider.   Document Released: 06/12/2000 Document Revised: 07/06/2014 Document Reviewed: 02/20/2013 Elsevier Interactive Patient Education [employer].    If you need a refill on your cardiac medications before your next appointment, please call your pharmacy.

## 2015-10-31 LAB — BASIC METABOLIC PANEL
BUN: 20 mg/dL (ref 7–25)
CO2: 27 mmol/L (ref 20–31)
Calcium: 9.8 mg/dL (ref 8.6–10.4)
Chloride: 101 mmol/L (ref 98–110)
Creat: 0.93 mg/dL — ABNORMAL HIGH (ref 0.60–0.88)
Glucose, Bld: 87 mg/dL (ref 65–99)
Potassium: 4.4 mmol/L (ref 3.5–5.3)
Sodium: 139 mmol/L (ref 135–146)

## 2015-10-31 LAB — BRAIN NATRIURETIC PEPTIDE: Brain Natriuretic Peptide: 38.2 pg/mL (ref <100)

## 2015-11-13 ENCOUNTER — Telehealth (HOSPITAL_COMMUNITY): Payer: Self-pay | Admitting: *Deleted

## 2015-11-13 NOTE — Telephone Encounter (Signed)
Left message on voicemail in reference to upcoming appointment scheduled for 11/18/15. Phone number given for a call back so details instructions can be given. Hubbard Robinson, RN

## 2015-11-15 ENCOUNTER — Other Ambulatory Visit: Payer: Self-pay

## 2015-11-15 ENCOUNTER — Ambulatory Visit (HOSPITAL_BASED_OUTPATIENT_CLINIC_OR_DEPARTMENT_OTHER): Payer: Federal, State, Local not specified - PPO

## 2015-11-15 ENCOUNTER — Ambulatory Visit (HOSPITAL_COMMUNITY): Payer: Federal, State, Local not specified - PPO | Attending: Cardiology

## 2015-11-15 DIAGNOSIS — R079 Chest pain, unspecified: Secondary | ICD-10-CM

## 2015-11-15 DIAGNOSIS — E785 Hyperlipidemia, unspecified: Secondary | ICD-10-CM | POA: Insufficient documentation

## 2015-11-15 DIAGNOSIS — Z86718 Personal history of other venous thrombosis and embolism: Secondary | ICD-10-CM | POA: Diagnosis not present

## 2015-11-15 DIAGNOSIS — I10 Essential (primary) hypertension: Secondary | ICD-10-CM | POA: Insufficient documentation

## 2015-11-15 DIAGNOSIS — Z853 Personal history of malignant neoplasm of breast: Secondary | ICD-10-CM | POA: Insufficient documentation

## 2015-11-15 DIAGNOSIS — Z8249 Family history of ischemic heart disease and other diseases of the circulatory system: Secondary | ICD-10-CM | POA: Insufficient documentation

## 2015-11-15 DIAGNOSIS — R0602 Shortness of breath: Secondary | ICD-10-CM | POA: Insufficient documentation

## 2015-11-15 DIAGNOSIS — I129 Hypertensive chronic kidney disease with stage 1 through stage 4 chronic kidney disease, or unspecified chronic kidney disease: Secondary | ICD-10-CM | POA: Insufficient documentation

## 2015-11-15 DIAGNOSIS — Z8673 Personal history of transient ischemic attack (TIA), and cerebral infarction without residual deficits: Secondary | ICD-10-CM | POA: Insufficient documentation

## 2015-11-15 DIAGNOSIS — N189 Chronic kidney disease, unspecified: Secondary | ICD-10-CM | POA: Diagnosis not present

## 2015-11-15 LAB — MYOCARDIAL PERFUSION IMAGING
LV dias vol: 59 mL (ref 46–106)
LV sys vol: 16 mL
Peak HR: 88 {beats}/min
RATE: 0.36
Rest HR: 58 {beats}/min
SDS: 0
SRS: 16
SSS: 16
TID: 1

## 2015-11-15 MED ORDER — TECHNETIUM TC 99M TETROFOSMIN IV KIT
32.6000 | PACK | Freq: Once | INTRAVENOUS | Status: AC | PRN
  Administered 2015-11-15: 33 via INTRAVENOUS
  Filled 2015-11-15: qty 33

## 2015-11-15 MED ORDER — TECHNETIUM TC 99M TETROFOSMIN IV KIT
10.4000 | PACK | Freq: Once | INTRAVENOUS | Status: AC | PRN
  Administered 2015-11-15: 10 via INTRAVENOUS
  Filled 2015-11-15: qty 10

## 2015-11-15 MED ORDER — REGADENOSON 0.4 MG/5ML IV SOLN
0.4000 mg | Freq: Once | INTRAVENOUS | Status: AC
  Administered 2015-11-15: 0.4 mg via INTRAVENOUS

## 2015-11-18 ENCOUNTER — Telehealth: Payer: Self-pay | Admitting: *Deleted

## 2015-11-18 DIAGNOSIS — R0789 Other chest pain: Secondary | ICD-10-CM

## 2015-11-18 NOTE — Telephone Encounter (Signed)
-----   Message from Eileen Stanford, PA-C sent at 11/18/2015 10:04 AM EDT ----- Yes. Stress echo please. Thanks jen!! I already discussed with the patient she is aware that you will call her

## 2015-11-18 NOTE — Telephone Encounter (Signed)
Spoke with pt and advised her that she will receive a call from our schedulers to get her stress echo up. Pt verbalized appreciation and understanding.

## 2015-12-04 ENCOUNTER — Other Ambulatory Visit: Payer: Self-pay | Admitting: Physician Assistant

## 2015-12-04 DIAGNOSIS — R079 Chest pain, unspecified: Secondary | ICD-10-CM

## 2015-12-10 ENCOUNTER — Telehealth: Payer: Self-pay | Admitting: Physician Assistant

## 2015-12-10 NOTE — Telephone Encounter (Signed)
Patient (per pt does not want do the treadmill/ she does not think she can pass. bs)

## 2015-12-10 NOTE — Telephone Encounter (Signed)
Nell Range, PA-C, called and spoke with pt.  Pt states that she has not had any more chest pain.  Katie and pt deiced to cancel the stress echo for now and if pt has anymore chest pain, she will call our office and we will at that time set up a stress echo with dobutamine.  Pt agreeable with this plan. Order for Stress Echo has been cancelled.

## 2015-12-16 ENCOUNTER — Other Ambulatory Visit (HOSPITAL_COMMUNITY): Payer: Federal, State, Local not specified - PPO

## 2016-01-14 DIAGNOSIS — M9902 Segmental and somatic dysfunction of thoracic region: Secondary | ICD-10-CM | POA: Diagnosis not present

## 2016-01-14 DIAGNOSIS — M9904 Segmental and somatic dysfunction of sacral region: Secondary | ICD-10-CM | POA: Diagnosis not present

## 2016-01-14 DIAGNOSIS — M2550 Pain in unspecified joint: Secondary | ICD-10-CM | POA: Diagnosis not present

## 2016-01-14 DIAGNOSIS — M5114 Intervertebral disc disorders with radiculopathy, thoracic region: Secondary | ICD-10-CM | POA: Diagnosis not present

## 2016-01-14 DIAGNOSIS — M5126 Other intervertebral disc displacement, lumbar region: Secondary | ICD-10-CM | POA: Diagnosis not present

## 2016-01-14 DIAGNOSIS — M9903 Segmental and somatic dysfunction of lumbar region: Secondary | ICD-10-CM | POA: Diagnosis not present

## 2016-01-15 DIAGNOSIS — M9904 Segmental and somatic dysfunction of sacral region: Secondary | ICD-10-CM | POA: Diagnosis not present

## 2016-01-15 DIAGNOSIS — M5126 Other intervertebral disc displacement, lumbar region: Secondary | ICD-10-CM | POA: Diagnosis not present

## 2016-01-15 DIAGNOSIS — M9902 Segmental and somatic dysfunction of thoracic region: Secondary | ICD-10-CM | POA: Diagnosis not present

## 2016-01-15 DIAGNOSIS — M2550 Pain in unspecified joint: Secondary | ICD-10-CM | POA: Diagnosis not present

## 2016-01-15 DIAGNOSIS — M5114 Intervertebral disc disorders with radiculopathy, thoracic region: Secondary | ICD-10-CM | POA: Diagnosis not present

## 2016-01-15 DIAGNOSIS — M9903 Segmental and somatic dysfunction of lumbar region: Secondary | ICD-10-CM | POA: Diagnosis not present

## 2016-01-16 DIAGNOSIS — M3501 Sicca syndrome with keratoconjunctivitis: Secondary | ICD-10-CM | POA: Diagnosis not present

## 2016-01-16 DIAGNOSIS — H04123 Dry eye syndrome of bilateral lacrimal glands: Secondary | ICD-10-CM | POA: Diagnosis not present

## 2016-01-20 DIAGNOSIS — M2550 Pain in unspecified joint: Secondary | ICD-10-CM | POA: Diagnosis not present

## 2016-01-20 DIAGNOSIS — M9903 Segmental and somatic dysfunction of lumbar region: Secondary | ICD-10-CM | POA: Diagnosis not present

## 2016-01-20 DIAGNOSIS — M5126 Other intervertebral disc displacement, lumbar region: Secondary | ICD-10-CM | POA: Diagnosis not present

## 2016-01-20 DIAGNOSIS — M9904 Segmental and somatic dysfunction of sacral region: Secondary | ICD-10-CM | POA: Diagnosis not present

## 2016-01-20 DIAGNOSIS — M9902 Segmental and somatic dysfunction of thoracic region: Secondary | ICD-10-CM | POA: Diagnosis not present

## 2016-01-20 DIAGNOSIS — M5114 Intervertebral disc disorders with radiculopathy, thoracic region: Secondary | ICD-10-CM | POA: Diagnosis not present

## 2016-01-27 DIAGNOSIS — M5126 Other intervertebral disc displacement, lumbar region: Secondary | ICD-10-CM | POA: Diagnosis not present

## 2016-01-27 DIAGNOSIS — M9904 Segmental and somatic dysfunction of sacral region: Secondary | ICD-10-CM | POA: Diagnosis not present

## 2016-01-27 DIAGNOSIS — M9903 Segmental and somatic dysfunction of lumbar region: Secondary | ICD-10-CM | POA: Diagnosis not present

## 2016-01-27 DIAGNOSIS — M9902 Segmental and somatic dysfunction of thoracic region: Secondary | ICD-10-CM | POA: Diagnosis not present

## 2016-01-27 DIAGNOSIS — M2550 Pain in unspecified joint: Secondary | ICD-10-CM | POA: Diagnosis not present

## 2016-01-27 DIAGNOSIS — M5114 Intervertebral disc disorders with radiculopathy, thoracic region: Secondary | ICD-10-CM | POA: Diagnosis not present

## 2016-01-29 DIAGNOSIS — M2550 Pain in unspecified joint: Secondary | ICD-10-CM | POA: Diagnosis not present

## 2016-01-29 DIAGNOSIS — M5126 Other intervertebral disc displacement, lumbar region: Secondary | ICD-10-CM | POA: Diagnosis not present

## 2016-01-29 DIAGNOSIS — M9903 Segmental and somatic dysfunction of lumbar region: Secondary | ICD-10-CM | POA: Diagnosis not present

## 2016-01-29 DIAGNOSIS — M9902 Segmental and somatic dysfunction of thoracic region: Secondary | ICD-10-CM | POA: Diagnosis not present

## 2016-01-29 DIAGNOSIS — M9904 Segmental and somatic dysfunction of sacral region: Secondary | ICD-10-CM | POA: Diagnosis not present

## 2016-01-29 DIAGNOSIS — M5114 Intervertebral disc disorders with radiculopathy, thoracic region: Secondary | ICD-10-CM | POA: Diagnosis not present

## 2016-02-03 DIAGNOSIS — M9904 Segmental and somatic dysfunction of sacral region: Secondary | ICD-10-CM | POA: Diagnosis not present

## 2016-02-03 DIAGNOSIS — M2550 Pain in unspecified joint: Secondary | ICD-10-CM | POA: Diagnosis not present

## 2016-02-03 DIAGNOSIS — M9903 Segmental and somatic dysfunction of lumbar region: Secondary | ICD-10-CM | POA: Diagnosis not present

## 2016-02-03 DIAGNOSIS — M5126 Other intervertebral disc displacement, lumbar region: Secondary | ICD-10-CM | POA: Diagnosis not present

## 2016-02-03 DIAGNOSIS — M9902 Segmental and somatic dysfunction of thoracic region: Secondary | ICD-10-CM | POA: Diagnosis not present

## 2016-02-03 DIAGNOSIS — M5114 Intervertebral disc disorders with radiculopathy, thoracic region: Secondary | ICD-10-CM | POA: Diagnosis not present

## 2016-02-05 DIAGNOSIS — M9903 Segmental and somatic dysfunction of lumbar region: Secondary | ICD-10-CM | POA: Diagnosis not present

## 2016-02-05 DIAGNOSIS — M9904 Segmental and somatic dysfunction of sacral region: Secondary | ICD-10-CM | POA: Diagnosis not present

## 2016-02-05 DIAGNOSIS — M5114 Intervertebral disc disorders with radiculopathy, thoracic region: Secondary | ICD-10-CM | POA: Diagnosis not present

## 2016-02-05 DIAGNOSIS — M5126 Other intervertebral disc displacement, lumbar region: Secondary | ICD-10-CM | POA: Diagnosis not present

## 2016-02-05 DIAGNOSIS — M9902 Segmental and somatic dysfunction of thoracic region: Secondary | ICD-10-CM | POA: Diagnosis not present

## 2016-02-05 DIAGNOSIS — M2550 Pain in unspecified joint: Secondary | ICD-10-CM | POA: Diagnosis not present

## 2016-02-11 DIAGNOSIS — M9904 Segmental and somatic dysfunction of sacral region: Secondary | ICD-10-CM | POA: Diagnosis not present

## 2016-02-11 DIAGNOSIS — M9902 Segmental and somatic dysfunction of thoracic region: Secondary | ICD-10-CM | POA: Diagnosis not present

## 2016-02-11 DIAGNOSIS — M5114 Intervertebral disc disorders with radiculopathy, thoracic region: Secondary | ICD-10-CM | POA: Diagnosis not present

## 2016-02-11 DIAGNOSIS — M9903 Segmental and somatic dysfunction of lumbar region: Secondary | ICD-10-CM | POA: Diagnosis not present

## 2016-02-11 DIAGNOSIS — M5126 Other intervertebral disc displacement, lumbar region: Secondary | ICD-10-CM | POA: Diagnosis not present

## 2016-02-11 DIAGNOSIS — M2550 Pain in unspecified joint: Secondary | ICD-10-CM | POA: Diagnosis not present

## 2016-02-18 DIAGNOSIS — M9902 Segmental and somatic dysfunction of thoracic region: Secondary | ICD-10-CM | POA: Diagnosis not present

## 2016-02-18 DIAGNOSIS — M5114 Intervertebral disc disorders with radiculopathy, thoracic region: Secondary | ICD-10-CM | POA: Diagnosis not present

## 2016-02-18 DIAGNOSIS — M9904 Segmental and somatic dysfunction of sacral region: Secondary | ICD-10-CM | POA: Diagnosis not present

## 2016-02-18 DIAGNOSIS — M2550 Pain in unspecified joint: Secondary | ICD-10-CM | POA: Diagnosis not present

## 2016-02-18 DIAGNOSIS — M5126 Other intervertebral disc displacement, lumbar region: Secondary | ICD-10-CM | POA: Diagnosis not present

## 2016-02-18 DIAGNOSIS — M9903 Segmental and somatic dysfunction of lumbar region: Secondary | ICD-10-CM | POA: Diagnosis not present

## 2016-02-25 ENCOUNTER — Encounter (HOSPITAL_COMMUNITY): Payer: Self-pay | Admitting: Emergency Medicine

## 2016-02-25 ENCOUNTER — Emergency Department (HOSPITAL_COMMUNITY)
Admission: EM | Admit: 2016-02-25 | Discharge: 2016-02-26 | Disposition: A | Discharge status: Home / Self Care | Payer: Medicare Other | Attending: Emergency Medicine | Admitting: Emergency Medicine

## 2016-02-25 ENCOUNTER — Emergency Department (HOSPITAL_COMMUNITY): Payer: Medicare Other

## 2016-02-25 DIAGNOSIS — Z7982 Long term (current) use of aspirin: Secondary | ICD-10-CM | POA: Insufficient documentation

## 2016-02-25 DIAGNOSIS — Z85828 Personal history of other malignant neoplasm of skin: Secondary | ICD-10-CM | POA: Diagnosis not present

## 2016-02-25 DIAGNOSIS — R51 Headache: Secondary | ICD-10-CM

## 2016-02-25 DIAGNOSIS — M9904 Segmental and somatic dysfunction of sacral region: Secondary | ICD-10-CM | POA: Diagnosis not present

## 2016-02-25 DIAGNOSIS — R519 Headache, unspecified: Secondary | ICD-10-CM

## 2016-02-25 DIAGNOSIS — M9902 Segmental and somatic dysfunction of thoracic region: Secondary | ICD-10-CM | POA: Diagnosis not present

## 2016-02-25 DIAGNOSIS — I10 Essential (primary) hypertension: Secondary | ICD-10-CM | POA: Diagnosis not present

## 2016-02-25 DIAGNOSIS — M2550 Pain in unspecified joint: Secondary | ICD-10-CM | POA: Diagnosis not present

## 2016-02-25 DIAGNOSIS — R112 Nausea with vomiting, unspecified: Secondary | ICD-10-CM | POA: Diagnosis not present

## 2016-02-25 DIAGNOSIS — M5126 Other intervertebral disc displacement, lumbar region: Secondary | ICD-10-CM | POA: Diagnosis not present

## 2016-02-25 DIAGNOSIS — M9903 Segmental and somatic dysfunction of lumbar region: Secondary | ICD-10-CM | POA: Diagnosis not present

## 2016-02-25 DIAGNOSIS — M5114 Intervertebral disc disorders with radiculopathy, thoracic region: Secondary | ICD-10-CM | POA: Diagnosis not present

## 2016-02-25 LAB — CBC WITH DIFFERENTIAL/PLATELET
Basophils Absolute: 0.1 10*3/uL (ref 0.0–0.1)
Basophils Relative: 1 %
Eosinophils Absolute: 0.4 10*3/uL (ref 0.0–0.7)
Eosinophils Relative: 5 %
HCT: 39 % (ref 36.0–46.0)
Hemoglobin: 14.2 g/dL (ref 12.0–15.0)
Lymphocytes Relative: 34 %
Lymphs Abs: 2.7 10*3/uL (ref 0.7–4.0)
MCH: 31.6 pg (ref 26.0–34.0)
MCHC: 36.4 g/dL — ABNORMAL HIGH (ref 30.0–36.0)
MCV: 86.9 fL (ref 78.0–100.0)
Monocytes Absolute: 0.8 10*3/uL (ref 0.1–1.0)
Monocytes Relative: 10 %
Neutro Abs: 4.1 10*3/uL (ref 1.7–7.7)
Neutrophils Relative %: 50 %
Platelets: 179 10*3/uL (ref 150–400)
RBC: 4.49 MIL/uL (ref 3.87–5.11)
RDW: 12.4 % (ref 11.5–15.5)
WBC: 8.1 10*3/uL (ref 4.0–10.5)

## 2016-02-25 LAB — COMPREHENSIVE METABOLIC PANEL
ALT: 25 U/L (ref 14–54)
AST: 27 U/L (ref 15–41)
Albumin: 4.7 g/dL (ref 3.5–5.0)
Alkaline Phosphatase: 62 U/L (ref 38–126)
Anion gap: 13 (ref 5–15)
BUN: 24 mg/dL — ABNORMAL HIGH (ref 6–20)
CO2: 22 mmol/L (ref 22–32)
Calcium: 9.8 mg/dL (ref 8.9–10.3)
Chloride: 93 mmol/L — ABNORMAL LOW (ref 101–111)
Creatinine, Ser: 0.84 mg/dL (ref 0.44–1.00)
GFR calc Af Amer: >60 mL/min (ref >60)
GFR calc non Af Amer: >60 mL/min (ref >60)
Glucose, Bld: 147 mg/dL — ABNORMAL HIGH (ref 65–99)
Potassium: 4.2 mmol/L (ref 3.5–5.1)
Sodium: 128 mmol/L — ABNORMAL LOW (ref 135–145)
Total Bilirubin: 0.8 mg/dL (ref 0.3–1.2)
Total Protein: 7.8 g/dL (ref 6.5–8.1)

## 2016-02-25 MED ORDER — SODIUM CHLORIDE 0.9 % IV BOLUS (SEPSIS)
500.0000 mL | Freq: Once | INTRAVENOUS | Status: AC
  Administered 2016-02-25: 500 mL via INTRAVENOUS

## 2016-02-25 MED ORDER — PROCHLORPERAZINE EDISYLATE 5 MG/ML IJ SOLN
10.0000 mg | Freq: Once | INTRAMUSCULAR | Status: AC
  Administered 2016-02-25: 10 mg via INTRAVENOUS
  Filled 2016-02-25: qty 2

## 2016-02-25 MED ORDER — DIPHENHYDRAMINE HCL 50 MG/ML IJ SOLN
25.0000 mg | Freq: Once | INTRAMUSCULAR | Status: AC
  Administered 2016-02-25: 25 mg via INTRAVENOUS
  Filled 2016-02-25: qty 1

## 2016-02-25 NOTE — ED Notes (Signed)
Patient transported to CT 

## 2016-02-25 NOTE — ED Provider Notes (Signed)
[facility] DEPT Provider Note   CSN: [id number] Arrival date & time: 02/25/16  2021     History   Chief Complaint No chief complaint on file.   HPI Shannon Serrano is a 82 y.o. female.  82 year old female with past medical history including hypertension, TIA, fibromyalgia who presents with nausea, vomiting, and headache. The patient states that she took a new herbal supplement for hair growth at around 12:00 today. Approximately 30 minutes later, she began having nausea and vomiting. She also had a gradual onset of headache that became severe and constant. No associated visual changes, extremity numbness, weakness, or problems ambulating. She states that she has had headaches like this previously with either high or low blood pressure. She noted that her blood pressure was elevated. She denies any associated chest pain. She states that taking a breath and makes her headache worse. Daughter states that she does have a history of headaches and migraines. She also states that the patient presented to an ER several years ago for persistent nausea and vomiting as well as headache and she had an extensive workup which was normal. Patient has chronic intermittent diarrhea. Patient also endorses lightheadedness and feeling like she is going to die. No abdominal pain or urinary symptoms.   The history is provided by the patient and a relative.    Past Medical History:  Diagnosis Date  . Clavicle fracture 04/2004  . 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 Diverticulosis   . DVT (deep venous thrombosis) ([place]) 11/2007   Left leg- on lovenox x 1 injection only   . Global amnesia 11/2006  . Hearing loss   . History of fibromyalgia   . History of hysterectomy    History of hysterectomy for fibroids She still has her ovaries  . Hyperlipidemia    History of hyperlipidemia  . Hypertension   . S/P radiation therapy 07/22/10 - 08/11/10   Whole Left Breast/42.56 Gy/16 Fradctions  .  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 Skin cancer L Lower Leg  . Skin cancer, basal cell    leg  . Stroke ([place]) 12/31/2007   mini stroke / has had several   . Syncope     Patient Active Problem List   Diagnosis Date Noted  . DCIS of upper outer quadrant of female breast 08/10/2014  . Jaw pain 07/12/2013  . DCIS of the breast 06/17/2012  . DCIS (ductal carcinoma in situ)   . Fasting hyperglycemia 05/18/2011  . Benign hypertensive heart disease without heart failure 12/05/2010  . Hypercholesterolemia 12/05/2010  . Ductal carcinoma in situ of breast 06/03/2010    Past Surgical History:  Procedure Laterality Date  . APPENDECTOMY  1948  . BREAST LUMPECTOMY Left 06/18/10    LEFT BREAST -HIGHG RADE DUCTAL CARCINOMA INSITU, ER+, PR+,  . DOPPLER ECHOCARDIOGRAPHY  05/20/2007   EF 55-60%  . eye surgery  08;09   excessive oil film removed from eyes  . INTRACAPSULAR CATARACT EXTRACTION Bilateral    She also had cataract surgery on the right eye  . LAPAROSCOPIC CHOLECYSTECTOMY  2001; 2004  . VAGINAL HYSTERECTOMY  1978   fibroids    OB History    Gravida Para Term Preterm AB Living   5 5 4 1   3    SAB TAB Ectopic Multiple Live Births           1      Obstetric Comments  Menarche age 52, Parity age 89, No HRT Hysterectomy 1978       Home Medications    Prior to Admission medications   Medication Sig Start Date End Date Taking? Authorizing Provider  aspirin 81 MG tablet Take 81 mg by mouth daily.      Historical Provider, MD  B Complex Vitamins (B COMPLEX PO) Take 1 Dose by mouth daily.     Historical Provider, MD  Calcium Carb-Cholecalciferol (CALCIUM 1000 + D PO) Take 1,200 tablets by mouth daily.      Historical Provider, MD  Cholecalciferol (VITAMIN D PO) Take 1 Dose by mouth daily.     Historical Provider, MD  ciprofloxacin (CIPRO) 500 MG tablet Take 500 mg by mouth 2 (two) times daily. Reported on 07/17/2015 02/23/11   Historical Provider, MD    co-enzyme Q-10 30 MG capsule Take 30 mg by mouth daily.     Historical Provider, MD  cyclobenzaprine (FLEXERIL) 10 MG tablet TAKE 1 TABLET BY MOUTH THREE TIMES DAILY AS NEEDED FOR MUSCLE SPASMS 03/07/13   Darlin Coco, MD  losartan (COZAAR) 50 MG tablet Take 50 mg by mouth daily. 10/25/15   Historical Provider, MD  metoprolol (LOPRESSOR) 50 MG tablet Take 25 mg by mouth 2 (two) times daily. Take 1/2 tablet by mouth twice daily    Historical Provider, MD  Multiple Vitamins-Minerals (MULTIVITAMIN PO) Take 1 tablet by mouth.     Historical Provider, MD  valACYclovir (VALTREX) 500 MG tablet Take 1 tablet (500 mg total) by mouth 2 (two) times daily. X 3 days only at onset of outbreak 10/15/15   Regina Eck, CNM  ZETIA 10 MG tablet TAKE 1 TABLET BY MOUTH DAILY 03/16/13   Darlin Coco, MD    Family History Family History  Problem Relation Age of Onset  . Heart disease Mother   . Hypertension Mother   . Diabetes Father   . Heart disease Father   . Pancreatic cancer Sister   . Brain cancer Brother   . Heart attack Sister   . Uterine cancer Sister   . Colon cancer Sister   . Pancreatic cancer Brother     Social History Social History  Substance Use Topics  . Smoking status: Never Smoker  . Smokeless tobacco: Never Used  . Alcohol use No     Allergies   Codeine; Eszopiclone; Morphine; Morphine and related; Oxycodone-acetaminophen; Penicillins; Percocet [oxycodone-acetaminophen]; Sulfa antibiotics; and Sulfamethoxazole   Review of Systems Review of Systems 10 Systems reviewed and are negative for acute change except as noted in the HPI.   Physical Exam Updated Vital Signs BP 175/75   Pulse 64   Temp 97.5 F (36.4 C) (Oral)   Resp 14   Ht 5' (1.524 m)   Wt 145 lb (65.8 kg)   LMP 06/29/1976   SpO2 100%   BMI 28.32 kg/m   Physical Exam  Constitutional: She is oriented to person, place, and time. She appears well-developed and well-nourished. No distress.  Awake,  alert, anxious and uncomfortable  HENT:  Head: Normocephalic and atraumatic.  Eyes: Conjunctivae and EOM are normal. Pupils are equal, round, and reactive to light.  Neck: Neck supple.  Cardiovascular: Normal rate, regular rhythm and normal heart sounds.   No murmur heard. Pulmonary/Chest: Effort normal and breath sounds normal. No respiratory distress.  Abdominal: Soft. Bowel sounds are normal. She exhibits no distension. There is no tenderness.  Musculoskeletal: She exhibits no edema.  Neurological: She is alert and oriented to  person, place, and time. She has normal reflexes. No cranial nerve deficit. She exhibits normal muscle tone.  Fluent speech, normal finger-to-nose testing, negative pronator drift 5/5 strength and normal sensation x all 4 extremities  Skin: Skin is warm and dry. No rash noted.  Psychiatric:  anxious  Nursing note and vitals reviewed.    ED Treatments / Results  Labs (all labs ordered are listed, but only abnormal results are displayed) Labs Reviewed  CBC WITH DIFFERENTIAL/PLATELET - Abnormal; Notable for the following:       Result Value   MCHC 36.4 (*)    All other components within normal limits  COMPREHENSIVE METABOLIC PANEL - Abnormal; Notable for the following:    Sodium 128 (*)    Chloride 93 (*)    Glucose, Bld 147 (*)    BUN 24 (*)    All other components within normal limits    EKG  EKG Interpretation  Date/Time:  Tuesday February 25 2016 21:26:17 EDT Ventricular Rate:  64 PR Interval:    QRS Duration: 100 QT Interval:  421 QTC Calculation: 435 R Axis:   5 Text Interpretation:  Sinus rhythm No significant change since last tracing Confirmed by Hesston Hitchens MD, Brodey Bonn [phone]) on 02/25/2016 10:41:55 PM       Radiology Ct Head Wo Contrast  Result Date: 02/25/2016 CLINICAL DATA:  Headache, nausea, vomiting and lightheadedness beginning at 1730 hours. Hypertensive. Recently began new hair regrowth supplement. EXAM: CT HEAD WITHOUT CONTRAST  TECHNIQUE: Contiguous axial images were obtained from the base of the skull through the vertex without intravenous contrast. COMPARISON:  CT HEAD at January 02, 2007 FINDINGS: BRAIN: The ventricles and sulci are normal for age. No intraparenchymal hemorrhage, mass effect nor midline shift. Patchy supratentorial white matter hypodensities less than expected for patient's age, though non-specific are most compatible with chronic small vessel ischemic disease. No acute large vascular territory infarcts. No abnormal extra-axial fluid collections. Basal cisterns are patent. VASCULAR: Mild calcific atherosclerosis of the carotid siphons. SKULL: No skull fracture. No significant scalp soft tissue swelling. SINUSES/ORBITS: The included ocular globes and orbital contents are non-suspicious.Minimal LEFT mastoid effusion. Included paranasal sinuses are well-aerated. Tiny RIGHT anterior ethmoid osteoma is unchanged. OTHER: None. IMPRESSION: Normal CT HEAD for age. Electronically Signed   By: Elon Alas M.D.   On: 02/25/2016 23:42    Procedures Procedures (including critical care time)  Medications Ordered in ED Medications  prochlorperazine (COMPAZINE) injection 10 mg (10 mg Intravenous Given 02/25/16 2314)  diphenhydrAMINE (BENADRYL) injection 25 mg (25 mg Intravenous Given 02/25/16 2314)  sodium chloride 0.9 % bolus 500 mL (500 mLs Intravenous New Bag/Given 02/25/16 2335)     Initial Impression / Assessment and Plan / ED Course  I have reviewed the triage vital signs and the nursing notes.  Pertinent labs & imaging results that were available during my care of the patient were reviewed by me and considered in my medical decision making (see chart for details).  Clinical Course   Pt p/w N/V That began shortly after taking a new herbal supplement as well as gradual onset of headache. She states she has had headaches like this previously. She was uncomfortable and anxious on exam. Initial vital signs showed  severe hypertension that her hypertension improved prior to any intervention. On my examination, BP in the [id number] systolic. Patient uncomfortable and anxious but normal neurologic exam. Basic lab work shows sodium 128, chloride 93, BUN 24, normal creatinine, normal CBC. EKG unchanged from previous and no  reports of chest pain. Obtain head CT given hypertension, headache and vomiting. Gave migraine medication including fluid bolus, Benadryl, and Compazine.  Head CT was negative for acute process. Reexamination, the patient was smiling, pleasant, and stated that she felt much better. Her blood pressure has been improved since arrival and given that she states she has had similar headaches previously without any neurologic symptoms on exam today, I feel she is safe for discharge home. I have discussed supportive care instructions and extensively reviewed return precautions including any neurologic symptoms. Patient and daughter voiced understanding and patient discharged in satisfactory condition.  Final Clinical Impressions(s) / ED Diagnoses   Final diagnoses:  None    New Prescriptions New Prescriptions   No medications on file     Sharlett Iles, MD 02/26/16 [phone]

## 2016-02-25 NOTE — ED Notes (Signed)
Patient states she feels like she is going to pass out and that she feels like she is dying. Rates her HA 10/10 at this time.

## 2016-02-25 NOTE — ED Triage Notes (Signed)
Patient states she started having headache, nausea, vomiting and lightheaded around 1730 this evening. Patient states she took her BP med at 1800. BP elevate at 199/85. Patient states she started taking an hair herbal supplement for hair growth and feels like it is causing her symptoms.

## 2016-02-26 DIAGNOSIS — I10 Essential (primary) hypertension: Secondary | ICD-10-CM | POA: Diagnosis not present

## 2016-02-28 DIAGNOSIS — N183 Chronic kidney disease, stage 3 (moderate): Secondary | ICD-10-CM | POA: Diagnosis not present

## 2016-02-28 DIAGNOSIS — I129 Hypertensive chronic kidney disease with stage 1 through stage 4 chronic kidney disease, or unspecified chronic kidney disease: Secondary | ICD-10-CM | POA: Diagnosis not present

## 2016-02-28 DIAGNOSIS — Z23 Encounter for immunization: Secondary | ICD-10-CM | POA: Diagnosis not present

## 2016-02-28 DIAGNOSIS — G4489 Other headache syndrome: Secondary | ICD-10-CM | POA: Diagnosis not present

## 2016-03-04 ENCOUNTER — Ambulatory Visit: Payer: Federal, State, Local not specified - PPO | Admitting: Certified Nurse Midwife

## 2016-03-19 DIAGNOSIS — N183 Chronic kidney disease, stage 3 (moderate): Secondary | ICD-10-CM | POA: Diagnosis not present

## 2016-03-19 DIAGNOSIS — M79604 Pain in right leg: Secondary | ICD-10-CM | POA: Diagnosis not present

## 2016-03-19 DIAGNOSIS — S86311A Strain of muscle(s) and tendon(s) of peroneal muscle group at lower leg level, right leg, initial encounter: Secondary | ICD-10-CM | POA: Diagnosis not present

## 2016-03-25 DIAGNOSIS — M9902 Segmental and somatic dysfunction of thoracic region: Secondary | ICD-10-CM | POA: Diagnosis not present

## 2016-03-25 DIAGNOSIS — M9904 Segmental and somatic dysfunction of sacral region: Secondary | ICD-10-CM | POA: Diagnosis not present

## 2016-03-25 DIAGNOSIS — M2550 Pain in unspecified joint: Secondary | ICD-10-CM | POA: Diagnosis not present

## 2016-03-25 DIAGNOSIS — M5126 Other intervertebral disc displacement, lumbar region: Secondary | ICD-10-CM | POA: Diagnosis not present

## 2016-03-25 DIAGNOSIS — M5114 Intervertebral disc disorders with radiculopathy, thoracic region: Secondary | ICD-10-CM | POA: Diagnosis not present

## 2016-03-25 DIAGNOSIS — M9903 Segmental and somatic dysfunction of lumbar region: Secondary | ICD-10-CM | POA: Diagnosis not present

## 2016-04-02 ENCOUNTER — Encounter: Payer: Self-pay | Admitting: Certified Nurse Midwife

## 2016-04-02 ENCOUNTER — Ambulatory Visit (INDEPENDENT_AMBULATORY_CARE_PROVIDER_SITE_OTHER): Payer: Medicare Other | Admitting: Certified Nurse Midwife

## 2016-04-02 DIAGNOSIS — Z01419 Encounter for gynecological examination (general) (routine) without abnormal findings: Secondary | ICD-10-CM

## 2016-04-02 DIAGNOSIS — Z124 Encounter for screening for malignant neoplasm of cervix: Secondary | ICD-10-CM | POA: Diagnosis not present

## 2016-04-02 NOTE — Patient Instructions (Signed)

## 2016-04-02 NOTE — Progress Notes (Signed)
82 y.o. [id number] Widowed  Caucasian Fe here for annual exam. Menopausal no HRT. Denies vaginal bleeding or vaginal dryness. No HSV outbreak this year. Has been given Cipro for UTI and has developed increased memory loss,tendons and other muscle issues.. Patient reported to FDA and now has warning regarding memory loss.  Sees Dr. Felipa Eth yearly for hypertension,cholesterol management and labs/aex.  Occasional incontinence with stool or bowel, but feels is related to Cipro use and is improving. No other health issues today.  Patient's last menstrual period was 06/29/1976.          Sexually active: No.  The current method of family planning is status post hysterectomy.    Exercising: Yes.    bike,walk Smoker:  no  Health Maintenance: Pap:  05-23-01 MMG:  07-04-15 category b density birads 2:neg Colonoscopy: 2013 neg f/u 67yrs BMD:   2017 no significant change or hips and spine TDaP:  Up to date with pcp Shingles: had done Pneumonia: had done Hep C and HIV: not done Labs: pcp Self breast exam: done occ   reports that she has never smoked. She has never used smokeless tobacco. She reports that she does not drink alcohol or use drugs.  Past Medical History:  Diagnosis Date  . Clavicle fracture 04/2004  . 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 Diverticulosis   . DVT (deep venous thrombosis) ([place]) 11/2007   Left leg- on lovenox x 1 injection only   . Global amnesia 11/2006  . Hearing loss   . History of fibromyalgia   . History of hysterectomy    History of hysterectomy for fibroids She still has her ovaries  . Hyperlipidemia    History of hyperlipidemia  . Hypertension   . Kidney disease   . S/P radiation therapy 07/22/10 - 08/11/10   Whole Left Breast/42.56 Gy/16 Fradctions  . 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 Skin cancer L Lower Leg  . Skin cancer, basal cell    leg  . Stroke ([place]) 12/31/2007   mini stroke / has had several   . Syncope      Past Surgical History:  Procedure Laterality Date  . APPENDECTOMY  1948  . BREAST LUMPECTOMY Left 06/18/10    LEFT BREAST -HIGHG RADE DUCTAL CARCINOMA INSITU, ER+, PR+,  . DOPPLER ECHOCARDIOGRAPHY  05/20/2007   EF 55-60%  . eye surgery  08;09   excessive oil film removed from eyes  . INTRACAPSULAR CATARACT EXTRACTION Bilateral    She also had cataract surgery on the right eye  . LAPAROSCOPIC CHOLECYSTECTOMY  2001; 2004  . VAGINAL HYSTERECTOMY  1978   fibroids    Current Outpatient Prescriptions  Medication Sig Dispense Refill  . aspirin 81 MG tablet Take 81 mg by mouth daily.      . B Complex Vitamins (B COMPLEX PO) Take 1 Dose by mouth daily.     . Calcium Carb-Cholecalciferol (CALCIUM 1000 + D PO) Take 1,200 tablets by mouth daily.      . Cholecalciferol (VITAMIN D PO) Take 1 Dose by mouth daily.     Marland Kitchen co-enzyme Q-10 30 MG capsule Take 30 mg by mouth daily.     . cyclobenzaprine (FLEXERIL) 10 MG tablet TAKE 1 TABLET BY MOUTH THREE TIMES DAILY AS NEEDED FOR MUSCLE SPASMS 30 tablet 0  . lisinopril (PRINIVIL,ZESTRIL) 20 MG tablet TAKE 1/2 T PO BID  2  . metoprolol (LOPRESSOR) 50 MG tablet Take  25 mg by mouth 2 (two) times daily. Take 1/2 tablet by mouth twice daily    . Multiple Vitamins-Minerals (MULTIVITAMIN PO) Take 1 tablet by mouth.     . RESTASIS 0.05 % ophthalmic emulsion INT 1 GTT INTO OU BID  4  . valACYclovir (VALTREX) 500 MG tablet Take 1 tablet (500 mg total) by mouth 2 (two) times daily. X 3 days only at onset of outbreak 30 tablet 0  . ZETIA 10 MG tablet TAKE 1 TABLET BY MOUTH DAILY 30 tablet 1   No current facility-administered medications for this visit.     Family History  Problem Relation Age of Onset  . Heart disease Mother   . Hypertension Mother   . Diabetes Father   . Heart disease Father   . Pancreatic cancer Sister   . Brain cancer Brother   . Heart attack Sister   . Uterine cancer Sister   . Colon cancer Sister   . Pancreatic cancer Brother      ROS:  Pertinent items are noted in HPI.  Otherwise, a comprehensive ROS was negative.  Exam:   BP 120/84   Pulse 70   Resp 16   Ht 5' 0.25" (1.53 m)   Wt 144 lb (65.3 kg)   LMP 06/29/1976   BMI 27.89 kg/m  Height: 5' 0.25" (153 cm) Ht Readings from Last 3 Encounters:  04/02/16 5' 0.25" (1.53 m)  02/25/16 5' (1.524 m)  10/30/15 5' (1.524 m)    General appearance: alert, cooperative and appears stated age Head: Normocephalic, without obvious abnormality, atraumatic Neck: no adenopathy, supple, symmetrical, trachea midline and thyroid normal to inspection and palpation Lungs: clear to auscultation bilaterally Breasts: normal appearance, no masses or tenderness, No nipple retraction or dimpling, No nipple discharge or bleeding, No axillary or supraclavicular adenopathy, scarring on left breast from DCIS surgery Heart: regular rate and rhythm Abdomen: soft, non-tender; no masses,  no organomegaly Extremities: extremities normal, atraumatic, no cyanosis or edema Skin: Skin color, texture, turgor normal. No rashes or lesions Lymph nodes: Cervical, supraclavicular, and axillary nodes normal. No abnormal inguinal nodes palpated Neurologic: Grossly normal   Pelvic: External genitalia:  no lesions              Urethra:  normal appearing urethra with no masses, tenderness or lesions              Bartholin's and Skene's: normal                 Vagina: normal appearing vagina with normal color and discharge, no lesions, fair support,              Cervix: absent              Pap taken: No. Bimanual Exam:  Uterus:  uterus absent              Adnexa: normal adnexa and no mass, fullness, tenderness               Rectovaginal: Confirms               Anus:  normal sphincter tone, no lesions, no stool noted around anus  Chaperone present: yes  A:  Well Woman with normal exam  Menopausal no HRT  History of DCIS in left breast  Hypertension and cholesterol and cipro side effect  management of by PCP  Stool incontinence sporadic with loose stools only, medication related per pateint  P:   Reviewed health and  wellness pertinent to exam  Discussed importance of need for evaluation if vaginal bleeding.  Continue routine mammograms and SBE and report any changes.  Continue follow up with PCP as indicated.  Patient will advise if continues to have issues with incontinence or will see GI.  Pap smear as above not taken   counseled on breast self exam, mammography screening, adequate intake of calcium and vitamin D, diet and exercise, Kegel's exercises  return annually or prn  An After Visit Summary was printed and given to the patient.

## 2016-04-05 NOTE — Progress Notes (Signed)
Encounter reviewed Ricky Gallery, MD   

## 2016-04-07 DIAGNOSIS — I129 Hypertensive chronic kidney disease with stage 1 through stage 4 chronic kidney disease, or unspecified chronic kidney disease: Secondary | ICD-10-CM | POA: Diagnosis not present

## 2016-04-07 DIAGNOSIS — E871 Hypo-osmolality and hyponatremia: Secondary | ICD-10-CM | POA: Diagnosis not present

## 2016-04-07 DIAGNOSIS — N183 Chronic kidney disease, stage 3 (moderate): Secondary | ICD-10-CM | POA: Diagnosis not present

## 2016-04-08 DIAGNOSIS — M9902 Segmental and somatic dysfunction of thoracic region: Secondary | ICD-10-CM | POA: Diagnosis not present

## 2016-04-08 DIAGNOSIS — M2550 Pain in unspecified joint: Secondary | ICD-10-CM | POA: Diagnosis not present

## 2016-04-08 DIAGNOSIS — M9903 Segmental and somatic dysfunction of lumbar region: Secondary | ICD-10-CM | POA: Diagnosis not present

## 2016-04-08 DIAGNOSIS — M9904 Segmental and somatic dysfunction of sacral region: Secondary | ICD-10-CM | POA: Diagnosis not present

## 2016-04-08 DIAGNOSIS — M5114 Intervertebral disc disorders with radiculopathy, thoracic region: Secondary | ICD-10-CM | POA: Diagnosis not present

## 2016-04-08 DIAGNOSIS — M5126 Other intervertebral disc displacement, lumbar region: Secondary | ICD-10-CM | POA: Diagnosis not present

## 2016-04-20 DIAGNOSIS — N3000 Acute cystitis without hematuria: Secondary | ICD-10-CM | POA: Diagnosis not present

## 2016-04-23 DIAGNOSIS — Z961 Presence of intraocular lens: Secondary | ICD-10-CM | POA: Diagnosis not present

## 2016-04-23 DIAGNOSIS — H40013 Open angle with borderline findings, low risk, bilateral: Secondary | ICD-10-CM | POA: Diagnosis not present

## 2016-04-23 DIAGNOSIS — M3501 Sicca syndrome with keratoconjunctivitis: Secondary | ICD-10-CM | POA: Diagnosis not present

## 2016-04-23 DIAGNOSIS — H04123 Dry eye syndrome of bilateral lacrimal glands: Secondary | ICD-10-CM | POA: Diagnosis not present

## 2016-05-12 DIAGNOSIS — L602 Onychogryphosis: Secondary | ICD-10-CM | POA: Diagnosis not present

## 2016-05-12 DIAGNOSIS — M2042 Other hammer toe(s) (acquired), left foot: Secondary | ICD-10-CM | POA: Diagnosis not present

## 2016-05-25 ENCOUNTER — Other Ambulatory Visit: Payer: Self-pay | Admitting: Radiation Oncology

## 2016-05-25 DIAGNOSIS — Z853 Personal history of malignant neoplasm of breast: Secondary | ICD-10-CM

## 2016-07-21 ENCOUNTER — Ambulatory Visit
Admission: RE | Admit: 2016-07-21 | Discharge: 2016-07-21 | Disposition: A | Discharge status: Home / Self Care | Payer: Federal, State, Local not specified - PPO | Source: Ambulatory Visit | Attending: Radiation Oncology | Admitting: Radiation Oncology

## 2016-07-21 DIAGNOSIS — R928 Other abnormal and inconclusive findings on diagnostic imaging of breast: Secondary | ICD-10-CM | POA: Diagnosis not present

## 2016-07-21 DIAGNOSIS — Z853 Personal history of malignant neoplasm of breast: Secondary | ICD-10-CM

## 2016-07-31 DIAGNOSIS — I129 Hypertensive chronic kidney disease with stage 1 through stage 4 chronic kidney disease, or unspecified chronic kidney disease: Secondary | ICD-10-CM | POA: Diagnosis not present

## 2016-07-31 DIAGNOSIS — R413 Other amnesia: Secondary | ICD-10-CM | POA: Diagnosis not present

## 2016-07-31 DIAGNOSIS — M79605 Pain in left leg: Secondary | ICD-10-CM | POA: Diagnosis not present

## 2016-07-31 DIAGNOSIS — N183 Chronic kidney disease, stage 3 (moderate): Secondary | ICD-10-CM | POA: Diagnosis not present

## 2016-08-14 ENCOUNTER — Encounter (HOSPITAL_COMMUNITY): Payer: Self-pay | Admitting: Emergency Medicine

## 2016-08-14 ENCOUNTER — Emergency Department (HOSPITAL_COMMUNITY): Payer: Medicare Other

## 2016-08-14 ENCOUNTER — Other Ambulatory Visit (HOSPITAL_COMMUNITY): Payer: Medicare Other

## 2016-08-14 ENCOUNTER — Encounter (HOSPITAL_COMMUNITY)
Admission: EM | Disposition: A | Discharge status: Home / Self Care | Payer: Self-pay | Source: Home / Self Care | Attending: Family Medicine

## 2016-08-14 ENCOUNTER — Observation Stay (HOSPITAL_COMMUNITY)
Admission: EM | Admit: 2016-08-14 | Discharge: 2016-08-16 | Disposition: A | Discharge status: Home / Self Care | Payer: Medicare Other | Attending: Internal Medicine | Admitting: Internal Medicine

## 2016-08-14 DIAGNOSIS — Z885 Allergy status to narcotic agent status: Secondary | ICD-10-CM | POA: Diagnosis not present

## 2016-08-14 DIAGNOSIS — Z8673 Personal history of transient ischemic attack (TIA), and cerebral infarction without residual deficits: Secondary | ICD-10-CM | POA: Diagnosis not present

## 2016-08-14 DIAGNOSIS — I200 Unstable angina: Secondary | ICD-10-CM | POA: Diagnosis not present

## 2016-08-14 DIAGNOSIS — Z833 Family history of diabetes mellitus: Secondary | ICD-10-CM | POA: Diagnosis not present

## 2016-08-14 DIAGNOSIS — N179 Acute kidney failure, unspecified: Secondary | ICD-10-CM | POA: Diagnosis not present

## 2016-08-14 DIAGNOSIS — I2584 Coronary atherosclerosis due to calcified coronary lesion: Secondary | ICD-10-CM | POA: Diagnosis not present

## 2016-08-14 DIAGNOSIS — Z8249 Family history of ischemic heart disease and other diseases of the circulatory system: Secondary | ICD-10-CM | POA: Diagnosis not present

## 2016-08-14 DIAGNOSIS — E7800 Pure hypercholesterolemia, unspecified: Secondary | ICD-10-CM | POA: Diagnosis not present

## 2016-08-14 DIAGNOSIS — Z853 Personal history of malignant neoplasm of breast: Secondary | ICD-10-CM | POA: Diagnosis not present

## 2016-08-14 DIAGNOSIS — I119 Hypertensive heart disease without heart failure: Secondary | ICD-10-CM | POA: Diagnosis not present

## 2016-08-14 DIAGNOSIS — R079 Chest pain, unspecified: Secondary | ICD-10-CM | POA: Diagnosis not present

## 2016-08-14 DIAGNOSIS — Z880 Allergy status to penicillin: Secondary | ICD-10-CM | POA: Insufficient documentation

## 2016-08-14 DIAGNOSIS — R6884 Jaw pain: Secondary | ICD-10-CM | POA: Diagnosis not present

## 2016-08-14 DIAGNOSIS — N189 Chronic kidney disease, unspecified: Secondary | ICD-10-CM | POA: Insufficient documentation

## 2016-08-14 DIAGNOSIS — M797 Fibromyalgia: Secondary | ICD-10-CM | POA: Diagnosis not present

## 2016-08-14 DIAGNOSIS — E785 Hyperlipidemia, unspecified: Secondary | ICD-10-CM | POA: Insufficient documentation

## 2016-08-14 DIAGNOSIS — I25110 Atherosclerotic heart disease of native coronary artery with unstable angina pectoris: Secondary | ICD-10-CM | POA: Diagnosis not present

## 2016-08-14 DIAGNOSIS — Z882 Allergy status to sulfonamides status: Secondary | ICD-10-CM | POA: Insufficient documentation

## 2016-08-14 DIAGNOSIS — Z923 Personal history of irradiation: Secondary | ICD-10-CM | POA: Insufficient documentation

## 2016-08-14 DIAGNOSIS — R197 Diarrhea, unspecified: Secondary | ICD-10-CM | POA: Insufficient documentation

## 2016-08-14 DIAGNOSIS — I2510 Atherosclerotic heart disease of native coronary artery without angina pectoris: Secondary | ICD-10-CM | POA: Insufficient documentation

## 2016-08-14 DIAGNOSIS — R0789 Other chest pain: Secondary | ICD-10-CM | POA: Diagnosis not present

## 2016-08-14 DIAGNOSIS — Z86718 Personal history of other venous thrombosis and embolism: Secondary | ICD-10-CM | POA: Diagnosis not present

## 2016-08-14 DIAGNOSIS — I1310 Hypertensive heart and chronic kidney disease without heart failure, with stage 1 through stage 4 chronic kidney disease, or unspecified chronic kidney disease: Secondary | ICD-10-CM | POA: Diagnosis not present

## 2016-08-14 DIAGNOSIS — Z7982 Long term (current) use of aspirin: Secondary | ICD-10-CM | POA: Insufficient documentation

## 2016-08-14 HISTORY — PX: LEFT HEART CATH AND CORONARY ANGIOGRAPHY: CATH118249

## 2016-08-14 LAB — BASIC METABOLIC PANEL
Anion gap: 10 (ref 5–15)
BUN: 21 mg/dL — ABNORMAL HIGH (ref 6–20)
CO2: 22 mmol/L (ref 22–32)
Calcium: 9.3 mg/dL (ref 8.9–10.3)
Chloride: 101 mmol/L (ref 101–111)
Creatinine, Ser: 1.16 mg/dL — ABNORMAL HIGH (ref 0.44–1.00)
GFR calc Af Amer: 48 mL/min — ABNORMAL LOW (ref >60)
GFR calc non Af Amer: 42 mL/min — ABNORMAL LOW (ref >60)
Glucose, Bld: 153 mg/dL — ABNORMAL HIGH (ref 65–99)
Potassium: 4.5 mmol/L (ref 3.5–5.1)
Sodium: 133 mmol/L — ABNORMAL LOW (ref 135–145)

## 2016-08-14 LAB — BRAIN NATRIURETIC PEPTIDE: B Natriuretic Peptide: 43.1 pg/mL (ref 0.0–100.0)

## 2016-08-14 LAB — CBC
HCT: 41.4 % (ref 36.0–46.0)
HCT: 39.2 % (ref 36.0–46.0)
Hemoglobin: 14 g/dL (ref 12.0–15.0)
Hemoglobin: 13.5 g/dL (ref 12.0–15.0)
MCH: 31 pg (ref 26.0–34.0)
MCH: 30.9 pg (ref 26.0–34.0)
MCHC: 33.8 g/dL (ref 30.0–36.0)
MCHC: 34.4 g/dL (ref 30.0–36.0)
MCV: 91.8 fL (ref 78.0–100.0)
MCV: 89.7 fL (ref 78.0–100.0)
Platelets: 178 10*3/uL (ref 150–400)
Platelets: 185 10*3/uL (ref 150–400)
RBC: 4.51 MIL/uL (ref 3.87–5.11)
RBC: 4.37 MIL/uL (ref 3.87–5.11)
RDW: 13.1 % (ref 11.5–15.5)
RDW: 13.4 % (ref 11.5–15.5)
WBC: 12.7 10*3/uL — ABNORMAL HIGH (ref 4.0–10.5)
WBC: 9.3 10*3/uL (ref 4.0–10.5)

## 2016-08-14 LAB — TROPONIN I
Troponin I: 0.05 ng/mL (ref <0.03)
Troponin I: 0.09 ng/mL (ref <0.03)

## 2016-08-14 LAB — CREATININE, SERUM
Creatinine, Ser: 0.95 mg/dL (ref 0.44–1.00)
GFR calc Af Amer: >60 mL/min (ref >60)
GFR calc non Af Amer: 53 mL/min — ABNORMAL LOW (ref >60)

## 2016-08-14 LAB — INFLUENZA PANEL BY PCR (TYPE A & B)
Influenza A By PCR: NEGATIVE
Influenza B By PCR: NEGATIVE

## 2016-08-14 LAB — I-STAT TROPONIN, ED: Troponin i, poc: 0 ng/mL (ref 0.00–0.08)

## 2016-08-14 SURGERY — LEFT HEART CATH AND CORONARY ANGIOGRAPHY

## 2016-08-14 MED ORDER — ALPRAZOLAM 0.25 MG PO TABS
0.2500 mg | ORAL_TABLET | Freq: Two times a day (BID) | ORAL | Status: DC | PRN
  Filled 2016-08-14: qty 1

## 2016-08-14 MED ORDER — HEPARIN (PORCINE) IN NACL 2-0.9 UNIT/ML-% IJ SOLN
INTRAMUSCULAR | Status: AC
  Filled 2016-08-14: qty 1000

## 2016-08-14 MED ORDER — HEPARIN (PORCINE) IN NACL 2-0.9 UNIT/ML-% IJ SOLN
INTRAMUSCULAR | Status: DC | PRN
  Administered 2016-08-14: 1000 mL

## 2016-08-14 MED ORDER — ASPIRIN 81 MG PO CHEW
81.0000 mg | CHEWABLE_TABLET | Freq: Every day | ORAL | Status: DC
  Administered 2016-08-15 – 2016-08-16 (×2): 81 mg via ORAL
  Filled 2016-08-14 (×2): qty 1

## 2016-08-14 MED ORDER — VALACYCLOVIR HCL 500 MG PO TABS: 500.0000 mg | ORAL_TABLET | Freq: Two times a day (BID) | ORAL | Status: DC

## 2016-08-14 MED ORDER — ASPIRIN EC 325 MG PO TBEC: 325.0000 mg | DELAYED_RELEASE_TABLET | Freq: Every day | ORAL | Status: DC

## 2016-08-14 MED ORDER — HEPARIN SODIUM (PORCINE) 1000 UNIT/ML IJ SOLN
INTRAMUSCULAR | Status: AC
  Filled 2016-08-14: qty 1

## 2016-08-14 MED ORDER — IOPAMIDOL (ISOVUE-370) INJECTION 76%
INTRAVENOUS | Status: AC
  Filled 2016-08-14: qty 100

## 2016-08-14 MED ORDER — HEPARIN SODIUM (PORCINE) 5000 UNIT/ML IJ SOLN
5000.0000 [IU] | Freq: Three times a day (TID) | INTRAMUSCULAR | Status: DC
  Administered 2016-08-15 – 2016-08-16 (×4): 5000 [IU] via SUBCUTANEOUS
  Filled 2016-08-14 (×4): qty 1

## 2016-08-14 MED ORDER — NITROGLYCERIN 0.4 MG SL SUBL: 0.4000 mg | SUBLINGUAL_TABLET | SUBLINGUAL | Status: DC | PRN

## 2016-08-14 MED ORDER — HYDRALAZINE HCL 20 MG/ML IJ SOLN: 10.0000 mg | Freq: Three times a day (TID) | INTRAMUSCULAR | Status: DC | PRN

## 2016-08-14 MED ORDER — HEPARIN SODIUM (PORCINE) 1000 UNIT/ML IJ SOLN
INTRAMUSCULAR | Status: DC | PRN
  Administered 2016-08-14: 3500 [IU] via INTRAVENOUS

## 2016-08-14 MED ORDER — SODIUM CHLORIDE 0.9 % IV SOLN: INTRAVENOUS | Status: AC

## 2016-08-14 MED ORDER — SODIUM CHLORIDE 0.9 % WEIGHT BASED INFUSION: 1.0000 mL/kg/h | INTRAVENOUS | Status: DC

## 2016-08-14 MED ORDER — EZETIMIBE 10 MG PO TABS
10.0000 mg | ORAL_TABLET | Freq: Every day | ORAL | Status: DC
  Administered 2016-08-14 – 2016-08-16 (×3): 10 mg via ORAL
  Filled 2016-08-14 (×3): qty 1

## 2016-08-14 MED ORDER — SODIUM CHLORIDE 0.9% FLUSH
3.0000 mL | Freq: Two times a day (BID) | INTRAVENOUS | Status: DC
  Administered 2016-08-14 – 2016-08-15 (×3): 3 mL via INTRAVENOUS

## 2016-08-14 MED ORDER — IOPAMIDOL (ISOVUE-370) INJECTION 76%
INTRAVENOUS | Status: DC | PRN
Start: 2016-08-14 — End: 2016-08-14
  Administered 2016-08-14: 30 mL via INTRA_ARTERIAL

## 2016-08-14 MED ORDER — POTASSIUM CHLORIDE IN NACL 20-0.9 MEQ/L-% IV SOLN
INTRAVENOUS | Status: DC
  Administered 2016-08-14: 13:00:00 via INTRAVENOUS
  Filled 2016-08-14: qty 1000

## 2016-08-14 MED ORDER — SODIUM CHLORIDE 0.9% FLUSH: 3.0000 mL | INTRAVENOUS | Status: DC | PRN

## 2016-08-14 MED ORDER — WHITE PETROLATUM GEL
Status: AC
  Administered 2016-08-14: 22:00:00
  Filled 2016-08-14: qty 1

## 2016-08-14 MED ORDER — ONDANSETRON HCL 4 MG/2ML IJ SOLN: 4.0000 mg | Freq: Four times a day (QID) | INTRAMUSCULAR | Status: DC | PRN

## 2016-08-14 MED ORDER — SODIUM CHLORIDE 0.9 % IV SOLN
INTRAVENOUS | Status: DC | PRN
  Administered 2016-08-14: 20 mL/h via INTRAVENOUS

## 2016-08-14 MED ORDER — LIDOCAINE HCL (PF) 1 % IJ SOLN
INTRAMUSCULAR | Status: AC
  Filled 2016-08-14: qty 30

## 2016-08-14 MED ORDER — LISINOPRIL 10 MG PO TABS: 10.0000 mg | ORAL_TABLET | Freq: Every day | ORAL | Status: DC

## 2016-08-14 MED ORDER — CYCLOSPORINE 0.05 % OP EMUL
1.0000 [drp] | Freq: Every day | OPHTHALMIC | Status: DC
  Administered 2016-08-14 – 2016-08-15 (×2): 1 [drp] via OPHTHALMIC
  Filled 2016-08-14 (×2): qty 1

## 2016-08-14 MED ORDER — SODIUM CHLORIDE 0.9 % IV SOLN: 250.0000 mL | INTRAVENOUS | Status: DC | PRN

## 2016-08-14 MED ORDER — SODIUM CHLORIDE 0.9 % WEIGHT BASED INFUSION: 3.0000 mL/kg/h | INTRAVENOUS | Status: DC

## 2016-08-14 MED ORDER — ACETAMINOPHEN 325 MG PO TABS
650.0000 mg | ORAL_TABLET | ORAL | Status: DC | PRN
  Administered 2016-08-14: 650 mg via ORAL
  Filled 2016-08-14: qty 2

## 2016-08-14 MED ORDER — VERAPAMIL HCL 2.5 MG/ML IV SOLN
INTRAVENOUS | Status: DC | PRN
  Administered 2016-08-14: 10 mL via INTRA_ARTERIAL

## 2016-08-14 MED ORDER — CARVEDILOL 3.125 MG PO TABS
3.1250 mg | ORAL_TABLET | Freq: Two times a day (BID) | ORAL | Status: DC
  Administered 2016-08-14 – 2016-08-15 (×2): 3.125 mg via ORAL
  Filled 2016-08-14 (×2): qty 1

## 2016-08-14 MED ORDER — VERAPAMIL HCL 2.5 MG/ML IV SOLN
INTRAVENOUS | Status: AC
  Filled 2016-08-14: qty 2

## 2016-08-14 SURGICAL SUPPLY — 11 items
CATH IMPULSE 5F ANG/FL3.5 (CATHETERS) ×1 IMPLANT
CATH INFINITI 5FR JL5 (CATHETERS) ×1 IMPLANT
DEVICE RAD COMP TR BAND LRG (VASCULAR PRODUCTS) ×1 IMPLANT
GLIDESHEATH SLEND SS 6F .021 (SHEATH) ×1 IMPLANT
GUIDEWIRE INQWIRE 1.5J.035X260 (WIRE) IMPLANT
INQWIRE 1.5J .035X260CM (WIRE) ×2
KIT HEART LEFT (KITS) ×2 IMPLANT
PACK CARDIAC CATHETERIZATION (CUSTOM PROCEDURE TRAY) ×2 IMPLANT
TRANSDUCER W/STOPCOCK (MISCELLANEOUS) ×2 IMPLANT
TUBING CIL FLEX 10 FLL-RA (TUBING) ×2 IMPLANT
WIRE HI TORQ VERSACORE-J 145CM (WIRE) ×1 IMPLANT

## 2016-08-14 NOTE — H&P (View-Only) (Signed)
CARDIOLOGY CONSULT NOTE   Patient ID: Shannon Serrano MRN: [id number] DOB/AGE: 10-03-1928 83 y.o.  Admit date: 08/14/2016  Requesting Physician: Dr. Aggie Moats Primary Physician:   Mathews Argyle, MD Primary Cardiologist: Dr. Mare Ferrari --> Dr. Martin[place]  Reason for Consultation:   Chest pain   HPI: Shannon Serrano is a 83 y.o. female with a history of  HTN, HLD, TIA, fibromyalgia, DVT (2009), born with one functioning kidney with CKD who presented to [facility] ED this AM for evaluation of chest pain.   She was seen by Dr. Mare Ferrari in 06/2013 for evaluation of jaw pain. He ordered a lexiscan myoview which was low risk for ischemia w/ EF 85%.    I saw her in clinic for evaluation of CP/SOB in 10/2015. Her chest pain was very atypical and better with exertion and worse with deep inspiration. BNP was normal. 2D echo showed EF 55-60%, G1 DD, mild AR. Lexiscan myvoview in 10/2015 showed probable prior distal anterior infarct with mild peri-infarct ischemia, inferoseptal thinning and mild ischemia and thinning of the basal inferior lateral wall; EF 72 with normal wall motion. I discussed the case with Dr. Burt Knack who suggested doing a stress echocardiogram for further evaluation. This was set up, but the patient did not have any more chest pain so she canceled the procedure.  She was doing well with no chest pain or shortness of breath until early this morning when she was walking in from outside and clipping a plan. She had sudden onset of severe chest pain that radiated into her neck and shoulder. It was associated with shortness of breath, diaphoresis and nausea and weakness. She couldn't think because she was in so much pain. She finally made her way to the phone and called 911. When EMS arrived they gave her aspirin as well as sublingual nitroglycerin which relieved her pain. She is completely chest pain-free currently. She continues to feel weak and just "doesn't feel right." She continues to ride  her stationary bike at home and has no problems with this.   Past Medical History:  Diagnosis Date  . Clavicle fracture 04/2004  . 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 Diverticulosis   . DVT (deep venous thrombosis) ([place]) 11/2007   Left leg- on lovenox x 1 injection only   . Global amnesia 11/2006  . Hearing loss   . History of fibromyalgia   . History of hysterectomy    History of hysterectomy for fibroids She still has her ovaries  . Hyperlipidemia    History of hyperlipidemia  . Hypertension   . Kidney disease   . S/P radiation therapy 07/22/10 - 08/11/10   Whole Left Breast/42.56 Gy/16 Fradctions  . 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 Skin cancer L Lower Leg  . Skin cancer, basal cell    leg  . Stroke ([place]) 12/31/2007   mini stroke / has had several   . Syncope      Past Surgical History:  Procedure Laterality Date  . APPENDECTOMY  1948  . BREAST LUMPECTOMY Left 06/18/10    LEFT BREAST -HIGHG RADE DUCTAL CARCINOMA INSITU, ER+, PR+,  . DOPPLER ECHOCARDIOGRAPHY  05/20/2007   EF 55-60%  . eye surgery  08;09   excessive oil film removed from eyes  . INTRACAPSULAR CATARACT EXTRACTION Bilateral    She also had cataract surgery on the right eye  . LAPAROSCOPIC CHOLECYSTECTOMY  2001; 2004  .  VAGINAL HYSTERECTOMY  1978   fibroids    Allergies  Allergen Reactions  . Ciprofloxacin     Unable to take due to repeated use for uti  . Codeine Other (See Comments)    Unknown   . Eszopiclone Other (See Comments)    Unknown   . Morphine Other (See Comments)    Unknown   . Morphine And Related Nausea And Vomiting  . Oxycodone-Acetaminophen Other (See Comments)    Unknown   . Penicillins Nausea And Vomiting       . Sulfa Antibiotics Hives    I have reviewed the patient's current medications . [START ON 08/15/2016] aspirin EC  325 mg Oral Daily  . heparin  5,000 Units Subcutaneous Q8H   . 0.9 % NaCl with KCl 20 mEq / L      acetaminophen, ALPRAZolam, hydrALAZINE, nitroGLYCERIN, ondansetron (ZOFRAN) IV  Prior to Admission medications   Medication Sig Start Date End Date Taking? Authorizing Provider  aspirin 81 MG tablet Take 81 mg by mouth daily.     Yes Historical Provider, MD  metoprolol (LOPRESSOR) 50 MG tablet Take 25 mg by mouth 2 (two) times daily. Take 1/2 tablet by mouth twice daily   Yes Historical Provider, MD  Multiple Vitamin (MULTIVITAMIN WITH MINERALS) TABS tablet Take 1 tablet by mouth daily.   Yes Historical Provider, MD  ZETIA 10 MG tablet TAKE 1 TABLET BY MOUTH DAILY 03/16/13  Yes Darlin Coco, MD  B Complex Vitamins (B COMPLEX PO) Take 1 Dose by mouth daily.     Historical Provider, MD  Calcium Carb-Cholecalciferol (CALCIUM 1000 + D PO) Take 1,200 tablets by mouth daily.      Historical Provider, MD  Cholecalciferol (VITAMIN D PO) Take 1 Dose by mouth daily.     Historical Provider, MD  co-enzyme Q-10 30 MG capsule Take 30 mg by mouth daily.     Historical Provider, MD  cyclobenzaprine (FLEXERIL) 10 MG tablet TAKE 1 TABLET BY MOUTH THREE TIMES DAILY AS NEEDED FOR MUSCLE SPASMS 03/07/13   Darlin Coco, MD  lisinopril (PRINIVIL,ZESTRIL) 20 MG tablet TAKE 1/2 T PO BID 03/26/16   Historical Provider, MD  RESTASIS 0.05 % ophthalmic emulsion INT 1 GTT INTO OU BID 01/31/16   Historical Provider, MD  valACYclovir (VALTREX) 500 MG tablet Take 1 tablet (500 mg total) by mouth 2 (two) times daily. X 3 days only at onset of outbreak 10/15/15   Regina Eck, CNM     Social History   Social History  . Marital status: Widowed    Spouse name: N/A  . Number of children: 4  . Years of education: N/A   Occupational History  . [employer]   Social History Main Topics  . Smoking status: Never Smoker  . Smokeless tobacco: Never Used  . Alcohol use No  . Drug use: No  . Sexual activity: No     Comment: hysterectomy   Other Topics Concern  . Not on file   Social History  Narrative  . No narrative on file    Family Status  Relation Status  . Mother Deceased at age 34  . Father Deceased at age 45  . Sister Deceased   COLON CAr  . Brother Deceased  . Sister Deceased  . Brother Deceased  . Sister Deceased  . Sister Deceased  . Sister Deceased  . Sister   . Brother    Family History  Problem Relation Age of Onset  .  Heart disease Mother   . Hypertension Mother   . Diabetes Father   . Heart disease Father   . Pancreatic cancer Sister   . Brain cancer Brother   . Heart attack Sister   . Uterine cancer Sister   . Colon cancer Sister   . Pancreatic cancer Brother    ROS:  Full 14 point review of systems complete and found to be negative unless listed above.  Physical Exam: Blood pressure 128/68, pulse 86, temperature 98.1 F (36.7 C), temperature source Oral, resp. rate 15, height 5' (1.524 m), weight 147 lb (66.7 kg), last menstrual period 06/29/1976, SpO2 100 %.  General: Well developed, well nourished, female in no acute distress. Appears younger than her stated age Head: Eyes PERRLA, No xanthomas.   Normocephalic and atraumatic, oropharynx without edema or exudate.  Lungs: CTAB Heart: HRRR S1 S2, no rub/gallop, Heart regular rate and rhythm with S1, S2  No murmur. pulses are 2+ extrem.   Neck: No carotid bruits. No lymphadenopathy. no JVD. Abdomen: Bowel sounds present, abdomen soft and non-tender without masses or hernias noted. Msk:  No spine or cva tenderness. No weakness, no joint deformities or effusions. Extremities: No clubbing or cyanosis. No LE edema.  Neuro: Alert and oriented X 3. No focal deficits noted. Psych:  Good affect, responds appropriately Skin: No rashes or lesions noted.  Labs:   Lab Results  Component Value Date   WBC 12.7 (H) 08/14/2016   HGB 14.0 08/14/2016   HCT 41.4 08/14/2016   MCV 91.8 08/14/2016   PLT 178 08/14/2016   No results for input(s): INR in the last 72 hours.  Recent Labs Lab 08/14/16 0944   NA 133*  K 4.5  CL 101  CO2 22  BUN 21*  CREATININE 1.16*  CALCIUM 9.3  GLUCOSE 153*   No results found for: MG No results for input(s): CKTOTAL, CKMB, TROPONINI in the last 72 hours.  Recent Labs  08/14/16 0959  TROPIPOC 0.00   No results found for: PROBNP Lab Results  Component Value Date   CHOL 175 09/17/2011   HDL 56.20 09/17/2011   LDLCALC 92 09/17/2011   TRIG 133.0 09/17/2011    No results found for: LIPASE, AMYLASE  Vitamin B-12  Date/Time Value Ref Range Status  01/01/2007 02:18 AM 1320 (H) 211 - 911 Final   Folate  Date/Time Value Ref Range Status  01/01/2007 02:18 AM   Final   >20.0 (NOTE)  Reference Ranges        Deficient:       0.4 - 3.4 ng/mL        Indeterminate:   3.4 - 5.4 ng/mL        Normal:              > 5.4 ng/mL    Echo: 11/15/2015 LV EF: 55% -   60% Study Conclusions - Left ventricle: The cavity size was normal. Systolic function was   normal. The estimated ejection fraction was in the range of 55%   to 60%. Wall motion was normal; there were no regional wall   motion abnormalities. Doppler parameters are consistent with   abnormal left ventricular relaxation (grade 1 diastolic   dysfunction). - Aortic valve: Trileaflet; mildly thickened, mildly calcified   leaflets. There was mild regurgitation. - Mitral valve: Calcified annulus.    Study Highlights    Nuclear stress EF: 72%.  There was no ST segment deviation noted during stress.  Defect 1: There is  a large defect of severe severity present in the apex location.  Defect 2: There is a medium defect of severe severity present in the mid inferoseptal location.  Defect 3: There is a small defect of moderate severity present in the basal inferolateral location.   Technically challenging study; probable prior distal anterior infarct with mild peri-infarct ischemia, inferoseptal thinning and mild ischemia and thinning of the basal inferior lateral wall; EF 72 with normal wall  motion.      ECG: NSR HR 79  Radiology:  Dg Chest 2 View  Result Date: 08/14/2016 CLINICAL DATA:  Chest pain and weakness beginning this morning. EXAM: CHEST  2 VIEW COMPARISON:  02/05/2015 FINDINGS: The cardiac silhouette is normal in size and configuration. No mediastinal or hilar masses. No evidence of adenopathy. Stable mild chronic bronchitic change in the medial right lower lobe. Lungs are otherwise clear. No pleural effusion. No pneumothorax. Skeletal structures are demineralized but intact. IMPRESSION: No active cardiopulmonary disease. Electronically Signed   By: [name] M.D.   On: 08/14/2016 10:15    ASSESSMENT AND PLAN:    Principal Problem:   Chest pain Active Problems:   Benign hypertensive heart disease without heart failure   Hypercholesterolemia   Shannon Serrano is a 83 y.o. female with a history of  HTN, HLD, TIA, fibromyalgia, DVT (2009), born with one functioning kidney with CKD who presented to [facility] ED this AM for evaluation of chest pain.   Chest pain: No objective signs of ischemia at this point. ECG with no acute ST or T-wave changes. Troponin point of care is negative. Continue to cycle enzymes. She had a stress test less than a year ago that was technically challenging but showed probable prior distal anterior infarct with mild peri-infarct ischemia as well as inferoseptal thinning and mild ischemia of the basal inferior lateral wall.  Plan will be to perform coronary angiography today.  I have reviewed the risks, indications, and alternatives to cardiac catheterization and possible angioplasty/stenting with the patient. Risks include but are not limited to bleeding, infection, vascular injury, stroke, myocardial infection, arrhythmia, kidney injury, radiation-related injury in the case of prolonged fluoroscopy use, emergency cardiac surgery, and death. The patient understands the risks of serious complication is low (123456).    HTN: well controlled on  losartan 50mg  daily and Lopressor 50mg  BID  HLD: continue zetia. She does not tolerate statins.   Hx of TIA: continue ASA   CKD: born with one functioning kidney. Creatinine 1.16 with a GFR of 42.   Signed: Angelena Form, PA-C 08/14/2016 12:32 PM  Pager [phone]  Co-Sign MD Patient seen and examined and history reviewed. Agree with above findings and plan. Very pleasant 83 yo WF with history of HTN and HLD presents to the ED today for evaluation of chest pain. She was at home today when she suddenly developed severe SSCP. This pain was so severe she had to go lie down. Pain was 10/10 and seemed to involve her entire chest from her epigastrium up her entire chest. It radiated into her neck, arms and back. Eventually called 911. Was given ASA and Sl Ntg by EMS and pain resolved. Still has some soreness. States this is the worst pain she has ever had. On exam she is a pleasant WF in NAD No JVD or bruits Lungs clear. CV RRR without gallop or murmur Abd soft and NT no masses Pulses 2+ and equal. No edema  CXR, Ecg, and labs unremarkable  Impression:  Severe chest pain of unclear etiology. Worst pain of her life. Potential concerns include ACS with possible spontaneous reperfusion versus aortic dissection. I think dissection less likely with good pulses, no mediastinal widening on CXR and resolution of pain. She has had prior myoview studies that were low risk. Given presentation I would favor definitive evaluation with coronary angiography. I discussed this evaluation with her at length. The procedure and risks were reviewed including but not limited to death, myocardial infarction, stroke, arrythmias, bleeding, transfusion, emergency surgery, dye allergy, or renal dysfunction. The patient voices understanding and is agreeable to proceed. Will initiate hydration but I think we can proceed when cath lab available.    Shannon Serrano, [place] 08/14/2016 1:32 PM

## 2016-08-14 NOTE — Progress Notes (Signed)
Pt arrived to unit from ED. Placed on monitor and sent directly to cath lab. Pt had an episode of diarrhea. A&Ox4, RA. Daughter and MD at bedside. Will continue to monitor pt closely. Leanne Chang, RN

## 2016-08-14 NOTE — Interval H&P Note (Signed)
History and Physical Interval Note:  08/14/2016 2:35 PM  Shannon Serrano  has presented today for cardiac catheterization, with the diagnosis of unstable angina. The various methods of treatment have been discussed with the patient and family. After consideration of risks, benefits and other options for treatment, the patient has consented to  Procedure(s): Left Heart Cath and Coronary Angiography (N/A) as a surgical intervention .  The patient's history has been reviewed, patient examined, no change in status, stable for surgery.  I have reviewed the patient's chart and labs.  Questions were answered to the patient's satisfaction.    Cath Lab Visit (complete for each Cath Lab visit)  Clinical Evaluation Leading to the Procedure:   ACS: Yes.  (unstable angina)  Non-ACS:  N/A  Mohammad Granade

## 2016-08-14 NOTE — ED Provider Notes (Signed)
[facility] DEPT Provider Note   CSN: [id number] Arrival date & time: 08/14/16  0930     History   Chief Complaint Chief Complaint  Patient presents with  . Chest Pain    HPI Shannon Serrano is a 83 y.o. female who  has a past medical history of Clavicle fracture (04/2004); DCIS (ductal carcinoma in situ) (2011); Diverticulosis; DVT (deep venous thrombosis) ([place]) (11/2007); Global amnesia (11/2006); Hearing loss; History of fibromyalgia; History of hysterectomy; Hyperlipidemia; Hypertension; Kidney disease; S/P radiation therapy (07/22/10 - 08/11/10); Single kidney; Skin cancer (L Lower Leg); Skin cancer, basal cell; Stroke ([place]) (12/31/2007); and Syncope. She had sudden onset epigastric pain that moved retrosternally while pruning in her garden. She had associated chest pressure "like an elephant on my chest", radiating to BL shoulders, diaphoresis, nausea. She felt weak all over and dizzy But she did not vomit. She could not think clearly and states that she had to coach herself to get to the phone. She states that the pain was so bad she thought she was going to die so prior to calling EMS she called a neighbor to reach her children and tell them she loved them in case she did not make it. Shehas never had anything like this before. She has no known hx of CAD. She has a history of HTN. Denies smoking, HLD, family hx of early MI. No obese. No hx of DM. En route to the hospital. Patient had a similar, but less intense episode of repeat chest pressure and pain lasting about 2 minutes. At that time she was found to be bradycardic into the 30s with hypotension with a systolic down into the 0000000 according to nursing notes. Patient denies any other symptoms. She is symptom free at this time. She denies history of DVTs or PEs. No cough, no hemoptysis or shortness of breath at this time.   HPI  Past Medical History:  Diagnosis Date  . Clavicle fracture 04/2004  . DCIS (ductal carcinoma in situ) 2011     left breast  . Diverticulosis   . DVT (deep venous thrombosis) ([place]) 11/2007   Left leg- on lovenox x 1 injection only   . Global amnesia 11/2006  . Hearing loss   . History of fibromyalgia   . History of hysterectomy    History of hysterectomy for fibroids She still has her ovaries  . Hyperlipidemia    History of hyperlipidemia  . Hypertension   . Kidney disease   . S/P radiation therapy 07/22/10 - 08/11/10   Whole Left Breast/42.56 Gy/16 Fradctions  . 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 Skin cancer L Lower Leg  . Skin cancer, basal cell    leg  . Stroke ([place]) 12/31/2007   mini stroke / has had several   . Syncope     Patient Active Problem List   Diagnosis Date Noted  . DCIS of upper outer quadrant of female breast 08/10/2014  . Jaw pain 07/12/2013  . DCIS of the breast 06/17/2012  . DCIS (ductal carcinoma in situ)   . Fasting hyperglycemia 05/18/2011  . Benign hypertensive heart disease without heart failure 12/05/2010  . Hypercholesterolemia 12/05/2010  . Ductal carcinoma in situ of breast 06/03/2010    Past Surgical History:  Procedure Laterality Date  . APPENDECTOMY  1948  . BREAST LUMPECTOMY Left 06/18/10    LEFT BREAST -HIGHG RADE DUCTAL CARCINOMA INSITU, ER+, PR+,  . DOPPLER ECHOCARDIOGRAPHY  05/20/2007  EF 55-60%  . eye surgery  08;09   excessive oil film removed from eyes  . INTRACAPSULAR CATARACT EXTRACTION Bilateral    She also had cataract surgery on the right eye  . LAPAROSCOPIC CHOLECYSTECTOMY  2001; 2004  . VAGINAL HYSTERECTOMY  1978   fibroids    OB History    Gravida Para Term Preterm AB Living   6 5 4 1 1 3    SAB TAB Ectopic Multiple Live Births   1       1      Obstetric Comments   Menarche age 23, Parity age 30, No HRT Hysterectomy 1978       Home Medications    Prior to Admission medications   Medication Sig Start Date End Date Taking? Authorizing Provider  aspirin 81 MG tablet Take 81 mg by mouth  daily.      Historical Provider, MD  B Complex Vitamins (B COMPLEX PO) Take 1 Dose by mouth daily.     Historical Provider, MD  Calcium Carb-Cholecalciferol (CALCIUM 1000 + D PO) Take 1,200 tablets by mouth daily.      Historical Provider, MD  Cholecalciferol (VITAMIN D PO) Take 1 Dose by mouth daily.     Historical Provider, MD  co-enzyme Q-10 30 MG capsule Take 30 mg by mouth daily.     Historical Provider, MD  cyclobenzaprine (FLEXERIL) 10 MG tablet TAKE 1 TABLET BY MOUTH THREE TIMES DAILY AS NEEDED FOR MUSCLE SPASMS 03/07/13   Darlin Coco, MD  lisinopril (PRINIVIL,ZESTRIL) 20 MG tablet TAKE 1/2 T PO BID 03/26/16   Historical Provider, MD  metoprolol (LOPRESSOR) 50 MG tablet Take 25 mg by mouth 2 (two) times daily. Take 1/2 tablet by mouth twice daily    Historical Provider, MD  Multiple Vitamins-Minerals (MULTIVITAMIN PO) Take 1 tablet by mouth.     Historical Provider, MD  RESTASIS 0.05 % ophthalmic emulsion INT 1 GTT INTO OU BID 01/31/16   Historical Provider, MD  valACYclovir (VALTREX) 500 MG tablet Take 1 tablet (500 mg total) by mouth 2 (two) times daily. X 3 days only at onset of outbreak 10/15/15   Regina Eck, CNM  ZETIA 10 MG tablet TAKE 1 TABLET BY MOUTH DAILY 03/16/13   Darlin Coco, MD    Family History Family History  Problem Relation Age of Onset  . Heart disease Mother   . Hypertension Mother   . Diabetes Father   . Heart disease Father   . Pancreatic cancer Sister   . Brain cancer Brother   . Heart attack Sister   . Uterine cancer Sister   . Colon cancer Sister   . Pancreatic cancer Brother     Social History Social History  Substance Use Topics  . Smoking status: Never Smoker  . Smokeless tobacco: Never Used  . Alcohol use No     Allergies   Ciprofloxacin; Codeine; Eszopiclone; Morphine; Morphine and related; Oxycodone-acetaminophen; Oxycodone-acetaminophen; Penicillins; Percocet [oxycodone-acetaminophen]; Sulfa antibiotics; and  Sulfamethoxazole   Review of Systems Review of Systems  Ten systems reviewed and are negative for acute change, except as noted in the HPI.   Physical Exam Updated Vital Signs BP 132/76   Pulse 70   Temp 98.1 F (36.7 C) (Oral)   Resp 13   Ht 5' (1.524 m)   Wt 66.7 kg   LMP 06/29/1976   SpO2 96%   BMI 28.71 kg/m   Physical Exam  Constitutional: She is oriented to person, place, and time. She  appears well-developed and well-nourished. No distress.  HENT:  Head: Normocephalic and atraumatic.  Eyes: Conjunctivae and EOM are normal. Pupils are equal, round, and reactive to light. No scleral icterus.  Neck: Normal range of motion.  Cardiovascular: Normal rate, regular rhythm and normal heart sounds.  Exam reveals no gallop and no friction rub.   No murmur heard. Pulmonary/Chest: Effort normal and breath sounds normal. No respiratory distress.  Abdominal: Soft. Bowel sounds are normal. She exhibits no distension and no mass. There is no tenderness. There is no guarding.  Neurological: She is alert and oriented to person, place, and time.  Skin: Skin is warm and dry. She is not diaphoretic.  Nursing note and vitals reviewed.    ED Treatments / Results  Labs (all labs ordered are listed, but only abnormal results are displayed) Labs Reviewed  CBC - Abnormal; Notable for the following:       Result Value   WBC 12.7 (*)    All other components within normal limits  BASIC METABOLIC PANEL  I-STAT TROPOININ, ED    EKG  EKG Interpretation None       Radiology No results found.  Procedures Procedures (including critical care time)  Medications Ordered in ED Medications - No data to display   Initial Impression / Assessment and Plan / ED Course  I have reviewed the triage vital signs and the nursing notes.  Pertinent labs & imaging results that were available during my care of the patient were reviewed by me and considered in my medical decision making (see chart  for details).     EKG reviewed and without signs of ischemia.  Troponin is negative. Her heart score is 5. Making her moderate risk for adverse cardiac event. Story is highly concerning for ACS versus symptomatic bradycardia and ischemia. I spoken with Dr. Aggie Moats will admit the patient for cardiac workup and troponin trends. Patient agrees with plan of care and is stable for admission at this time Final Clinical Impressions(s) / ED Diagnoses   Final diagnoses:  Chest pain, unspecified type    New Prescriptions New Prescriptions   No medications on file     Margarita Mail, PA-C 08/14/16 [place], DO 08/14/16 [phone]

## 2016-08-14 NOTE — ED Triage Notes (Signed)
Pt arrives from home via [facility] reporting episode of CP while working in the garden. Pt reports pain lasted 20-25 minutes, reports SOB, weakness, diaphoresis. EMS reports intiail HR 60-65, BP 160/80, reports pt had episode lasting approx 2 minutes with BP 90/50, HR 35-45.  Pt placed on pads but HR spontaneously improved to 60. EMS reports giving 200 mL NS, 324 ASA, and 1 NTG.  Pt denies pain at this time, reports weakness. Resp E/U.

## 2016-08-14 NOTE — ED Notes (Signed)
Admit MD at bedside Nuclear Med called will complete stress test tomorrow pt appears in no distress family at bedside

## 2016-08-14 NOTE — H&P (Signed)
[facility] History and Physical  Shannon Serrano [name] DOB: Feb 28, 1929 DOA: 08/14/2016  Referring physician:  PCP: Mathews Argyle, MD   Chief Complaint: "It felt like an elephant sitting on my chest."  HPI: Shannon Serrano is a 83 y.o. female  with past medical history significant for for myalgia, hypertension, chronic kidney disease, radiation for breast cancer, stroke who presents to emergency room with a chief complaint of chest pain that started this morning. Patient states she was outside pruning a tree in her son room around 7 AM when she had acute onset of chest pressure. She rates the pain as severe and stated she tried to see if will go away on its own. The pain did not go away on its own and patient began to develop other symptoms such as nausea, radiation of pain to the back, some lightheadedness and neck pain. Patient called EMS and the rescue squad was there within 5 minutes. They administered nitroglycerin and aspirin. Patient is currently chest pain-free.  ED course: In the emergency room and EKG was done did not show any acute ST changes. Chest x-ray was negative for acute cardio pulmonary findings. Patient had one negative troponin. Hospitalists were consulted for admission.  Review of Systems:  As per HPI otherwise 10 point review of systems negative.    Past Medical History:  Diagnosis Date  . Clavicle fracture 04/2004  . 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 Diverticulosis   . DVT (deep venous thrombosis) ([place]) 11/2007   Left leg- on lovenox x 1 injection only   . Global amnesia 11/2006  . Hearing loss   . History of fibromyalgia   . History of hysterectomy    History of hysterectomy for fibroids She still has her ovaries  . Hyperlipidemia    History of hyperlipidemia  . Hypertension   . Kidney disease   . S/P radiation therapy 07/22/10 - 08/11/10   Whole Left Breast/42.56 Gy/16 Fradctions  . Single kidney    RIGHT SIDE  WITH NONFUNCTIONING LEFT KIDNEY  . Skin cancer L Lower Leg  . Skin cancer, basal cell    leg  . Stroke ([place]) 12/31/2007   mini stroke / has had several   . Syncope    Past Surgical History:  Procedure Laterality Date  . APPENDECTOMY  1948  . BREAST LUMPECTOMY Left 06/18/10    LEFT BREAST -HIGHG RADE DUCTAL CARCINOMA INSITU, ER+, PR+,  . DOPPLER ECHOCARDIOGRAPHY  05/20/2007   EF 55-60%  . eye surgery  08;09   excessive oil film removed from eyes  . INTRACAPSULAR CATARACT EXTRACTION Bilateral    She also had cataract surgery on the right eye  . LAPAROSCOPIC CHOLECYSTECTOMY  2001; 2004  . VAGINAL HYSTERECTOMY  1978   fibroids   Social History:  reports that she has never smoked. She has never used smokeless tobacco. She reports that she does not drink alcohol or use drugs.  Allergies  Allergen Reactions  . Ciprofloxacin     Unable to take due to repeated use for uti  . Codeine Other (See Comments)    Unknown   . Eszopiclone Other (See Comments)    Unknown   . Morphine Other (See Comments)    Unknown   . Morphine And Related Nausea And Vomiting  . Oxycodone-Acetaminophen Other (See Comments)    Unknown   . Penicillins Nausea And Vomiting       . Sulfa Antibiotics Hives  Family History  Problem Relation Age of Onset  . Heart disease Mother   . Hypertension Mother   . Diabetes Father   . Heart disease Father   . Pancreatic cancer Sister   . Brain cancer Brother   . Heart attack Sister   . Uterine cancer Sister   . Colon cancer Sister   . Pancreatic cancer Brother      Prior to Admission medications   Medication Sig Start Date End Date Taking? Authorizing Provider  aspirin 81 MG tablet Take 81 mg by mouth daily.     Yes Historical Provider, MD  metoprolol (LOPRESSOR) 50 MG tablet Take 25 mg by mouth 2 (two) times daily. Take 1/2 tablet by mouth twice daily   Yes Historical Provider, MD  Multiple Vitamin (MULTIVITAMIN WITH MINERALS) TABS tablet Take 1 tablet by  mouth daily.   Yes Historical Provider, MD  ZETIA 10 MG tablet TAKE 1 TABLET BY MOUTH DAILY 03/16/13  Yes Darlin Coco, MD  B Complex Vitamins (B COMPLEX PO) Take 1 Dose by mouth daily.     Historical Provider, MD  Calcium Carb-Cholecalciferol (CALCIUM 1000 + D PO) Take 1,200 tablets by mouth daily.      Historical Provider, MD  Cholecalciferol (VITAMIN D PO) Take 1 Dose by mouth daily.     Historical Provider, MD  co-enzyme Q-10 30 MG capsule Take 30 mg by mouth daily.     Historical Provider, MD  cyclobenzaprine (FLEXERIL) 10 MG tablet TAKE 1 TABLET BY MOUTH THREE TIMES DAILY AS NEEDED FOR MUSCLE SPASMS 03/07/13   Darlin Coco, MD  lisinopril (PRINIVIL,ZESTRIL) 20 MG tablet TAKE 1/2 T PO BID 03/26/16   Historical Provider, MD  RESTASIS 0.05 % ophthalmic emulsion INT 1 GTT INTO OU BID 01/31/16   Historical Provider, MD  valACYclovir (VALTREX) 500 MG tablet Take 1 tablet (500 mg total) by mouth 2 (two) times daily. X 3 days only at onset of outbreak 10/15/15   Regina Eck, CNM   Physical Exam: Vitals:   08/14/16 0945 08/14/16 1000 08/14/16 1045 08/14/16 1115  BP: 132/76 133/66 128/82 128/68  Pulse: 70 77 86 86  Resp: 13 15 16 15   Temp:      TempSrc:      SpO2: 96% 97% 99% 100%  Weight: 66.7 kg (147 lb)     Height: 5' (1.524 m)       Wt Readings from Last 3 Encounters:  08/14/16 66.7 kg (147 lb)  04/02/16 65.3 kg (144 lb)  02/25/16 65.8 kg (145 lb)    General:  Appears calm and comfortable; alert and oriented 3 Eyes:  PERRL, EOMI, normal lids, iris ENT:  grossly normal hearing, lips & tongue Neck:  no LAD, masses or thyromegaly Cardiovascular:  RRR, no m/r/g. No LE edema.  Respiratory:  CTA bilaterally, no w/r/r. Normal respiratory effort. Abdomen:  soft, ntnd Skin:  no rash or induration seen on limited exam Musculoskeletal:  grossly normal tone BUE/BLE Psychiatric:  grossly normal mood and affect, speech fluent and appropriate Neurologic:  CN 2-12 grossly intact,  moves all extremities in coordinated fashion.          Labs on Admission:  Basic Metabolic Panel:  Recent Labs Lab 08/14/16 0944  NA 133*  K 4.5  CL 101  CO2 22  GLUCOSE 153*  BUN 21*  CREATININE 1.16*  CALCIUM 9.3   Liver Function Tests: No results for input(s): AST, ALT, ALKPHOS, BILITOT, PROT, ALBUMIN in the last 168 hours.  No results for input(s): LIPASE, AMYLASE in the last 168 hours. No results for input(s): AMMONIA in the last 168 hours. CBC:  Recent Labs Lab 08/14/16 0944  WBC 12.7*  HGB 14.0  HCT 41.4  MCV 91.8  PLT 178   Cardiac Enzymes: No results for input(s): CKTOTAL, CKMB, CKMBINDEX, TROPONINI in the last 168 hours.  BNP (last 3 results)  Recent Labs  10/30/15 1455  BNP 38.2    ProBNP (last 3 results) No results for input(s): PROBNP in the last 8760 hours.   Serum creatinine: 1.16 mg/dL High 08/14/16 0944 Estimated creatinine clearance: 30.2 mL/min  CBG: No results for input(s): GLUCAP in the last 168 hours.  Radiological Exams on Admission: Dg Chest 2 View  Result Date: 08/14/2016 CLINICAL DATA:  Chest pain and weakness beginning this morning. EXAM: CHEST  2 VIEW COMPARISON:  02/05/2015 FINDINGS: The cardiac silhouette is normal in size and configuration. No mediastinal or hilar masses. No evidence of adenopathy. Stable mild chronic bronchitic change in the medial right lower lobe. Lungs are otherwise clear. No pleural effusion. No pneumothorax. Skeletal structures are demineralized but intact. IMPRESSION: No active cardiopulmonary disease. Electronically Signed   By: [name] M.D.   On: 08/14/2016 10:15    EKG: Independently reviewed. No STEMI in no acute ST changes when compared to EKG from August 2017  Assessment/Plan Principal Problem:   Chest pain Active Problems:   Benign hypertensive heart disease without heart failure   Hypercholesterolemia   CP/bradycardia with low BP Given hear score of 5 in ED. - serial trop  ordered, initial neg - prn EKG CP - prn moprhine CP - prn ntg cp - asa 325 prior to ED and QD - NM stress test ordered for AM - tele bed, cardiac monitoring - ativan for anxiety prn - zofran prn for nausea - cardiology consult called - holding lopressor given episode bradycardia Well's Score for PE is 1.5 - low risk  Hypertension When necessary hydralazine 10 mg IV as needed for severe blood pressure Continue lisinopril  CKD Will monitor, baseline Cr about 1.0  HLD Cont zetia  FM Tylenol  Ocular disorder Cont restasis  Code Status: FULL CODE DVT Prophylaxis: heparin Family Communication: dgtr at bedside Disposition Plan: Pending Improvement  Status: obs tele  Elwin Mocha, MD Family Medicine [facility] [email or web address] Password [email or web address]

## 2016-08-14 NOTE — Consult Note (Signed)
CARDIOLOGY CONSULT NOTE   Patient ID: Shannon Serrano MRN: [id number] DOB/AGE: September 04, 1928 83 y.o.  Admit date: 08/14/2016  Requesting Physician: Dr. Aggie Moats Primary Physician:   Mathews Argyle, MD Primary Cardiologist: Dr. Mare Ferrari --> Dr. Martinique  Reason for Consultation:   Chest pain   HPI: Shannon Serrano is a 83 y.o. female with a history of  HTN, HLD, TIA, fibromyalgia, DVT (2009), born with one functioning kidney with CKD who presented to [facility] ED this AM for evaluation of chest pain.   She was seen by Dr. Mare Ferrari in 06/2013 for evaluation of jaw pain. He ordered a lexiscan myoview which was low risk for ischemia w/ EF 85%.    I saw her in clinic for evaluation of CP/SOB in 10/2015. Her chest pain was very atypical and better with exertion and worse with deep inspiration. BNP was normal. 2D echo showed EF 55-60%, G1 DD, mild AR. Lexiscan myvoview in 10/2015 showed probable prior distal anterior infarct with mild peri-infarct ischemia, inferoseptal thinning and mild ischemia and thinning of the basal inferior lateral wall; EF 72 with normal wall motion. I discussed the case with Dr. Burt Knack who suggested doing a stress echocardiogram for further evaluation. This was set up, but the patient did not have any more chest pain so she canceled the procedure.  She was doing well with no chest pain or shortness of breath until early this morning when she was walking in from outside and clipping a plan. She had sudden onset of severe chest pain that radiated into her neck and shoulder. It was associated with shortness of breath, diaphoresis and nausea and weakness. She couldn't think because she was in so much pain. She finally made her way to the phone and called 911. When EMS arrived they gave her aspirin as well as sublingual nitroglycerin which relieved her pain. She is completely chest pain-free currently. She continues to feel weak and just "doesn't feel right." She continues to ride  her stationary bike at home and has no problems with this.   Past Medical History:  Diagnosis Date  . Clavicle fracture 04/2004  . 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 Diverticulosis   . DVT (deep venous thrombosis) ([place]) 11/2007   Left leg- on lovenox x 1 injection only   . Global amnesia 11/2006  . Hearing loss   . History of fibromyalgia   . History of hysterectomy    History of hysterectomy for fibroids She still has her ovaries  . Hyperlipidemia    History of hyperlipidemia  . Hypertension   . Kidney disease   . S/P radiation therapy 07/22/10 - 08/11/10   Whole Left Breast/42.56 Gy/16 Fradctions  . 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 Skin cancer L Lower Leg  . Skin cancer, basal cell    leg  . Stroke ([address]) 12/31/2007   mini stroke / has had several   . Syncope      Past Surgical History:  Procedure Laterality Date  . APPENDECTOMY  1948  . BREAST LUMPECTOMY Left 06/18/10    LEFT BREAST -HIGHG RADE DUCTAL CARCINOMA INSITU, ER+, PR+,  . DOPPLER ECHOCARDIOGRAPHY  05/20/2007   EF 55-60%  . eye surgery  08;09   excessive oil film removed from eyes  . INTRACAPSULAR CATARACT EXTRACTION Bilateral    She also had cataract surgery on the right eye  . LAPAROSCOPIC CHOLECYSTECTOMY  2001; 2004  .  VAGINAL HYSTERECTOMY  1978   fibroids    Allergies  Allergen Reactions  . Ciprofloxacin     Unable to take due to repeated use for uti  . Codeine Other (See Comments)    Unknown   . Eszopiclone Other (See Comments)    Unknown   . Morphine Other (See Comments)    Unknown   . Morphine And Related Nausea And Vomiting  . Oxycodone-Acetaminophen Other (See Comments)    Unknown   . Penicillins Nausea And Vomiting       . Sulfa Antibiotics Hives    I have reviewed the patient's current medications . [START ON 08/15/2016] aspirin EC  325 mg Oral Daily  . heparin  5,000 Units Subcutaneous Q8H   . 0.9 % NaCl with KCl 20 mEq / L      acetaminophen, ALPRAZolam, hydrALAZINE, nitroGLYCERIN, ondansetron (ZOFRAN) IV  Prior to Admission medications   Medication Sig Start Date End Date Taking? Authorizing Provider  aspirin 81 MG tablet Take 81 mg by mouth daily.     Yes Historical Provider, MD  metoprolol (LOPRESSOR) 50 MG tablet Take 25 mg by mouth 2 (two) times daily. Take 1/2 tablet by mouth twice daily   Yes Historical Provider, MD  Multiple Vitamin (MULTIVITAMIN WITH MINERALS) TABS tablet Take 1 tablet by mouth daily.   Yes Historical Provider, MD  ZETIA 10 MG tablet TAKE 1 TABLET BY MOUTH DAILY 03/16/13  Yes Darlin Coco, MD  B Complex Vitamins (B COMPLEX PO) Take 1 Dose by mouth daily.     Historical Provider, MD  Calcium Carb-Cholecalciferol (CALCIUM 1000 + D PO) Take 1,200 tablets by mouth daily.      Historical Provider, MD  Cholecalciferol (VITAMIN D PO) Take 1 Dose by mouth daily.     Historical Provider, MD  co-enzyme Q-10 30 MG capsule Take 30 mg by mouth daily.     Historical Provider, MD  cyclobenzaprine (FLEXERIL) 10 MG tablet TAKE 1 TABLET BY MOUTH THREE TIMES DAILY AS NEEDED FOR MUSCLE SPASMS 03/07/13   Darlin Coco, MD  lisinopril (PRINIVIL,ZESTRIL) 20 MG tablet TAKE 1/2 T PO BID 03/26/16   Historical Provider, MD  RESTASIS 0.05 % ophthalmic emulsion INT 1 GTT INTO OU BID 01/31/16   Historical Provider, MD  valACYclovir (VALTREX) 500 MG tablet Take 1 tablet (500 mg total) by mouth 2 (two) times daily. X 3 days only at onset of outbreak 10/15/15   Regina Eck, CNM     Social History   Social History  . Marital status: Widowed    Spouse name: N/A  . Number of children: 4  . Years of education: N/A   Occupational History  . [employer]   Social History Main Topics  . Smoking status: Never Smoker  . Smokeless tobacco: Never Used  . Alcohol use No  . Drug use: No  . Sexual activity: No     Comment: hysterectomy   Other Topics Concern  . Not on file   Social History  Narrative  . No narrative on file    Family Status  Relation Status  . Mother Deceased at age 73  . Father Deceased at age 65  . Sister Deceased   COLON CAr  . Brother Deceased  . Sister Deceased  . Brother Deceased  . Sister Deceased  . Sister Deceased  . Sister Deceased  . Sister   . Brother    Family History  Problem Relation Age of Onset  .  Heart disease Mother   . Hypertension Mother   . Diabetes Father   . Heart disease Father   . Pancreatic cancer Sister   . Brain cancer Brother   . Heart attack Sister   . Uterine cancer Sister   . Colon cancer Sister   . Pancreatic cancer Brother    ROS:  Full 14 point review of systems complete and found to be negative unless listed above.  Physical Exam: Blood pressure 128/68, pulse 86, temperature 98.1 F (36.7 C), temperature source Oral, resp. rate 15, height 5' (1.524 m), weight 147 lb (66.7 kg), last menstrual period 06/29/1976, SpO2 100 %.  General: Well developed, well nourished, female in no acute distress. Appears younger than her stated age Head: Eyes PERRLA, No xanthomas.   Normocephalic and atraumatic, oropharynx without edema or exudate.  Lungs: CTAB Heart: HRRR S1 S2, no rub/gallop, Heart regular rate and rhythm with S1, S2  No murmur. pulses are 2+ extrem.   Neck: No carotid bruits. No lymphadenopathy. no JVD. Abdomen: Bowel sounds present, abdomen soft and non-tender without masses or hernias noted. Msk:  No spine or cva tenderness. No weakness, no joint deformities or effusions. Extremities: No clubbing or cyanosis. No LE edema.  Neuro: Alert and oriented X 3. No focal deficits noted. Psych:  Good affect, responds appropriately Skin: No rashes or lesions noted.  Labs:   Lab Results  Component Value Date   WBC 12.7 (H) 08/14/2016   HGB 14.0 08/14/2016   HCT 41.4 08/14/2016   MCV 91.8 08/14/2016   PLT 178 08/14/2016   No results for input(s): INR in the last 72 hours.  Recent Labs Lab 08/14/16 0944   NA 133*  K 4.5  CL 101  CO2 22  BUN 21*  CREATININE 1.16*  CALCIUM 9.3  GLUCOSE 153*   No results found for: MG No results for input(s): CKTOTAL, CKMB, TROPONINI in the last 72 hours.  Recent Labs  08/14/16 0959  TROPIPOC 0.00   No results found for: PROBNP Lab Results  Component Value Date   CHOL 175 09/17/2011   HDL 56.20 09/17/2011   LDLCALC 92 09/17/2011   TRIG 133.0 09/17/2011    No results found for: LIPASE, AMYLASE  Vitamin B-12  Date/Time Value Ref Range Status  01/01/2007 02:18 AM 1320 (H) 211 - 911 Final   Folate  Date/Time Value Ref Range Status  01/01/2007 02:18 AM   Final   >20.0 (NOTE)  Reference Ranges        Deficient:       0.4 - 3.4 ng/mL        Indeterminate:   3.4 - 5.4 ng/mL        Normal:              > 5.4 ng/mL    Echo: 11/15/2015 LV EF: 55% -   60% Study Conclusions - Left ventricle: The cavity size was normal. Systolic function was   normal. The estimated ejection fraction was in the range of 55%   to 60%. Wall motion was normal; there were no regional wall   motion abnormalities. Doppler parameters are consistent with   abnormal left ventricular relaxation (grade 1 diastolic   dysfunction). - Aortic valve: Trileaflet; mildly thickened, mildly calcified   leaflets. There was mild regurgitation. - Mitral valve: Calcified annulus.    Study Highlights    Nuclear stress EF: 72%.  There was no ST segment deviation noted during stress.  Defect 1: There is  a large defect of severe severity present in the apex location.  Defect 2: There is a medium defect of severe severity present in the mid inferoseptal location.  Defect 3: There is a small defect of moderate severity present in the basal inferolateral location.   Technically challenging study; probable prior distal anterior infarct with mild peri-infarct ischemia, inferoseptal thinning and mild ischemia and thinning of the basal inferior lateral wall; EF 72 with normal wall  motion.      ECG: NSR HR 79  Radiology:  Dg Chest 2 View  Result Date: 08/14/2016 CLINICAL DATA:  Chest pain and weakness beginning this morning. EXAM: CHEST  2 VIEW COMPARISON:  02/05/2015 FINDINGS: The cardiac silhouette is normal in size and configuration. No mediastinal or hilar masses. No evidence of adenopathy. Stable mild chronic bronchitic change in the medial right lower lobe. Lungs are otherwise clear. No pleural effusion. No pneumothorax. Skeletal structures are demineralized but intact. IMPRESSION: No active cardiopulmonary disease. Electronically Signed   By: [name] M.D.   On: 08/14/2016 10:15    ASSESSMENT AND PLAN:    Principal Problem:   Chest pain Active Problems:   Benign hypertensive heart disease without heart failure   Hypercholesterolemia   Shannon Serrano is a 83 y.o. female with a history of  HTN, HLD, TIA, fibromyalgia, DVT (2009), born with one functioning kidney with CKD who presented to [facility] ED this AM for evaluation of chest pain.   Chest pain: No objective signs of ischemia at this point. ECG with no acute ST or T-wave changes. Troponin point of care is negative. Continue to cycle enzymes. She had a stress test less than a year ago that was technically challenging but showed probable prior distal anterior infarct with mild peri-infarct ischemia as well as inferoseptal thinning and mild ischemia of the basal inferior lateral wall.  Plan will be to perform coronary angiography today.  I have reviewed the risks, indications, and alternatives to cardiac catheterization and possible angioplasty/stenting with the patient. Risks include but are not limited to bleeding, infection, vascular injury, stroke, myocardial infection, arrhythmia, kidney injury, radiation-related injury in the case of prolonged fluoroscopy use, emergency cardiac surgery, and death. The patient understands the risks of serious complication is low (123456).    HTN: well controlled on  losartan 50mg  daily and Lopressor 50mg  BID  HLD: continue zetia. She does not tolerate statins.   Hx of TIA: continue ASA   CKD: born with one functioning kidney. Creatinine 1.16 with a GFR of 42.   Signed: Angelena Form, PA-C 08/14/2016 12:32 PM  Pager [phone]  Co-Sign MD Patient seen and examined and history reviewed. Agree with above findings and plan. Very pleasant 83 yo WF with history of HTN and HLD presents to the ED today for evaluation of chest pain. She was at home today when she suddenly developed severe SSCP. This pain was so severe she had to go lie down. Pain was 10/10 and seemed to involve her entire chest from her epigastrium up her entire chest. It radiated into her neck, arms and back. Eventually called 911. Was given ASA and Sl Ntg by EMS and pain resolved. Still has some soreness. States this is the worst pain she has ever had. On exam she is a pleasant WF in NAD No JVD or bruits Lungs clear. CV RRR without gallop or murmur Abd soft and NT no masses Pulses 2+ and equal. No edema  CXR, Ecg, and labs unremarkable  Impression:  Severe chest pain of unclear etiology. Worst pain of her life. Potential concerns include ACS with possible spontaneous reperfusion versus aortic dissection. I think dissection less likely with good pulses, no mediastinal widening on CXR and resolution of pain. She has had prior myoview studies that were low risk. Given presentation I would favor definitive evaluation with coronary angiography. I discussed this evaluation with her at length. The procedure and risks were reviewed including but not limited to death, myocardial infarction, stroke, arrythmias, bleeding, transfusion, emergency surgery, dye allergy, or renal dysfunction. The patient voices understanding and is agreeable to proceed. Will initiate hydration but I think we can proceed when cath lab available.    Loree Shehata [place], [place] 08/14/2016 1:32 PM

## 2016-08-15 ENCOUNTER — Observation Stay (HOSPITAL_BASED_OUTPATIENT_CLINIC_OR_DEPARTMENT_OTHER): Payer: Medicare Other

## 2016-08-15 DIAGNOSIS — I2584 Coronary atherosclerosis due to calcified coronary lesion: Secondary | ICD-10-CM | POA: Diagnosis not present

## 2016-08-15 DIAGNOSIS — R0789 Other chest pain: Secondary | ICD-10-CM | POA: Diagnosis not present

## 2016-08-15 DIAGNOSIS — N189 Chronic kidney disease, unspecified: Secondary | ICD-10-CM | POA: Diagnosis not present

## 2016-08-15 DIAGNOSIS — E7800 Pure hypercholesterolemia, unspecified: Secondary | ICD-10-CM | POA: Diagnosis not present

## 2016-08-15 DIAGNOSIS — M797 Fibromyalgia: Secondary | ICD-10-CM | POA: Diagnosis not present

## 2016-08-15 DIAGNOSIS — I119 Hypertensive heart disease without heart failure: Secondary | ICD-10-CM | POA: Diagnosis not present

## 2016-08-15 DIAGNOSIS — I1310 Hypertensive heart and chronic kidney disease without heart failure, with stage 1 through stage 4 chronic kidney disease, or unspecified chronic kidney disease: Secondary | ICD-10-CM | POA: Diagnosis not present

## 2016-08-15 DIAGNOSIS — R197 Diarrhea, unspecified: Secondary | ICD-10-CM | POA: Diagnosis not present

## 2016-08-15 DIAGNOSIS — R079 Chest pain, unspecified: Secondary | ICD-10-CM | POA: Diagnosis not present

## 2016-08-15 DIAGNOSIS — Z8673 Personal history of transient ischemic attack (TIA), and cerebral infarction without residual deficits: Secondary | ICD-10-CM | POA: Diagnosis not present

## 2016-08-15 DIAGNOSIS — N179 Acute kidney failure, unspecified: Secondary | ICD-10-CM | POA: Diagnosis not present

## 2016-08-15 DIAGNOSIS — R6884 Jaw pain: Secondary | ICD-10-CM | POA: Diagnosis not present

## 2016-08-15 DIAGNOSIS — E785 Hyperlipidemia, unspecified: Secondary | ICD-10-CM | POA: Diagnosis not present

## 2016-08-15 DIAGNOSIS — I2510 Atherosclerotic heart disease of native coronary artery without angina pectoris: Secondary | ICD-10-CM | POA: Diagnosis not present

## 2016-08-15 LAB — LIPID PANEL
Cholesterol: 149 mg/dL (ref 0–200)
HDL: 44 mg/dL (ref >40)
LDL Cholesterol: 88 mg/dL (ref 0–99)
Total CHOL/HDL Ratio: 3.4 RATIO
Triglycerides: 83 mg/dL (ref <150)
VLDL: 17 mg/dL (ref 0–40)

## 2016-08-15 LAB — ECHOCARDIOGRAM COMPLETE
Height: 60 in
Weight: 2372.8 oz

## 2016-08-15 LAB — TROPONIN I: Troponin I: 0.07 ng/mL (ref <0.03)

## 2016-08-15 MED ORDER — CARVEDILOL 6.25 MG PO TABS
6.2500 mg | ORAL_TABLET | Freq: Two times a day (BID) | ORAL | Status: DC
  Administered 2016-08-15 – 2016-08-16 (×2): 6.25 mg via ORAL
  Filled 2016-08-15 (×2): qty 1

## 2016-08-15 NOTE — Progress Notes (Signed)
  Echocardiogram 2D Echocardiogram has been performed.  Darlina Sicilian M 08/15/2016, 2:09 PM

## 2016-08-15 NOTE — Progress Notes (Signed)
    Subjective:  Denies SSCP, palpitations or Dyspnea   Objective:  Vitals:   08/14/16 2344 08/15/16 0510 08/15/16 0739 08/15/16 0829  BP: 120/67 136/63 128/78 128/78  Pulse: 99 97  (!) 119  Resp: (!) 21 19    Temp:  98.6 F (37 C) 97.9 F (36.6 C)   TempSrc:  Oral Oral   SpO2: 94% 94%    Weight:      Height:        Intake/Output from previous day:  Intake/Output Summary (Last 24 hours) at 08/15/16 [id number] Last data filed at 08/15/16 0500  Gross per 24 hour  Intake              240 ml  Output              700 ml  Net             -460 ml    Physical Exam: Affect appropriate Healthy:  appears stated age HEENT: normal Neck supple with no adenopathy JVP normal no bruits no thyromegaly Lungs clear with no wheezing and good diaphragmatic motion Heart:  S1/S2 no murmur, no rub, gallop or click PMI normal Abdomen: benighn, BS positve, no tenderness, no AAA no bruit.  No HSM or HJR Distal pulses intact with no bruits No edema Neuro non-focal Skin warm and dry No muscular weakness Right radial A   Lab Results: Basic Metabolic Panel:  Recent Labs  08/14/16 0944 08/14/16 1612  NA 133*  --   K 4.5  --   CL 101  --   CO2 22  --   GLUCOSE 153*  --   BUN 21*  --   CREATININE 1.16* 0.95  CALCIUM 9.3  --    CBC:  Recent Labs  08/14/16 0944 08/14/16 1828  WBC 12.7* 9.3  HGB 14.0 13.5  HCT 41.4 39.2  MCV 91.8 89.7  PLT 178 185   Cardiac Enzymes:  Recent Labs  08/14/16 1612 08/14/16 1828 08/15/16 0040  TROPONINI 0.05* 0.09* 0.07*   Fasting Lipid Panel:  Recent Labs  08/15/16 0040  CHOL 149  HDL 44  LDLCALC 88  TRIG 83  CHOLHDL 3.4   Imaging: Dg Chest 2 View  Result Date: 08/14/2016 CLINICAL DATA:  Chest pain and weakness beginning this morning. EXAM: CHEST  2 VIEW COMPARISON:  02/05/2015 FINDINGS: The cardiac silhouette is normal in size and configuration. No mediastinal or hilar masses. No evidence of adenopathy. Stable mild chronic  bronchitic change in the medial right lower lobe. Lungs are otherwise clear. No pleural effusion. No pneumothorax. Skeletal structures are demineralized but intact. IMPRESSION: No active cardiopulmonary disease. Electronically Signed   By: [name] M.D.   On: 08/14/2016 10:15    Cardiac Studies:  ECG: SR  Normal    Telemetry:  NSR   Echo: pending   Medications:   . aspirin  81 mg Oral Daily  . carvedilol  3.125 mg Oral BID WC  . cycloSPORINE  1 drop Both Eyes QHS  . ezetimibe  10 mg Oral Daily  . heparin  5,000 Units Subcutaneous Q8H  . sodium chloride flush  3 mL Intravenous Q12H      Assessment/Plan:   Chest Pain:  Cath yesterday with moderate non obstructive LAD disease continue ASA will increase Beta blocker waiting on echo further w/u per primary service    Jenkins Rouge 08/15/2016, 9:23 AM

## 2016-08-15 NOTE — Care Management Obs Status (Signed)
[place] NOTIFICATION   Patient Details  Name: Shannon Serrano MRN: [id number] Date of Birth: [birth year]   Medicare Observation Status Notification Given:  Yes    CrutchfieldAntony Haste, RN 08/15/2016, 4:39 PM

## 2016-08-15 NOTE — Progress Notes (Signed)
Patient ID: Shannon Serrano, female   DOB: [birth year], 83 y.o.   MRN: [id number]  PROGRESS NOTE    Shannon Serrano  [name] DOB: Feb 26, 1929 DOA: 08/14/2016  PCP: Mathews Argyle, MD   Brief Narrative:   83 y.o. female  with past medical history significant for for myalgia, hypertension, chronic kidney disease, radiation for breast cancer, stroke who presented to ED with chest pain started on the morning of the admission. SHe was outside pruning a tree in her sun room around 7 AM when she had acute onset of chest pressure. She rated the pain as severe, 10/10 in intensity that did not go away with rest.  Patient called EMS and the rescue squad was there within 5 minutes. They administered nitroglycerin and aspirin which has helped with pain relief.  In the emergency room EKG showed no acute ST changes. Chest x-ray was negative for acute cardiopulmonary findings. Patient had one negative troponin. Seen by cardio and underwent cath 2/16.  Assessment & Plan:   Principal Problem:   Chest pain - Trop 0.05 --> 0.09 --> 0.07 - Cardiac cath 2/16 showed moderate non obstructive LAD disease - Continue ASA and carvedilol  - ECHO is pending  Active Problems:   Benign hypertension - Continue carvedilol    Hypercholesterolemia - Continue zetia     Acute renal failure - Likely prerenal - Repeat Cr WNL   DVT prophylaxis: Heparin subQ Code Status: full code  Family Communication: no family at the bedside  Disposition Plan: home in am, felt little dizzy and weak when she got up so will observe for next 24 hours    Consultants:   Cardio  Procedures:   Cardiac cath  Antimicrobials:   None    Subjective: Felt little dizzy when got up.  Objective: Vitals:   08/14/16 2344 08/15/16 0510 08/15/16 0739 08/15/16 0829  BP: 120/67 136/63 128/78 128/78  Pulse: 99 97  (!) 119  Resp: (!) 21 19    Temp:  98.6 F (37 C) 97.9 F (36.6 C)   TempSrc:  Oral Oral   SpO2:  94% 94%    Weight:      Height:        Intake/Output Summary (Last 24 hours) at 08/15/16 1517 Last data filed at 08/15/16 1246  Gross per 24 hour  Intake              240 ml  Output             1150 ml  Net             -910 ml   Filed Weights   08/14/16 0945 08/14/16 1429  Weight: 66.7 kg (147 lb) 67.3 kg (148 lb 4.8 oz)    Examination:  General exam: Appears calm and comfortable  Respiratory system: Clear to auscultation. Respiratory effort normal. Cardiovascular system: S1 & S2 heard, Rate controlled  Gastrointestinal system: Abdomen is nondistended, soft and nontender. No organomegaly or masses felt. Normal bowel sounds heard. Central nervous system: Alert and oriented. No focal neurological deficits. Extremities: Symmetric 5 x 5 power. Skin: No rashes, lesions or ulcers Psychiatry: Judgement and insight appear normal. Mood & affect appropriate.   Data Reviewed: I have personally reviewed following labs and imaging studies  CBC:  Recent Labs Lab 08/14/16 0944 08/14/16 1828  WBC 12.7* 9.3  HGB 14.0 13.5  HCT 41.4 39.2  MCV 91.8 89.7  PLT 178 [id number]   Basic Metabolic Panel:  Recent Labs Lab  08/14/16 0944 08/14/16 1612  NA 133*  --   K 4.5  --   CL 101  --   CO2 22  --   GLUCOSE 153*  --   BUN 21*  --   CREATININE 1.16* 0.95  CALCIUM 9.3  --    GFR: Estimated Creatinine Clearance: 37 mL/min (by C-G formula based on SCr of 0.95 mg/dL). Liver Function Tests: No results for input(s): AST, ALT, ALKPHOS, BILITOT, PROT, ALBUMIN in the last 168 hours. No results for input(s): LIPASE, AMYLASE in the last 168 hours. No results for input(s): AMMONIA in the last 168 hours. Coagulation Profile: No results for input(s): INR, PROTIME in the last 168 hours. Cardiac Enzymes:  Recent Labs Lab 08/14/16 1612 08/14/16 1828 08/15/16 0040  TROPONINI 0.05* 0.09* 0.07*   BNP (last 3 results) No results for input(s): PROBNP in the last 8760 hours. HbA1C: No results  for input(s): HGBA1C in the last 72 hours. CBG: No results for input(s): GLUCAP in the last 168 hours. Lipid Profile:  Recent Labs  08/15/16 0040  CHOL 149  HDL 44  LDLCALC 88  TRIG 83  CHOLHDL 3.4   Thyroid Function Tests: No results for input(s): TSH, T4TOTAL, FREET4, T3FREE, THYROIDAB in the last 72 hours. Anemia Panel: No results for input(s): VITAMINB12, FOLATE, FERRITIN, TIBC, IRON, RETICCTPCT in the last 72 hours. Urine analysis:    Component Value Date/Time   COLORURINE YELLOW 06/18/2010 0925   APPEARANCEUR HAZY (A) 06/18/2010 0925   LABSPEC 1.021 06/18/2010 0925   PHURINE 6.0 06/18/2010 0925   GLUCOSEU NEGATIVE 06/18/2010 0925   HGBUR NEGATIVE 06/18/2010 0925   BILIRUBINUR NEGATIVE 06/18/2010 0925   KETONESUR NEGATIVE 06/18/2010 0925   PROTEINUR NEGATIVE 06/18/2010 0925   UROBILINOGEN 0.2 06/18/2010 0925   NITRITE NEGATIVE 06/18/2010 0925   LEUKOCYTESUR MODERATE (A) 06/18/2010 0925   Sepsis Labs: @LABRCNTIP (procalcitonin:4,lacticidven:4)   )No results found for this or any previous visit (from the past 240 hour(s)).    Radiology Studies: Dg Chest 2 View Result Date: 08/14/2016 No active cardiopulmonary disease.    Scheduled Meds: . aspirin  81 mg Oral Daily  . carvedilol  6.25 mg Oral BID WC  . ezetimibe  10 mg Oral Daily  . heparin  5,000 Units Subcutaneous Q8H   Continuous Infusions:   LOS: 0 days    Time spent: 15 minutes  Greater than 50% of the time spent on counseling and coordinating the care.   Leisa Lenz, MD [facility] Pager [phone]  If 7PM-7AM, please contact night-coverage [email or web address] Password [facility] 08/15/2016, 3:17 PM

## 2016-08-16 DIAGNOSIS — E7800 Pure hypercholesterolemia, unspecified: Secondary | ICD-10-CM | POA: Diagnosis not present

## 2016-08-16 DIAGNOSIS — I119 Hypertensive heart disease without heart failure: Secondary | ICD-10-CM | POA: Diagnosis not present

## 2016-08-16 DIAGNOSIS — R079 Chest pain, unspecified: Secondary | ICD-10-CM | POA: Diagnosis not present

## 2016-08-16 DIAGNOSIS — R0789 Other chest pain: Secondary | ICD-10-CM | POA: Diagnosis not present

## 2016-08-16 MED ORDER — CARVEDILOL 6.25 MG PO TABS: 6.2500 mg | ORAL_TABLET | Freq: Two times a day (BID) | ORAL | 0 refills | Status: DC

## 2016-08-16 NOTE — Progress Notes (Signed)
Subjective:  Denies SSCP, palpitations or Dyspnea   Objective:  Vitals:   08/15/16 1602 08/15/16 2009 08/16/16 0500 08/16/16 0800  BP:  (!) 153/60 (!) 156/68   Pulse: 85 89 93 (!) 104  Resp:  18 18 20   Temp:  98.4 F (36.9 C) 97.7 F (36.5 C)   TempSrc:  Oral Oral   SpO2:  95% 98% 98%  Weight:   147 lb 6.4 oz (66.9 kg)   Height:        Intake/Output from previous day:  Intake/Output Summary (Last 24 hours) at 08/16/16 0932 Last data filed at 08/16/16 0542  Gross per 24 hour  Intake              240 ml  Output             1200 ml  Net             -960 ml    Physical Exam: Affect appropriate Healthy:  appears stated age HEENT: normal Neck supple with no adenopathy JVP normal no bruits no thyromegaly Lungs clear with no wheezing and good diaphragmatic motion Heart:  S1/S2 no murmur, no rub, gallop or click PMI normal Abdomen: benighn, BS positve, no tenderness, no AAA no bruit.  No HSM or HJR Distal pulses intact with no bruits No edema Neuro non-focal Skin warm and dry No muscular weakness Right radial A   Lab Results: Basic Metabolic Panel:  Recent Labs  08/14/16 0944 08/14/16 1612  NA 133*  --   K 4.5  --   CL 101  --   CO2 22  --   GLUCOSE 153*  --   BUN 21*  --   CREATININE 1.16* 0.95  CALCIUM 9.3  --    CBC:  Recent Labs  08/14/16 0944 08/14/16 1828  WBC 12.7* 9.3  HGB 14.0 13.5  HCT 41.4 39.2  MCV 91.8 89.7  PLT 178 185   Cardiac Enzymes:  Recent Labs  08/14/16 1612 08/14/16 1828 08/15/16 0040  TROPONINI 0.05* 0.09* 0.07*   Fasting Lipid Panel:  Recent Labs  08/15/16 0040  CHOL 149  HDL 44  LDLCALC 88  TRIG 83  CHOLHDL 3.4   Imaging: Dg Chest 2 View  Result Date: 08/14/2016 CLINICAL DATA:  Chest pain and weakness beginning this morning. EXAM: CHEST  2 VIEW COMPARISON:  02/05/2015 FINDINGS: The cardiac silhouette is normal in size and configuration. No mediastinal or hilar masses. No evidence of adenopathy.  Stable mild chronic bronchitic change in the medial right lower lobe. Lungs are otherwise clear. No pleural effusion. No pneumothorax. Skeletal structures are demineralized but intact. IMPRESSION: No active cardiopulmonary disease. Electronically Signed   By: [name] M.D.   On: 08/14/2016 10:15    Cardiac Studies:  ECG: SR  Normal    Telemetry:  NSR   Echo: pending   Medications:   . aspirin  81 mg Oral Daily  . carvedilol  6.25 mg Oral BID WC  . cycloSPORINE  1 drop Both Eyes QHS  . ezetimibe  10 mg Oral Daily  . heparin  5,000 Units Subcutaneous Q8H  . sodium chloride flush  3 mL Intravenous Q12H      Assessment/Plan:   Chest Pain:  Cath yesterday with moderate non obstructive LAD disease continue ASA will increase Ok to resume ACE CR ok Echo normal EF no bad valves ok to d/c home today Discussed cath And echo results with patient  Jenkins Rouge 08/16/2016, 9:32 AM

## 2016-08-16 NOTE — Discharge Summary (Signed)
Physician Discharge Summary  Shannon Serrano [name] DOB: 04-Sep-1928 DOA: 08/14/2016  PCP: Mathews Argyle, MD  Admit date: 08/14/2016 Discharge date: 08/16/2016  Recommendations for Outpatient Follow-up:  1. Please note we changed metoprolol to coreg   Discharge Diagnoses:  Principal Problem:   Chest pain Active Problems:   Benign hypertensive heart disease without heart failure   Hypercholesterolemia    Discharge Condition: stable   Diet recommendation: as tolerated   History of present illness:  83 y.o.femalewith past medical history significant for for myalgia, hypertension, chronic kidney disease, radiation for breast cancer, stroke who presented to ED with chest pain started on the morning of the admission. SHe was outside pruning a tree in her sun room around 7 AM when she had acute onset of chest pressure. She rated the pain as severe, 10/10 in intensity that did not go away with rest.  Patient called EMS and the rescue squad was there within 5 minutes. They administered nitroglycerin and aspirin which has helped with pain relief.  In the emergency room EKG showed no acute ST changes. Chest x-ray was negative for acute cardiopulmonary findings. Patient had one negative troponin. Seen by cardio and underwent cath 2/16.  Hospital Course:    Assessment & Plan:   Principal Problem:   Chest pain - Trop 0.05 --> 0.09 --> 0.07 - Cardiac cath 2/16 showed moderate non obstructive LAD disease - Continue ASA and carvedilol  - ECHO - EF 60-65%, grade 1 DD  Active Problems:   Benign hypertension - Continue carvedilol and lisinopril     Hypercholesterolemia - Continue zetia     Acute renal failure - Likely prerenal - Repeat Cr WNL   DVT prophylaxis: Heparin subQ Code Status: full code  Family Communication: no family at the bedside   Consultants:   Cardio  Procedures:   Cardiac cath  ECHO  Antimicrobials:   None     Signed:  Leisa Lenz, MD  [facility] 08/16/2016, 9:27 AM  Pager #: [phone]  Time spent in minutes: less than 30 minutes    Discharge Exam: Vitals:   08/16/16 0500 08/16/16 0800  BP: (!) 156/68   Pulse: 93 (!) 104  Resp: 18 20  Temp: 97.7 F (36.5 C)    Vitals:   08/15/16 1602 08/15/16 2009 08/16/16 0500 08/16/16 0800  BP:  (!) 153/60 (!) 156/68   Pulse: 85 89 93 (!) 104  Resp:  18 18 20   Temp:  98.4 F (36.9 C) 97.7 F (36.5 C)   TempSrc:  Oral Oral   SpO2:  95% 98% 98%  Weight:   66.9 kg (147 lb 6.4 oz)   Height:        General: Pt is alert, follows commands appropriately, not in acute distress Cardiovascular: Regular rate and rhythm, S1/S2 + Respiratory: Clear to auscultation bilaterally, no wheezing, no crackles, no rhonchi Abdominal: Soft, non tender, non distended, bowel sounds +, no guarding Extremities: no edema, no cyanosis, pulses palpable bilaterally DP and PT Neuro: Grossly nonfocal  Discharge Instructions  Discharge Instructions    Call MD for:  persistant nausea and vomiting    Complete by:  As directed    Call MD for:  redness, tenderness, or signs of infection (pain, swelling, redness, odor or green/yellow discharge around incision site)    Complete by:  As directed    Call MD for:  severe uncontrolled pain    Complete by:  As directed    Diet - low sodium heart  healthy    Complete by:  As directed    Increase activity slowly    Complete by:  As directed      Allergies as of 08/16/2016      Reactions   Ciprofloxacin    Unable to take due to repeated use for uti   Codeine Other (See Comments)   Unknown    Eszopiclone Other (See Comments)   Unknown    Morphine Other (See Comments)   Unknown    Morphine And Related Nausea And Vomiting   Oxycodone-acetaminophen Other (See Comments)   Unknown    Penicillins Nausea And Vomiting       Sulfa Antibiotics Hives      Medication List    STOP taking these medications    metoprolol 50 MG tablet Commonly known as:  LOPRESSOR     TAKE these medications   aspirin 81 MG tablet Take 81 mg by mouth daily.   B COMPLEX PO Take 1 Dose by mouth daily.   CALCIUM 1000 + D PO Take 1,200 tablets by mouth daily.   carvedilol 6.25 MG tablet Commonly known as:  COREG Take 1 tablet (6.25 mg total) by mouth 2 (two) times daily with a meal.   co-enzyme Q-10 30 MG capsule Take 30 mg by mouth daily.   cyclobenzaprine 10 MG tablet Commonly known as:  FLEXERIL TAKE 1 TABLET BY MOUTH THREE TIMES DAILY AS NEEDED FOR MUSCLE SPASMS What changed:  See the new instructions.   lisinopril 20 MG tablet Commonly known as:  PRINIVIL,ZESTRIL TAKE 1/2 TABLET=10MG  BY MOUTH TWICE DAILY   magnesium 30 MG tablet Take 30 mg by mouth at bedtime.   multivitamin with minerals Tabs tablet Take 1 tablet by mouth daily.   RESTASIS 0.05 % ophthalmic emulsion Generic drug:  cycloSPORINE INSTILL 1 DROP INTO BOTH EYES EVERY EVENING   valACYclovir 500 MG tablet Commonly known as:  VALTREX Take 1 tablet (500 mg total) by mouth 2 (two) times daily. X 3 days only at onset of outbreak   VITAMIN D PO Take 1 Dose by mouth daily.   ZETIA 10 MG tablet Generic drug:  ezetimibe TAKE 1 TABLET BY MOUTH DAILY      Follow-up Information    Mathews Argyle, MD. Schedule an appointment as soon as possible for a visit.   Specialty:  Internal Medicine Contact information: [address] [place] [address] [phone]            The results of significant diagnostics from this hospitalization (including imaging, microbiology, ancillary and laboratory) are listed below for reference.    Significant Diagnostic Studies: Dg Chest 2 View  Result Date: 08/14/2016 CLINICAL DATA:  Chest pain and weakness beginning this morning. EXAM: CHEST  2 VIEW COMPARISON:  02/05/2015 FINDINGS: The cardiac silhouette is normal in size and configuration. No mediastinal or hilar  masses. No evidence of adenopathy. Stable mild chronic bronchitic change in the medial right lower lobe. Lungs are otherwise clear. No pleural effusion. No pneumothorax. Skeletal structures are demineralized but intact. IMPRESSION: No active cardiopulmonary disease. Electronically Signed   By: [name] M.D.   On: 08/14/2016 10:15   Mm Diag Breast Tomo Bilateral  Result Date: 07/21/2016 CLINICAL DATA:  History of left breast cancer in 2011 status post lumpectomy and radiation therapy. EXAM: 2D DIGITAL DIAGNOSTIC BILATERAL MAMMOGRAM WITH CAD AND ADJUNCT TOMO COMPARISON:  Previous exam(s). ACR Breast Density Category b: There are scattered areas of fibroglandular density. FINDINGS: There  are stable postsurgical changes within the left breast. There are no new dominant masses, suspicious calcifications or secondary signs of malignancy within either breast. Mammographic images were processed with CAD. IMPRESSION: No evidence of malignancy within either breast. Stable postsurgical changes RECOMMENDATION: Bilateral diagnostic mammogram in 1 year. I have discussed the findings and recommendations with the patient. Results were also provided in writing at the conclusion of the visit. If applicable, a reminder letter will be sent to the patient regarding the next appointment. BI-RADS CATEGORY  2: Benign. Electronically Signed   By: Franki Cabot M.D.   On: 07/21/2016 13:41    Microbiology: No results found for this or any previous visit (from the past 240 hour(s)).   Labs: Basic Metabolic Panel:  Recent Labs Lab 08/14/16 0944 08/14/16 1612  NA 133*  --   K 4.5  --   CL 101  --   CO2 22  --   GLUCOSE 153*  --   BUN 21*  --   CREATININE 1.16* 0.95  CALCIUM 9.3  --    Liver Function Tests: No results for input(s): AST, ALT, ALKPHOS, BILITOT, PROT, ALBUMIN in the last 168 hours. No results for input(s): LIPASE, AMYLASE in the last 168 hours. No results for input(s): AMMONIA in the last 168  hours. CBC:  Recent Labs Lab 08/14/16 0944 08/14/16 1828  WBC 12.7* 9.3  HGB 14.0 13.5  HCT 41.4 39.2  MCV 91.8 89.7  PLT 178 185   Cardiac Enzymes:  Recent Labs Lab 08/14/16 1612 08/14/16 1828 08/15/16 0040  TROPONINI 0.05* 0.09* 0.07*   BNP: BNP (last 3 results)  Recent Labs  10/30/15 1455 08/14/16 1828  BNP 38.2 43.1    ProBNP (last 3 results) No results for input(s): PROBNP in the last 8760 hours.  CBG: No results for input(s): GLUCAP in the last 168 hours.

## 2016-08-16 NOTE — Discharge Instructions (Addendum)
Chest Wall Pain °Chest wall pain is pain in or around the bones and muscles of your chest. Sometimes, an injury causes this pain. Sometimes, the cause may not be known. This pain may take several weeks or longer to get better. °Follow these instructions at home: °Pay attention to any changes in your symptoms. Take these actions to help with your pain: °· Rest as told by your health care provider. °· Avoid activities that cause pain. These include any activities that use your chest muscles or your abdominal and side muscles to lift heavy items. °· If directed, apply ice to the painful area: °¨ Put ice in a plastic bag. °¨ Place a towel between your skin and the bag. °¨ Leave the ice on for 20 minutes, 2-3 times per day. °· Take over-the-counter and prescription medicines only as told by your health care provider. °· Do not use tobacco products, including cigarettes, chewing tobacco, and e-cigarettes. If you need help quitting, ask your health care provider. °· Keep all follow-up visits as told by your health care provider. This is important. °Contact a health care provider if: °· You have a fever. °· Your chest pain becomes worse. °· You have new symptoms. °Get help right away if: °· You have nausea or vomiting. °· You feel sweaty or light-headed. °· You have a cough with phlegm (sputum) or you cough up blood. °· You develop shortness of breath. °This information is not intended to replace advice given to you by your health care provider. Make sure you discuss any questions you have with your health care provider. °Document Released: 06/15/2005 Document Revised: 10/24/2015 Document Reviewed: 09/10/2014 °Elsevier Interactive Patient Education © 2017 [employer] ° °

## 2016-08-17 DIAGNOSIS — I129 Hypertensive chronic kidney disease with stage 1 through stage 4 chronic kidney disease, or unspecified chronic kidney disease: Secondary | ICD-10-CM | POA: Diagnosis not present

## 2016-08-17 DIAGNOSIS — N183 Chronic kidney disease, stage 3 (moderate): Secondary | ICD-10-CM | POA: Diagnosis not present

## 2016-08-17 DIAGNOSIS — G3184 Mild cognitive impairment, so stated: Secondary | ICD-10-CM | POA: Diagnosis not present

## 2016-08-17 MED FILL — Lidocaine HCl Local Preservative Free (PF) Inj 1%: INTRAMUSCULAR | Qty: 30 | Status: AC

## 2016-08-29 DIAGNOSIS — B029 Zoster without complications: Secondary | ICD-10-CM | POA: Diagnosis not present

## 2016-08-29 DIAGNOSIS — N12 Tubulo-interstitial nephritis, not specified as acute or chronic: Secondary | ICD-10-CM | POA: Diagnosis not present

## 2016-08-29 DIAGNOSIS — N390 Urinary tract infection, site not specified: Secondary | ICD-10-CM | POA: Diagnosis not present

## 2016-09-14 DIAGNOSIS — N3020 Other chronic cystitis without hematuria: Secondary | ICD-10-CM | POA: Diagnosis not present

## 2016-09-14 DIAGNOSIS — N281 Cyst of kidney, acquired: Secondary | ICD-10-CM | POA: Diagnosis not present

## 2016-09-14 DIAGNOSIS — N261 Atrophy of kidney (terminal): Secondary | ICD-10-CM | POA: Diagnosis not present

## 2016-10-02 DIAGNOSIS — I129 Hypertensive chronic kidney disease with stage 1 through stage 4 chronic kidney disease, or unspecified chronic kidney disease: Secondary | ICD-10-CM | POA: Diagnosis not present

## 2016-10-02 DIAGNOSIS — N183 Chronic kidney disease, stage 3 (moderate): Secondary | ICD-10-CM | POA: Diagnosis not present

## 2016-10-02 DIAGNOSIS — Z23 Encounter for immunization: Secondary | ICD-10-CM | POA: Diagnosis not present

## 2016-10-19 DIAGNOSIS — R399 Unspecified symptoms and signs involving the genitourinary system: Secondary | ICD-10-CM | POA: Diagnosis not present

## 2016-10-25 ENCOUNTER — Encounter (HOSPITAL_COMMUNITY): Payer: Self-pay | Admitting: Emergency Medicine

## 2016-10-25 ENCOUNTER — Emergency Department (HOSPITAL_COMMUNITY): Payer: Medicare Other

## 2016-10-25 ENCOUNTER — Inpatient Hospital Stay (HOSPITAL_COMMUNITY)
Admission: EM | Admit: 2016-10-25 | Discharge: 2016-10-27 | DRG: 641 | Disposition: A | Discharge status: Home / Self Care | Payer: Medicare Other | Attending: Family Medicine | Admitting: Family Medicine

## 2016-10-25 DIAGNOSIS — N3000 Acute cystitis without hematuria: Secondary | ICD-10-CM | POA: Diagnosis not present

## 2016-10-25 DIAGNOSIS — E860 Dehydration: Secondary | ICD-10-CM | POA: Diagnosis not present

## 2016-10-25 DIAGNOSIS — R1013 Epigastric pain: Secondary | ICD-10-CM | POA: Diagnosis not present

## 2016-10-25 DIAGNOSIS — I2510 Atherosclerotic heart disease of native coronary artery without angina pectoris: Secondary | ICD-10-CM | POA: Diagnosis present

## 2016-10-25 DIAGNOSIS — Z923 Personal history of irradiation: Secondary | ICD-10-CM

## 2016-10-25 DIAGNOSIS — M5430 Sciatica, unspecified side: Secondary | ICD-10-CM | POA: Diagnosis present

## 2016-10-25 DIAGNOSIS — B952 Enterococcus as the cause of diseases classified elsewhere: Secondary | ICD-10-CM | POA: Diagnosis present

## 2016-10-25 DIAGNOSIS — E785 Hyperlipidemia, unspecified: Secondary | ICD-10-CM | POA: Diagnosis present

## 2016-10-25 DIAGNOSIS — Z85828 Personal history of other malignant neoplasm of skin: Secondary | ICD-10-CM

## 2016-10-25 DIAGNOSIS — N183 Chronic kidney disease, stage 3 unspecified: Secondary | ICD-10-CM | POA: Diagnosis present

## 2016-10-25 DIAGNOSIS — I129 Hypertensive chronic kidney disease with stage 1 through stage 4 chronic kidney disease, or unspecified chronic kidney disease: Secondary | ICD-10-CM | POA: Diagnosis present

## 2016-10-25 DIAGNOSIS — R197 Diarrhea, unspecified: Secondary | ICD-10-CM

## 2016-10-25 DIAGNOSIS — R778 Other specified abnormalities of plasma proteins: Secondary | ICD-10-CM | POA: Diagnosis present

## 2016-10-25 DIAGNOSIS — Z9049 Acquired absence of other specified parts of digestive tract: Secondary | ICD-10-CM

## 2016-10-25 DIAGNOSIS — R7989 Other specified abnormal findings of blood chemistry: Secondary | ICD-10-CM | POA: Diagnosis present

## 2016-10-25 DIAGNOSIS — K2970 Gastritis, unspecified, without bleeding: Secondary | ICD-10-CM | POA: Diagnosis not present

## 2016-10-25 DIAGNOSIS — Z8673 Personal history of transient ischemic attack (TIA), and cerebral infarction without residual deficits: Secondary | ICD-10-CM

## 2016-10-25 DIAGNOSIS — J9811 Atelectasis: Secondary | ICD-10-CM | POA: Diagnosis not present

## 2016-10-25 DIAGNOSIS — K529 Noninfective gastroenteritis and colitis, unspecified: Secondary | ICD-10-CM | POA: Diagnosis present

## 2016-10-25 DIAGNOSIS — Z8249 Family history of ischemic heart disease and other diseases of the circulatory system: Secondary | ICD-10-CM

## 2016-10-25 DIAGNOSIS — Z90710 Acquired absence of both cervix and uterus: Secondary | ICD-10-CM

## 2016-10-25 DIAGNOSIS — Z86718 Personal history of other venous thrombosis and embolism: Secondary | ICD-10-CM

## 2016-10-25 DIAGNOSIS — R112 Nausea with vomiting, unspecified: Secondary | ICD-10-CM | POA: Diagnosis not present

## 2016-10-25 DIAGNOSIS — Z800 Family history of malignant neoplasm of digestive organs: Secondary | ICD-10-CM

## 2016-10-25 DIAGNOSIS — H409 Unspecified glaucoma: Secondary | ICD-10-CM | POA: Diagnosis present

## 2016-10-25 DIAGNOSIS — R748 Abnormal levels of other serum enzymes: Secondary | ICD-10-CM | POA: Diagnosis present

## 2016-10-25 DIAGNOSIS — Z79899 Other long term (current) drug therapy: Secondary | ICD-10-CM

## 2016-10-25 DIAGNOSIS — M797 Fibromyalgia: Secondary | ICD-10-CM | POA: Diagnosis not present

## 2016-10-25 DIAGNOSIS — Z853 Personal history of malignant neoplasm of breast: Secondary | ICD-10-CM

## 2016-10-25 DIAGNOSIS — Z833 Family history of diabetes mellitus: Secondary | ICD-10-CM

## 2016-10-25 LAB — URINALYSIS, ROUTINE W REFLEX MICROSCOPIC
Bacteria, UA: NONE SEEN
Bilirubin Urine: NEGATIVE
Glucose, UA: NEGATIVE mg/dL
Hgb urine dipstick: NEGATIVE
Ketones, ur: 20 mg/dL — AB
Nitrite: NEGATIVE
Protein, ur: 30 mg/dL — AB
Specific Gravity, Urine: 1.016 (ref 1.005–1.030)
Squamous Epithelial / LPF: NONE SEEN
pH: 5 (ref 5.0–8.0)

## 2016-10-25 LAB — COMPREHENSIVE METABOLIC PANEL
ALT: 23 U/L (ref 14–54)
AST: 37 U/L (ref 15–41)
Albumin: 4.2 g/dL (ref 3.5–5.0)
Alkaline Phosphatase: 60 U/L (ref 38–126)
Anion gap: 13 (ref 5–15)
BUN: 19 mg/dL (ref 6–20)
CO2: 19 mmol/L — ABNORMAL LOW (ref 22–32)
Calcium: 9.8 mg/dL (ref 8.9–10.3)
Chloride: 101 mmol/L (ref 101–111)
Creatinine, Ser: 1 mg/dL (ref 0.44–1.00)
GFR calc Af Amer: 58 mL/min — ABNORMAL LOW (ref >60)
GFR calc non Af Amer: 50 mL/min — ABNORMAL LOW (ref >60)
Glucose, Bld: 117 mg/dL — ABNORMAL HIGH (ref 65–99)
Potassium: 4.5 mmol/L (ref 3.5–5.1)
Sodium: 133 mmol/L — ABNORMAL LOW (ref 135–145)
Total Bilirubin: 1.1 mg/dL (ref 0.3–1.2)
Total Protein: 7.3 g/dL (ref 6.5–8.1)

## 2016-10-25 LAB — CBC WITH DIFFERENTIAL/PLATELET
Basophils Absolute: 0 10*3/uL (ref 0.0–0.1)
Basophils Relative: 0 %
Eosinophils Absolute: 0.4 10*3/uL (ref 0.0–0.7)
Eosinophils Relative: 3 %
HCT: 46.2 % — ABNORMAL HIGH (ref 36.0–46.0)
Hemoglobin: 15.6 g/dL — ABNORMAL HIGH (ref 12.0–15.0)
Lymphocytes Relative: 24 %
Lymphs Abs: 3.1 10*3/uL (ref 0.7–4.0)
MCH: 32 pg (ref 26.0–34.0)
MCHC: 33.8 g/dL (ref 30.0–36.0)
MCV: 94.7 fL (ref 78.0–100.0)
Monocytes Absolute: 1.1 10*3/uL — ABNORMAL HIGH (ref 0.1–1.0)
Monocytes Relative: 9 %
Neutro Abs: 8.5 10*3/uL — ABNORMAL HIGH (ref 1.7–7.7)
Neutrophils Relative %: 64 %
Platelets: 206 10*3/uL (ref 150–400)
RBC: 4.88 MIL/uL (ref 3.87–5.11)
RDW: 14.5 % (ref 11.5–15.5)
WBC: 13.2 10*3/uL — ABNORMAL HIGH (ref 4.0–10.5)

## 2016-10-25 LAB — GASTROINTESTINAL PANEL BY PCR, STOOL (REPLACES STOOL CULTURE)

## 2016-10-25 LAB — C DIFFICILE QUICK SCREEN W PCR REFLEX
C Diff antigen: NEGATIVE
C Diff interpretation: NOT DETECTED
C Diff toxin: NEGATIVE

## 2016-10-25 LAB — TROPONIN I
Troponin I: 0.03 ng/mL (ref <0.03)
Troponin I: <0.03 ng/mL (ref <0.03)

## 2016-10-25 LAB — LACTIC ACID, PLASMA: Lactic Acid, Venous: 1.6 mmol/L (ref 0.5–1.9)

## 2016-10-25 LAB — POC OCCULT BLOOD, ED: Fecal Occult Bld: POSITIVE — AB

## 2016-10-25 LAB — BRAIN NATRIURETIC PEPTIDE: B Natriuretic Peptide: 26.3 pg/mL (ref 0.0–100.0)

## 2016-10-25 LAB — LIPASE, BLOOD: Lipase: 43 U/L (ref 11–51)

## 2016-10-25 MED ORDER — CYCLOSPORINE 0.05 % OP EMUL
1.0000 [drp] | Freq: Every day | OPHTHALMIC | Status: DC
  Administered 2016-10-25 – 2016-10-26 (×2): 1 [drp] via OPHTHALMIC
  Filled 2016-10-25 (×2): qty 1

## 2016-10-25 MED ORDER — IOPAMIDOL (ISOVUE-300) INJECTION 61%
INTRAVENOUS | Status: AC
  Filled 2016-10-25: qty 100

## 2016-10-25 MED ORDER — NITROGLYCERIN 0.4 MG SL SUBL: 0.4000 mg | SUBLINGUAL_TABLET | SUBLINGUAL | Status: DC | PRN

## 2016-10-25 MED ORDER — CARVEDILOL 6.25 MG PO TABS
6.2500 mg | ORAL_TABLET | Freq: Two times a day (BID) | ORAL | Status: DC
Start: 2016-10-25 — End: 2016-10-27
  Administered 2016-10-25 – 2016-10-27 (×4): 6.25 mg via ORAL
  Filled 2016-10-25 (×4): qty 1

## 2016-10-25 MED ORDER — ONDANSETRON HCL 4 MG PO TABS: 4.0000 mg | ORAL_TABLET | Freq: Four times a day (QID) | ORAL | Status: DC | PRN

## 2016-10-25 MED ORDER — EZETIMIBE 10 MG PO TABS
10.0000 mg | ORAL_TABLET | Freq: Every day | ORAL | Status: DC
Start: 2016-10-26 — End: 2016-10-27
  Administered 2016-10-26 – 2016-10-27 (×2): 10 mg via ORAL
  Filled 2016-10-25 (×2): qty 1

## 2016-10-25 MED ORDER — SODIUM CHLORIDE 0.9 % IV BOLUS (SEPSIS)
1000.0000 mL | Freq: Once | INTRAVENOUS | Status: AC
  Administered 2016-10-25: 1000 mL via INTRAVENOUS

## 2016-10-25 MED ORDER — VANCOMYCIN HCL IN DEXTROSE 750-5 MG/150ML-% IV SOLN
750.0000 mg | INTRAVENOUS | Status: DC
  Administered 2016-10-25: 750 mg via INTRAVENOUS
  Filled 2016-10-25: qty 150

## 2016-10-25 MED ORDER — ADULT MULTIVITAMIN W/MINERALS CH
1.0000 | ORAL_TABLET | Freq: Every day | ORAL | Status: DC
  Administered 2016-10-26 – 2016-10-27 (×2): 1 via ORAL
  Filled 2016-10-25 (×2): qty 1

## 2016-10-25 MED ORDER — MAGNESIUM 30 MG PO TABS: 30.0000 mg | ORAL_TABLET | Freq: Every day | ORAL | Status: DC

## 2016-10-25 MED ORDER — ASPIRIN 325 MG PO TABS
325.0000 mg | ORAL_TABLET | Freq: Every day | ORAL | Status: DC
  Administered 2016-10-25 – 2016-10-27 (×3): 325 mg via ORAL
  Filled 2016-10-25 (×3): qty 1

## 2016-10-25 MED ORDER — ONDANSETRON HCL 4 MG/2ML IJ SOLN: 4.0000 mg | Freq: Four times a day (QID) | INTRAMUSCULAR | Status: DC | PRN

## 2016-10-25 MED ORDER — LISINOPRIL 10 MG PO TABS
10.0000 mg | ORAL_TABLET | Freq: Two times a day (BID) | ORAL | Status: DC
  Administered 2016-10-25 – 2016-10-26 (×2): 10 mg via ORAL
  Filled 2016-10-25 (×2): qty 1

## 2016-10-25 MED ORDER — ACETAMINOPHEN 650 MG RE SUPP: 650.0000 mg | Freq: Four times a day (QID) | RECTAL | Status: DC | PRN

## 2016-10-25 MED ORDER — SODIUM CHLORIDE 0.9 % IV SOLN
INTRAVENOUS | Status: DC
  Administered 2016-10-25 – 2016-10-26 (×2): via INTRAVENOUS

## 2016-10-25 MED ORDER — ACETAMINOPHEN 325 MG PO TABS: 650.0000 mg | ORAL_TABLET | Freq: Four times a day (QID) | ORAL | Status: DC | PRN

## 2016-10-25 MED ORDER — HYDRALAZINE HCL 20 MG/ML IJ SOLN
10.0000 mg | Freq: Three times a day (TID) | INTRAMUSCULAR | Status: DC | PRN
Start: 2016-10-25 — End: 2016-10-27

## 2016-10-25 NOTE — Progress Notes (Signed)
Pharmacy Antibiotic Note  Shannon Serrano is a 83 y.o. female admitted on 10/25/2016 with history of recurrent Enterococcus UTI (S-ampicillin, vancomycin).  Pharmacy has been consulted for vancomcyin dosing. Discussed patient with Dr. Aggie Moats about potentially using ampicillin (due to patient's PCN allergy listed as N/V), at this time more comfortable with using vancomycin. She has been managed by Urology at [facility], with previous culture history in [place]. Patient has CKD3 and only one kidney, will be cautious with vancomycin dosing.  Plan: - Vancomycin 750 mg IV q24h - Monitor renal function, C&S and duration of treatment - Monitor VT at steady state  Height: 5' (152.4 cm) Weight: 144 lb 14.4 oz (65.7 kg) IBW/kg (Calculated) : 45.5  Temp (24hrs), Avg:98.2 F (36.8 C), Min:97.9 F (36.6 C), Max:98.4 F (36.9 C)   Recent Labs Lab 10/25/16 1245  WBC 13.2*  CREATININE 1.00  LATICACIDVEN 1.6    Estimated Creatinine Clearance: 34.8 mL/min (by C-G formula based on SCr of 1 mg/dL).    Allergies  Allergen Reactions  . Morphine Nausea And Vomiting  . Morphine And Related Nausea And Vomiting  . Ciprofloxacin Other (See Comments)    Unable to take due to repeated use for uti  . Codeine Nausea Only  . Eszopiclone Other (See Comments)    Short term memory loss - reaction to [place]  . Nitrofurantoin Nausea Only  . Oxycodone-Acetaminophen Other (See Comments)    Unknown   . Penicillins Nausea Only     Has patient had a PCN reaction causing immediate rash, facial/tongue/throat swelling, SOB or lightheadedness with hypotension: No Has patient had a PCN reaction causing severe rash involving mucus membranes or skin necrosis: No Has patient had a PCN reaction that required hospitalization No Has patient had a PCN reaction occurring within the last 10 years: No If all of the above answers are "NO", then may proceed with Cephalosporin use.  . Sulfa Antibiotics Hives     Antimicrobials this admission: Vancomycin 4/29 >>   Microbiology results: 4/29 GI panel: negative 4/29 CDiff: negative 4/29 UCx: sent  Thank you for allowing pharmacy to be a part of this patient's care.  Dimitri Ped, PharmD, BCPS PGY-2 Infectious Diseases Pharmacy Resident Pager: [phone] 10/25/2016 6:42 PM

## 2016-10-25 NOTE — ED Provider Notes (Signed)
[place] DEPT Provider Note   CSN: [id number] Arrival date & time: 10/25/16  1204     History   Chief Complaint Chief Complaint  Patient presents with  . Diarrhea  . Abdominal Pain    HPI Shannon Serrano is a 83 y.o. female who presents with epigastric abdominal pain and diarrhea that began this morning. Patient states that her abdominal pain occurred this morning and was very intense. She describes the pain as a cramping pain that did not radiate.  Currently on ED arrival her pain is on a 1 out of 10. She does report that she got very nauseous and diaphoretic during the episode of pain. Patient states that shortly after the pain began to experience episodes of diarrhea. She states that her stool is very loose but denies any black or tarry stools or any bright red blood. Since coming to the hospital she has had more episodes of watery, loose stools. She states that her abdominal pain is worsened right before she has a bowel movement and is improved with a bowel movement. No other aggravating or alleviating factors. She denies any recent illness or fevers. She denies any recent antibiotic use. She states that she recently had a urine culture that showed enterococcus. She is scheduled to go to [facility] tomorrow for start of medications. She denies any chest pain, difficult breathing, vomiting, weakness. She has a history of cholecystectomy, appendectomy, hysterectomy.    The history is provided by the patient.    Past Medical History:  Diagnosis Date  . Clavicle fracture 04/2004  . 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 Diverticulosis   . DVT (deep venous thrombosis) ([place]) 11/2007   Left leg- on lovenox x 1 injection only   . Global amnesia 11/2006  . Hearing loss   . History of fibromyalgia   . History of hysterectomy    History of hysterectomy for fibroids She still has her ovaries  . Hyperlipidemia    History of hyperlipidemia  . Hypertension   .  Kidney disease   . S/P radiation therapy 07/22/10 - 08/11/10   Whole Left Breast/42.56 Gy/16 Fradctions  . 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 Skin cancer L Lower Leg  . Skin cancer, basal cell    leg  . Stroke ([place]) 12/31/2007   mini stroke / has had several   . Syncope     Patient Active Problem List   Diagnosis Date Noted  . Dehydration 10/25/2016  . Chest pain 08/14/2016  . DCIS of upper outer quadrant of female breast 08/10/2014  . Jaw pain 07/12/2013  . DCIS of the breast 06/17/2012  . DCIS (ductal carcinoma in situ)   . Fasting hyperglycemia 05/18/2011  . Benign hypertensive heart disease without heart failure 12/05/2010  . Hypercholesterolemia 12/05/2010  . Ductal carcinoma in situ of breast 06/03/2010    Past Surgical History:  Procedure Laterality Date  . APPENDECTOMY  1948  . BREAST LUMPECTOMY Left 06/18/10    LEFT BREAST -HIGHG RADE DUCTAL CARCINOMA INSITU, ER+, PR+,  . DOPPLER ECHOCARDIOGRAPHY  05/20/2007   EF 55-60%  . eye surgery  08;09   excessive oil film removed from eyes  . INTRACAPSULAR CATARACT EXTRACTION Bilateral    She also had cataract surgery on the right eye  . LAPAROSCOPIC CHOLECYSTECTOMY  2001; 2004  . LEFT HEART CATH AND CORONARY ANGIOGRAPHY N/A 08/14/2016   Procedure: Left Heart Cath and  Coronary Angiography;  Surgeon: Nelva Bush, MD;  Location: [facility];  Service: Cardiovascular;  Laterality: N/A;  . VAGINAL HYSTERECTOMY  1978   fibroids    OB History    Gravida Para Term Preterm AB Living   6 5 4 1 1 3    SAB TAB Ectopic Multiple Live Births   1       1      Obstetric Comments   Menarche age 12, Parity age 63, No HRT Hysterectomy 1978       Home Medications    Prior to Admission medications   Medication Sig Start Date End Date Taking? Authorizing Provider  aspirin 81 MG tablet Take 81 mg by mouth daily.      Historical Provider, MD  B Complex Vitamins (B COMPLEX PO) Take 1 Dose by mouth  daily.     Historical Provider, MD  Calcium Carb-Cholecalciferol (CALCIUM 1000 + D PO) Take 1,200 tablets by mouth daily.      Historical Provider, MD  carvedilol (COREG) 6.25 MG tablet Take 1 tablet (6.25 mg total) by mouth 2 (two) times daily with a meal. 08/16/16   Robbie Lis, MD  Cholecalciferol (VITAMIN D PO) Take 1 Dose by mouth daily.     Historical Provider, MD  co-enzyme Q-10 30 MG capsule Take 30 mg by mouth daily.     Historical Provider, MD  cyclobenzaprine (FLEXERIL) 10 MG tablet TAKE 1 TABLET BY MOUTH THREE TIMES DAILY AS NEEDED FOR MUSCLE SPASMS Patient taking differently: TAKE 1 TABLET BY MOUTH EVERY EVENING AS NEEDED FOR MUSCLE SPASMS 03/07/13   Darlin Coco, MD  lisinopril (PRINIVIL,ZESTRIL) 20 MG tablet TAKE 1/2 TABLET=10MG  BY MOUTH TWICE DAILY 03/26/16   Historical Provider, MD  magnesium 30 MG tablet Take 30 mg by mouth at bedtime.    Historical Provider, MD  Multiple Vitamin (MULTIVITAMIN WITH MINERALS) TABS tablet Take 1 tablet by mouth daily.    Historical Provider, MD  RESTASIS 0.05 % ophthalmic emulsion INSTILL 1 DROP INTO BOTH EYES EVERY EVENING 01/31/16   Historical Provider, MD  valACYclovir (VALTREX) 500 MG tablet Take 1 tablet (500 mg total) by mouth 2 (two) times daily. X 3 days only at onset of outbreak 10/15/15   Regina Eck, CNM  ZETIA 10 MG tablet TAKE 1 TABLET BY MOUTH DAILY 03/16/13   Darlin Coco, MD    Family History Family History  Problem Relation Age of Onset  . Heart disease Mother   . Hypertension Mother   . Diabetes Father   . Heart disease Father   . Pancreatic cancer Sister   . Brain cancer Brother   . Heart attack Sister   . Uterine cancer Sister   . Colon cancer Sister   . Pancreatic cancer Brother     Social History Social History  Substance Use Topics  . Smoking status: Never Smoker  . Smokeless tobacco: Never Used  . Alcohol use No     Allergies   Ciprofloxacin; Codeine; Eszopiclone; Morphine; Morphine and related;  Oxycodone-acetaminophen; Penicillins; and Sulfa antibiotics   Review of Systems Review of Systems  Constitutional: Positive for diaphoresis. Negative for fever.  HENT: Negative for congestion, rhinorrhea and sore throat.   Eyes: Negative for visual disturbance.  Respiratory: Negative for cough and shortness of breath.   Cardiovascular: Negative for chest pain.  Gastrointestinal: Positive for abdominal pain, diarrhea and nausea. Negative for blood in stool and vomiting.  Genitourinary: Negative for dysuria and hematuria.  Musculoskeletal: Negative  for back pain and neck pain.  Skin: Negative for rash.  Neurological: Negative for dizziness, weakness, numbness and headaches.  Psychiatric/Behavioral: Negative for confusion.  All other systems reviewed and are negative.    Physical Exam Updated Vital Signs BP 115/70 (BP Location: Left Arm)   Pulse 93   Temp 98.4 F (36.9 C) (Oral)   Resp 18   Ht 5' (1.524 m)   Wt 65.7 kg   LMP 06/29/1976   SpO2 100%   BMI 28.30 kg/m   Physical Exam  Constitutional: She is oriented to person, place, and time. She appears well-developed and well-nourished.  Sitting comfortably in examination table.  HENT:  Head: Normocephalic and atraumatic.  Mouth/Throat: Oropharynx is clear and moist. Mucous membranes are dry. No posterior oropharyngeal edema or posterior oropharyngeal erythema.  Eyes: Conjunctivae, EOM and lids are normal. Pupils are equal, round, and reactive to light.  Neck: Full passive range of motion without pain.  Cardiovascular: Normal rate, regular rhythm, normal heart sounds and normal pulses.  Exam reveals no gallop and no friction rub.   No murmur heard. Pulmonary/Chest: Effort normal and breath sounds normal.  Abdominal: Soft. Normal appearance and bowel sounds are normal. She exhibits no distension. There is generalized tenderness. There is no rigidity and no guarding.  Patient has some generalized tenderness to palpation but no  focal tenderness. No peritoneal signs.  Musculoskeletal: Normal range of motion.  Neurological: She is alert and oriented to person, place, and time.  Skin: Skin is warm and dry. Capillary refill takes less than 2 seconds.  Psychiatric: She has a normal mood and affect. Her speech is normal.  Nursing note and vitals reviewed.    ED Treatments / Results  Labs (all labs ordered are listed, but only abnormal results are displayed) Labs Reviewed  COMPREHENSIVE METABOLIC PANEL - Abnormal; Notable for the following:       Result Value   Sodium 133 (*)    CO2 19 (*)    Glucose, Bld 117 (*)    GFR calc non Af Amer 50 (*)    GFR calc Af Amer 58 (*)    All other components within normal limits  URINALYSIS, ROUTINE W REFLEX MICROSCOPIC - Abnormal; Notable for the following:    APPearance CLOUDY (*)    Ketones, ur 20 (*)    Protein, ur 30 (*)    Leukocytes, UA LARGE (*)    All other components within normal limits  CBC WITH DIFFERENTIAL/PLATELET - Abnormal; Notable for the following:    WBC 13.2 (*)    Hemoglobin 15.6 (*)    HCT 46.2 (*)    Neutro Abs 8.5 (*)    Monocytes Absolute 1.1 (*)    All other components within normal limits  TROPONIN I - Abnormal; Notable for the following:    Troponin I 0.03 (*)    All other components within normal limits  POC OCCULT BLOOD, ED - Abnormal; Notable for the following:    Fecal Occult Bld POSITIVE (*)    All other components within normal limits  GASTROINTESTINAL PANEL BY PCR, STOOL (REPLACES STOOL CULTURE)  C DIFFICILE QUICK SCREEN W PCR REFLEX  URINE CULTURE  LIPASE, BLOOD  LACTIC ACID, PLASMA  TROPONIN I  TROPONIN I  BRAIN NATRIURETIC PEPTIDE  BASIC METABOLIC PANEL  CBC    EKG  EKG Interpretation  Date/Time:  Sunday October 25 2016 12:15:08 EDT Ventricular Rate:  81 PR Interval:    QRS Duration: 91 QT Interval:  380 QTC Calculation: 442 R Axis:   3 Text Interpretation:  Sinus rhythm When compared with ECG of 08/15/2016 No  significant change was found Confirmed by [facility]  MD, Nunzio Cory [phone]) on 10/25/2016 12:31:34 PM       Radiology Ct Abdomen Pelvis Wo Contrast  Result Date: 10/25/2016 CLINICAL DATA:  Epigastric toe lower abdominal pain beginning today with some diarrhea and nausea. History of kidney disease. History of breast carcinoma. EXAM: CT ABDOMEN AND PELVIS WITHOUT CONTRAST TECHNIQUE: Multidetector CT imaging of the abdomen and pelvis was performed following the standard protocol without IV contrast. COMPARISON:  None. FINDINGS: Lower chest: Lung base subsegmental atelectasis. Heart is normal in size. Dense coronary artery calcifications. No acute findings at the lung bases. Hepatobiliary: No focal liver abnormality is seen. Status post cholecystectomy. No biliary dilatation. Pancreas: Unremarkable. No pancreatic ductal dilatation or surrounding inflammatory changes. Spleen: Normal in size without focal abnormality. Adrenals/Urinary Tract: Severe left renal atrophy. Low-density mass consistent with a cyst arises from the left kidney upper pole measuring 12 mm. Small nonobstructing stone in the lower pole the left kidney. No left hydronephrosis. Normal left ureter. Mild prominence of the right intrarenal collecting system. Right ureter is normal course and in caliber with no stones. No right renal masses or intrarenal stones. Bladder is unremarkable. No adrenal masses. Stomach/Bowel: Stomach and small bowel unremarkable. There are left colon diverticula. No diverticulitis. Colon shows air-fluid levels. No wall thickening or inflammation. Vascular/Lymphatic: Aortic atherosclerosis. No enlarged abdominal or pelvic lymph nodes. Reproductive: Status post hysterectomy. No adnexal masses. Other: No abdominal wall hernia or abnormality. No abdominopelvic ascites. Musculoskeletal: No fracture or acute finding. No osteoblastic or osteolytic lesions. IMPRESSION: 1. Air-fluid levels in the colon consistent with the history of  diarrhea. No colonic wall thickening or inflammation. The findings are consistent with gastroenteritis. 2. No other evidence of an acute abnormality. 3. Colonic diverticular with no evidence of diverticulitis. 4. Status post cholecystectomy and hysterectomy. 5. Severe left renal atrophy. Small left renal cyst and intrarenal stone. 6. Aortic atherosclerosis. Electronically Signed   By: [name] M.D.   On: 10/25/2016 15:06   Dg Chest 2 View  Result Date: 10/25/2016 CLINICAL DATA:  Epigastric pain and diarrhea.  Hypotension EXAM: CHEST  2 VIEW COMPARISON:  August 14, 2016 FINDINGS: There is atelectatic change in the left base. Lungs elsewhere are clear. Heart size and pulmonary vascularity are normal. Aorta is mildly ectatic with aortic atherosclerosis. There is degenerative change in the thoracic spine. No evident adenopathy. IMPRESSION: Left base atelectasis. No edema or consolidation. Aortic atherosclerosis. Heart size within normal limits. Electronically Signed   By: Lowella Grip III M.D.   On: 10/25/2016 13:22    Procedures Procedures (including critical care time)  Medications Ordered in ED Medications  carvedilol (COREG) tablet 6.25 mg (6.25 mg Oral Given 10/25/16 1728)  multivitamin with minerals tablet 1 tablet (not administered)  lisinopril (PRINIVIL,ZESTRIL) tablet 10 mg (not administered)  cycloSPORINE (RESTASIS) 0.05 % ophthalmic emulsion 1 drop (not administered)  ezetimibe (ZETIA) tablet 10 mg (not administered)  hydrALAZINE (APRESOLINE) injection 10 mg (not administered)  aspirin tablet 325 mg (325 mg Oral Given 10/25/16 1728)  0.9 %  sodium chloride infusion ( Intravenous New Bag/Given 10/25/16 1604)  acetaminophen (TYLENOL) tablet 650 mg (not administered)    Or  acetaminophen (TYLENOL) suppository 650 mg (not administered)  ondansetron (ZOFRAN) tablet 4 mg (not administered)    Or  ondansetron (ZOFRAN) injection 4 mg (not administered)  sodium chloride 0.9 %  bolus  1,000 mL (0 mLs Intravenous Stopped 10/25/16 1555)     Initial Impression / Assessment and Plan / ED Course  I have reviewed the triage vital signs and the nursing notes.  Pertinent labs & imaging results that were available during my care of the patient were reviewed by me and considered in my medical decision making (see chart for details).    A 83 year old female who presents today with abdominal pain and diarrhea that started this morning. Patient states that pain is crampy and does not radiate. She has had multiple episodes of loose, watery stools since arriving in the ED. Physical exam with some generalized tenderness to palpation of the abdomen but no focal tenderness. On ED arrival she had 3 episodes of watery, loose stools with intense abdominal cramping. During interview, she states that pain has improved to a 1/10. Patient is afebrile, non-toxic appearing, sitting comfortably on examination table.Consider acute infectious etiology versus C. difficile, the low suspicion given lack of recent antibiotics. Also consider ACS given age and risk factors. History/physical exam are not concerning for mesenteric ischemia. Though will check lactic acid to evaluate. Will also check basic labs including CBC, CMP, UA, troponin, lactic acid, EKG, CT abdomen and pelvis. Also plan to check gastro stool panel and eval for fecal occult blood. Patient is positive orthostatic hypotension. IVF started for fluid resuscitation. Offered patient antiemetics and analgesics but patient declines at this time.  Labs reviewed. UTI significant for leukocytes and white blood cell. CBC with slightly elevated white count of 13.2. CMP shows decreased GFR but patient states she has a standing history of kidney disease. BUN/creatinine are stable. Troponin is positive. Will plan to admit. Discussed results with patient and explained that she will likely be admitted for further evaluation. Patient has remained febrile in the  department.  Discussed with Dr. Aggie Moats. Will admit patient for evaluation of dehydration, urinary tract infection. Patient updated plan. Patient still denying any pain or nausea and does not wish to have any pain medication.  Final Clinical Impressions(s) / ED Diagnoses   Final diagnoses:  Acute cystitis without hematuria  Dehydration  Diarrhea, unspecified type    New Prescriptions Current Discharge Medication List       Volanda Napoleon, PA-C 10/25/16 [place], DO 10/28/16 1952

## 2016-10-25 NOTE — ED Notes (Signed)
Per [facility], pt from home, called out for epigastric pain and diarrhea, pt has had four loose stools already today, pt placed on bedside commode to sample. Pt states the pain comes rioght before she has a bowel movement. Pt also c/o being weak.

## 2016-10-25 NOTE — ED Notes (Signed)
Dr. Thurnell Garbe notified of pt troponin

## 2016-10-25 NOTE — ED Notes (Signed)
Pt in CT.

## 2016-10-25 NOTE — H&P (Signed)
[facility] History and Physical  Shannon Serrano NIO:[id number] DOB: [birth year] DOA: 10/25/2016  Referring physician:  PCP: Mathews Argyle, MD   Chief Complaint: "I felt like something was wrong."  HPI: Shannon Serrano is a 83 y.o. female with past mental history of breast cancer, DVT, hearing loss, myalgia, high blood pressure, stroke and recent diagnosis of urine tract infection resistant the hospital but chief complaint of diarrhea and weakness. Patient states she has long history of on again off again diarrhea that usually last week. Patient came alarmed this morning because she had intense abdominal pain which is atypical. Patient got very nauseous and diaphoretic. Patient denies any bloody or tarry stools. Patient denies any recent antibiotic use. Of note patient recently diagnosed with UTI.     Review of Systems:  As per HPI otherwise 10 point review of systems negative.    Past Medical History:  Diagnosis Date  . Clavicle fracture 04/2004  . 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 Diverticulosis   . DVT (deep venous thrombosis) ([place]) 11/2007   Left leg- on lovenox x 1 injection only   . Global amnesia 11/2006  . Hearing loss   . History of fibromyalgia   . History of hysterectomy    History of hysterectomy for fibroids She still has her ovaries  . Hyperlipidemia    History of hyperlipidemia  . Hypertension   . Kidney disease   . S/P radiation therapy 07/22/10 - 08/11/10   Whole Left Breast/42.56 Gy/16 Fradctions  . 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 Skin cancer L Lower Leg  . Skin cancer, basal cell    leg  . Stroke ([place]) 12/31/2007   mini stroke / has had several   . Syncope    Past Surgical History:  Procedure Laterality Date  . APPENDECTOMY  1948  . BREAST LUMPECTOMY Left 06/18/10    LEFT BREAST -HIGHG RADE DUCTAL CARCINOMA INSITU, ER+, PR+,  . DOPPLER ECHOCARDIOGRAPHY  05/20/2007   EF 55-60%  . eye  surgery  08;09   excessive oil film removed from eyes  . INTRACAPSULAR CATARACT EXTRACTION Bilateral    She also had cataract surgery on the right eye  . LAPAROSCOPIC CHOLECYSTECTOMY  2001; 2004  . LEFT HEART CATH AND CORONARY ANGIOGRAPHY N/A 08/14/2016   Procedure: Left Heart Cath and Coronary Angiography;  Surgeon: Nelva Bush, MD;  Location: [facility];  Service: Cardiovascular;  Laterality: N/A;  . VAGINAL HYSTERECTOMY  1978   fibroids   Social History:  reports that she has never smoked. She has never used smokeless tobacco. She reports that she does not drink alcohol or use drugs.  Allergies  Allergen Reactions  . Ciprofloxacin     Unable to take due to repeated use for uti  . Codeine Other (See Comments)    Unknown   . Eszopiclone Other (See Comments)    Unknown   . Morphine Other (See Comments)    Unknown   . Morphine And Related Nausea And Vomiting  . Oxycodone-Acetaminophen Other (See Comments)    Unknown   . Penicillins Nausea And Vomiting       . Sulfa Antibiotics Hives    Family History  Problem Relation Age of Onset  . Heart disease Mother   . Hypertension Mother   . Diabetes Father   . Heart disease Father   . Pancreatic cancer Sister   . Brain cancer Brother   .  Heart attack Sister   . Uterine cancer Sister   . Colon cancer Sister   . Pancreatic cancer Brother      Prior to Admission medications   Medication Sig Start Date End Date Taking? Authorizing Provider  aspirin 81 MG tablet Take 81 mg by mouth daily.      Historical Provider, MD  B Complex Vitamins (B COMPLEX PO) Take 1 Dose by mouth daily.     Historical Provider, MD  Calcium Carb-Cholecalciferol (CALCIUM 1000 + D PO) Take 1,200 tablets by mouth daily.      Historical Provider, MD  carvedilol (COREG) 6.25 MG tablet Take 1 tablet (6.25 mg total) by mouth 2 (two) times daily with a meal. 08/16/16   Robbie Lis, MD  Cholecalciferol (VITAMIN D PO) Take 1 Dose by mouth daily.      Historical Provider, MD  co-enzyme Q-10 30 MG capsule Take 30 mg by mouth daily.     Historical Provider, MD  cyclobenzaprine (FLEXERIL) 10 MG tablet TAKE 1 TABLET BY MOUTH THREE TIMES DAILY AS NEEDED FOR MUSCLE SPASMS Patient taking differently: TAKE 1 TABLET BY MOUTH EVERY EVENING AS NEEDED FOR MUSCLE SPASMS 03/07/13   Darlin Coco, MD  lisinopril (PRINIVIL,ZESTRIL) 20 MG tablet TAKE 1/2 TABLET=10MG  BY MOUTH TWICE DAILY 03/26/16   Historical Provider, MD  magnesium 30 MG tablet Take 30 mg by mouth at bedtime.    Historical Provider, MD  Multiple Vitamin (MULTIVITAMIN WITH MINERALS) TABS tablet Take 1 tablet by mouth daily.    Historical Provider, MD  RESTASIS 0.05 % ophthalmic emulsion INSTILL 1 DROP INTO BOTH EYES EVERY EVENING 01/31/16   Historical Provider, MD  valACYclovir (VALTREX) 500 MG tablet Take 1 tablet (500 mg total) by mouth 2 (two) times daily. X 3 days only at onset of outbreak 10/15/15   Regina Eck, CNM  ZETIA 10 MG tablet TAKE 1 TABLET BY MOUTH DAILY 03/16/13   Darlin Coco, MD   Physical Exam: Vitals:   10/25/16 1300 10/25/16 1345 10/25/16 1400 10/25/16 1430  BP: 97/69 138/80 140/88 138/77  Pulse: 70 76 82 88  Resp: 14 (!) 21 13 17   Temp:      SpO2: 99% 100% 100% 100%    Wt Readings from Last 3 Encounters:  08/16/16 66.9 kg (147 lb 6.4 oz)  04/02/16 65.3 kg (144 lb)  02/25/16 65.8 kg (145 lb)    General:  Appears calm and comfortable, Alert and oriented 3, talkative Eyes:  PERRL, EOMI, normal lids, iris ENT:  grossly normal hearing, lips & tongue Neck:  no LAD, masses or thyromegaly Cardiovascular:  RRR, no m/r/g. No LE edema.  Respiratory:  CTA bilaterally, no w/r/r. Normal respiratory effort. Abdomen:  soft, ntnd, no CVA tenderness Skin:  no rash or induration seen on limited exam Musculoskeletal:  grossly normal tone BUE/BLE Psychiatric:  grossly normal mood and affect, speech fluent and appropriate Neurologic:  CN 2-12 grossly intact, moves all  extremities in coordinated fashion.          Labs on Admission:  Basic Metabolic Panel:  Recent Labs Lab 10/25/16 1245  NA 133*  K 4.5  CL 101  CO2 19*  GLUCOSE 117*  BUN 19  CREATININE 1.00  CALCIUM 9.8   Liver Function Tests:  Recent Labs Lab 10/25/16 1245  AST 37  ALT 23  ALKPHOS 60  BILITOT 1.1  PROT 7.3  ALBUMIN 4.2    Recent Labs Lab 10/25/16 1245  LIPASE 43   No  results for input(s): AMMONIA in the last 168 hours. CBC:  Recent Labs Lab 10/25/16 1245  WBC 13.2*  NEUTROABS 8.5*  HGB 15.6*  HCT 46.2*  MCV 94.7  PLT 206   Cardiac Enzymes:  Recent Labs Lab 10/25/16 1245  TROPONINI 0.03*    BNP (last 3 results)  Recent Labs  10/30/15 1455 08/14/16 1828  BNP 38.2 43.1    ProBNP (last 3 results) No results for input(s): PROBNP in the last 8760 hours.   Creatinine clearance cannot be calculated (Unknown ideal weight.)  CBG: No results for input(s): GLUCAP in the last 168 hours.  Radiological Exams on Admission: Ct Abdomen Pelvis Wo Contrast  Result Date: 10/25/2016 CLINICAL DATA:  Epigastric toe lower abdominal pain beginning today with some diarrhea and nausea. History of kidney disease. History of breast carcinoma. EXAM: CT ABDOMEN AND PELVIS WITHOUT CONTRAST TECHNIQUE: Multidetector CT imaging of the abdomen and pelvis was performed following the standard protocol without IV contrast. COMPARISON:  None. FINDINGS: Lower chest: Lung base subsegmental atelectasis. Heart is normal in size. Dense coronary artery calcifications. No acute findings at the lung bases. Hepatobiliary: No focal liver abnormality is seen. Status post cholecystectomy. No biliary dilatation. Pancreas: Unremarkable. No pancreatic ductal dilatation or surrounding inflammatory changes. Spleen: Normal in size without focal abnormality. Adrenals/Urinary Tract: Severe left renal atrophy. Low-density mass consistent with a cyst arises from the left kidney upper pole measuring  12 mm. Small nonobstructing stone in the lower pole the left kidney. No left hydronephrosis. Normal left ureter. Mild prominence of the right intrarenal collecting system. Right ureter is normal course and in caliber with no stones. No right renal masses or intrarenal stones. Bladder is unremarkable. No adrenal masses. Stomach/Bowel: Stomach and small bowel unremarkable. There are left colon diverticula. No diverticulitis. Colon shows air-fluid levels. No wall thickening or inflammation. Vascular/Lymphatic: Aortic atherosclerosis. No enlarged abdominal or pelvic lymph nodes. Reproductive: Status post hysterectomy. No adnexal masses. Other: No abdominal wall hernia or abnormality. No abdominopelvic ascites. Musculoskeletal: No fracture or acute finding. No osteoblastic or osteolytic lesions. IMPRESSION: 1. Air-fluid levels in the colon consistent with the history of diarrhea. No colonic wall thickening or inflammation. The findings are consistent with gastroenteritis. 2. No other evidence of an acute abnormality. 3. Colonic diverticular with no evidence of diverticulitis. 4. Status post cholecystectomy and hysterectomy. 5. Severe left renal atrophy. Small left renal cyst and intrarenal stone. 6. Aortic atherosclerosis. Electronically Signed   By: [name] M.D.   On: 10/25/2016 15:06   Dg Chest 2 View  Result Date: 10/25/2016 CLINICAL DATA:  Epigastric pain and diarrhea.  Hypotension EXAM: CHEST  2 VIEW COMPARISON:  August 14, 2016 FINDINGS: There is atelectatic change in the left base. Lungs elsewhere are clear. Heart size and pulmonary vascularity are normal. Aorta is mildly ectatic with aortic atherosclerosis. There is degenerative change in the thoracic spine. No evident adenopathy. IMPRESSION: Left base atelectasis. No edema or consolidation. Aortic atherosclerosis. Heart size within normal limits. Electronically Signed   By: Lowella Grip III M.D.   On: 10/25/2016 13:22    EKG: Independently  reviewed. No STEMI  Assessment/Plan Active Problems:   Dehydration  Diarrheaa 2/2 gastroeneteitis Patient reports this is chronic She appears dehydrated Will rehydrate her overnight gently CT reported this is gastroenteritis and will leave enteric precautions in case this is some other pathogen  UTI Vancomycin Reviewed culture results and carefully where Patient will need PIC line placement tomorrow per recommendations from her urologist Will need  case management/social work consult for outpatient vancomycin  CKD 3 Monitor Cr daily Cr at baseline,  1.00  Hypertension When necessary hydralazine 10 mg IV as needed for severe blood pressure Cont coreg, lisinopril  Glaucoma Cont restasis  HLD Cont Zetia  Code Status: FULL  DVT Prophylaxis: SCDs Family Communication: none at bedside Disposition Plan: Pending Improvement  Status: obs tele  Elwin Mocha, MD Family Medicine [facility] [email or web address] Password [email or web address]

## 2016-10-25 NOTE — ED Notes (Signed)
Pt to CT, xray

## 2016-10-26 DIAGNOSIS — B952 Enterococcus as the cause of diseases classified elsewhere: Secondary | ICD-10-CM | POA: Diagnosis present

## 2016-10-26 DIAGNOSIS — Z853 Personal history of malignant neoplasm of breast: Secondary | ICD-10-CM | POA: Diagnosis not present

## 2016-10-26 DIAGNOSIS — Z8673 Personal history of transient ischemic attack (TIA), and cerebral infarction without residual deficits: Secondary | ICD-10-CM | POA: Diagnosis not present

## 2016-10-26 DIAGNOSIS — R748 Abnormal levels of other serum enzymes: Secondary | ICD-10-CM | POA: Diagnosis present

## 2016-10-26 DIAGNOSIS — N183 Chronic kidney disease, stage 3 (moderate): Secondary | ICD-10-CM | POA: Diagnosis present

## 2016-10-26 DIAGNOSIS — Z9049 Acquired absence of other specified parts of digestive tract: Secondary | ICD-10-CM | POA: Diagnosis not present

## 2016-10-26 DIAGNOSIS — Z79899 Other long term (current) drug therapy: Secondary | ICD-10-CM | POA: Diagnosis not present

## 2016-10-26 DIAGNOSIS — E860 Dehydration: Secondary | ICD-10-CM | POA: Diagnosis present

## 2016-10-26 DIAGNOSIS — Z8249 Family history of ischemic heart disease and other diseases of the circulatory system: Secondary | ICD-10-CM | POA: Diagnosis not present

## 2016-10-26 DIAGNOSIS — M5430 Sciatica, unspecified side: Secondary | ICD-10-CM | POA: Diagnosis present

## 2016-10-26 DIAGNOSIS — K529 Noninfective gastroenteritis and colitis, unspecified: Secondary | ICD-10-CM | POA: Diagnosis not present

## 2016-10-26 DIAGNOSIS — H409 Unspecified glaucoma: Secondary | ICD-10-CM | POA: Diagnosis present

## 2016-10-26 DIAGNOSIS — Z800 Family history of malignant neoplasm of digestive organs: Secondary | ICD-10-CM | POA: Diagnosis not present

## 2016-10-26 DIAGNOSIS — M797 Fibromyalgia: Secondary | ICD-10-CM | POA: Diagnosis present

## 2016-10-26 DIAGNOSIS — Z86718 Personal history of other venous thrombosis and embolism: Secondary | ICD-10-CM | POA: Diagnosis not present

## 2016-10-26 DIAGNOSIS — Z833 Family history of diabetes mellitus: Secondary | ICD-10-CM | POA: Diagnosis not present

## 2016-10-26 DIAGNOSIS — I129 Hypertensive chronic kidney disease with stage 1 through stage 4 chronic kidney disease, or unspecified chronic kidney disease: Secondary | ICD-10-CM | POA: Diagnosis present

## 2016-10-26 DIAGNOSIS — I2510 Atherosclerotic heart disease of native coronary artery without angina pectoris: Secondary | ICD-10-CM | POA: Diagnosis present

## 2016-10-26 DIAGNOSIS — N3000 Acute cystitis without hematuria: Secondary | ICD-10-CM | POA: Diagnosis present

## 2016-10-26 DIAGNOSIS — Z85828 Personal history of other malignant neoplasm of skin: Secondary | ICD-10-CM | POA: Diagnosis not present

## 2016-10-26 DIAGNOSIS — Z923 Personal history of irradiation: Secondary | ICD-10-CM | POA: Diagnosis not present

## 2016-10-26 DIAGNOSIS — E785 Hyperlipidemia, unspecified: Secondary | ICD-10-CM | POA: Diagnosis present

## 2016-10-26 DIAGNOSIS — Z90710 Acquired absence of both cervix and uterus: Secondary | ICD-10-CM | POA: Diagnosis not present

## 2016-10-26 LAB — BASIC METABOLIC PANEL
Anion gap: 6 (ref 5–15)
BUN: 14 mg/dL (ref 6–20)
CO2: 25 mmol/L (ref 22–32)
Calcium: 8.6 mg/dL — ABNORMAL LOW (ref 8.9–10.3)
Chloride: 107 mmol/L (ref 101–111)
Creatinine, Ser: 0.87 mg/dL (ref 0.44–1.00)
GFR calc Af Amer: >60 mL/min (ref >60)
GFR calc non Af Amer: 59 mL/min — ABNORMAL LOW (ref >60)
Glucose, Bld: 90 mg/dL (ref 65–99)
Potassium: 4.5 mmol/L (ref 3.5–5.1)
Sodium: 138 mmol/L (ref 135–145)

## 2016-10-26 LAB — CBC
HCT: 35 % — ABNORMAL LOW (ref 36.0–46.0)
Hemoglobin: 11.6 g/dL — ABNORMAL LOW (ref 12.0–15.0)
MCH: 31 pg (ref 26.0–34.0)
MCHC: 33.1 g/dL (ref 30.0–36.0)
MCV: 93.6 fL (ref 78.0–100.0)
Platelets: 191 10*3/uL (ref 150–400)
RBC: 3.74 MIL/uL — ABNORMAL LOW (ref 3.87–5.11)
RDW: 14.5 % (ref 11.5–15.5)
WBC: 7.6 10*3/uL (ref 4.0–10.5)

## 2016-10-26 LAB — TROPONIN I: Troponin I: <0.03 ng/mL (ref <0.03)

## 2016-10-26 MED ORDER — LISINOPRIL 20 MG PO TABS
20.0000 mg | ORAL_TABLET | Freq: Two times a day (BID) | ORAL | Status: DC
  Administered 2016-10-26 – 2016-10-27 (×2): 20 mg via ORAL
  Filled 2016-10-26 (×2): qty 1

## 2016-10-26 MED ORDER — NITROFURANTOIN MONOHYD MACRO 100 MG PO CAPS
100.0000 mg | ORAL_CAPSULE | Freq: Two times a day (BID) | ORAL | Status: DC
  Administered 2016-10-26: 100 mg via ORAL
  Filled 2016-10-26: qty 1

## 2016-10-26 MED ORDER — AMPICILLIN 500 MG PO CAPS
500.0000 mg | ORAL_CAPSULE | Freq: Two times a day (BID) | ORAL | Status: DC
  Administered 2016-10-26 – 2016-10-27 (×2): 500 mg via ORAL
  Filled 2016-10-26 (×3): qty 1

## 2016-10-26 MED ORDER — SODIUM CHLORIDE 0.9% FLUSH: 10.0000 mL | INTRAVENOUS | Status: DC | PRN

## 2016-10-26 NOTE — Care Management Note (Signed)
Case Management Note  Patient Details  Name: Shannon Serrano MRN: [id number] Date of Birth: 09-26-1928  Subjective/Objective:        CM following for progression and d/c planning.             Action/Plan: 10/26/2016 Noted plan to d/c this pt to home with IV antibiotics. Pt selected [facility] for [facility] and IV antibiotics. [facility] , P [facility] RN notified and will see pt this pm to begin instructions.   Expected Discharge Date:  10/28/16               Expected Discharge Plan:  [name]  In-House Referral:  NA  Discharge planning Services  CM Consult  Post Acute Care Choice:  Home Health, Durable Medical Equipment Choice offered to:  Patient  DME Arranged:  IV pump/equipment DME Agency:  [place]:  RN [facility] Agency:  [place]  Status of Service:  Completed, signed off  If discussed at [place] of Stay Meetings, dates discussed:    Additional Comments:  Adron Bene, RN 10/26/2016, 3:03 PM

## 2016-10-26 NOTE — Progress Notes (Signed)
PROGRESS NOTE    Shannon Serrano  HFW:[id number] DOB: Jul 31, 1928 DOA: 10/25/2016 PCP: Mathews Argyle, MD  Outpatient Specialists:     Brief Narrative:  52  htn ckd stg III Breast cancer-hi grade DCIS s/p XRT Prior cva 2008 [tia's] Sciatica foll by ortho Prior CAD s/p CAth 08/14/16=mod non obs LAD disease  Recent diagnosed with UTI RX Presented to [facility] with diarrhea-REhydrated On admit wbc 13, no aki Ct done cofnrimed gastro   Assessment & Plan:   Principal Problem:   Gastroenteritis Active Problems:   Dehydration   CKD (chronic kidney disease), stage III   Elevated troponin   Diarrhea-iatrogenic after taking magnesium D/c contact precautions  Enterococcus UTI Apparently only sensitive to Vancomycin Called Dr. Eldridge Dace at [facility] confirmed that sensitivities show S to Vancomycin Will probably need PICC line placement  Br ca Hi grade DCIS s/p XRT-stable OP follow up with Oncology  Prior TIA CVA Cont asa 325  CAd with recent Cath no osb disease Cont Coreg 6.25 bid Lisinopril 10 bid   lovenox Inpatient  Needs clearing of diarrhea and picc line   Consultants:   none  Procedures:   none  Antimicrobials:   Vancomycin     Subjective: Awake alert in nad Eating and drinking' Had spasm and diarrea after magnesium  No fever no chills no n/v  Objective: Vitals:   10/25/16 1658 10/25/16 1720 10/25/16 2137 10/26/16 0509  BP:  115/70 (!) 114/50 (!) 150/65  Pulse:  93 80 85  Resp:  18 18 18   Temp:  98.4 F (36.9 C) 98 F (36.7 C) 98.4 F (36.9 C)  TempSrc:  Oral Oral Oral  SpO2:  100% 98% 99%  Weight: 65.7 kg (144 lb 14.4 oz)  65.7 kg (144 lb 13.5 oz)   Height: 5' (1.524 m)       Intake/Output Summary (Last 24 hours) at 10/26/16 0739 Last data filed at 10/26/16 0600  Gross per 24 hour  Intake          2633.33 ml  Output              425 ml  Net          2208.33 ml   Filed Weights   10/25/16 1658 10/25/16 2137  Weight: 65.7 kg (144 lb  14.4 oz) 65.7 kg (144 lb 13.5 oz)    Examination:  eomi pleasant  No pallor no ict cta b s1s 2 no m, no jvd abd soft nt nd no rebound Neuro intact moves 4 limbs    Data Reviewed: I have personally reviewed following labs and imaging studies  CBC:  Recent Labs Lab 10/25/16 1245 10/26/16 0432  WBC 13.2* 7.6  NEUTROABS 8.5*  --   HGB 15.6* 11.6*  HCT 46.2* 35.0*  MCV 94.7 93.6  PLT 206 027   Basic Metabolic Panel:  Recent Labs Lab 10/25/16 1245 10/26/16 0432  NA 133* 138  K 4.5 4.5  CL 101 107  CO2 19* 25  GLUCOSE 117* 90  BUN 19 14  CREATININE 1.00 0.87  CALCIUM 9.8 8.6*   GFR: Estimated Creatinine Clearance: 40 mL/min (by C-G formula based on SCr of 0.87 mg/dL). Liver Function Tests:  Recent Labs Lab 10/25/16 1245  AST 37  ALT 23  ALKPHOS 60  BILITOT 1.1  PROT 7.3  ALBUMIN 4.2    Recent Labs Lab 10/25/16 1245  LIPASE 43   No results for input(s): AMMONIA in the last 168 hours. Coagulation Profile:  No results for input(s): INR, PROTIME in the last 168 hours. Cardiac Enzymes:  Recent Labs Lab 10/25/16 1245 10/25/16 2125 10/26/16 0432  TROPONINI 0.03* <0.03 <0.03   BNP (last 3 results) No results for input(s): PROBNP in the last 8760 hours. HbA1C: No results for input(s): HGBA1C in the last 72 hours. CBG: No results for input(s): GLUCAP in the last 168 hours. Lipid Profile: No results for input(s): CHOL, HDL, LDLCALC, TRIG, CHOLHDL, LDLDIRECT in the last 72 hours. Thyroid Function Tests: No results for input(s): TSH, T4TOTAL, FREET4, T3FREE, THYROIDAB in the last 72 hours. Anemia Panel: No results for input(s): VITAMINB12, FOLATE, FERRITIN, TIBC, IRON, RETICCTPCT in the last 72 hours. Urine analysis:    Component Value Date/Time   COLORURINE YELLOW 10/25/2016 1252   APPEARANCEUR CLOUDY (A) 10/25/2016 1252   LABSPEC 1.016 10/25/2016 1252   PHURINE 5.0 10/25/2016 1252   GLUCOSEU NEGATIVE 10/25/2016 1252   HGBUR NEGATIVE  10/25/2016 1252   BILIRUBINUR NEGATIVE 10/25/2016 1252   KETONESUR 20 (A) 10/25/2016 1252   PROTEINUR 30 (A) 10/25/2016 1252   UROBILINOGEN 0.2 06/18/2010 0925   NITRITE NEGATIVE 10/25/2016 1252   LEUKOCYTESUR LARGE (A) 10/25/2016 1252   Sepsis Labs: @LABRCNTIP (procalcitonin:4,lacticidven:4)  ) Recent Results (from the past 240 hour(s))  Gastrointestinal Panel by PCR , Stool     Status: None   Collection Time: 10/25/16 12:39 PM  Result Value Ref Range Status   Campylobacter species NOT DETECTED NOT DETECTED Final   Plesimonas shigelloides NOT DETECTED NOT DETECTED Final   Salmonella species NOT DETECTED NOT DETECTED Final   Yersinia enterocolitica NOT DETECTED NOT DETECTED Final   Vibrio species NOT DETECTED NOT DETECTED Final   Vibrio cholerae NOT DETECTED NOT DETECTED Final   Enteroaggregative E coli (EAEC) NOT DETECTED NOT DETECTED Final   Enteropathogenic E coli (EPEC) NOT DETECTED NOT DETECTED Final   Enterotoxigenic E coli (ETEC) NOT DETECTED NOT DETECTED Final   Shiga like toxin producing E coli (STEC) NOT DETECTED NOT DETECTED Final   Shigella/Enteroinvasive E coli (EIEC) NOT DETECTED NOT DETECTED Final   Cryptosporidium NOT DETECTED NOT DETECTED Final   Cyclospora cayetanensis NOT DETECTED NOT DETECTED Final   Entamoeba histolytica NOT DETECTED NOT DETECTED Final   Giardia lamblia NOT DETECTED NOT DETECTED Final   Adenovirus F40/41 NOT DETECTED NOT DETECTED Final   Astrovirus NOT DETECTED NOT DETECTED Final   Norovirus GI/GII NOT DETECTED NOT DETECTED Final   Rotavirus A NOT DETECTED NOT DETECTED Final   Sapovirus (I, II, IV, and V) NOT DETECTED NOT DETECTED Final  C difficile quick scan w PCR reflex     Status: None   Collection Time: 10/25/16 12:39 PM  Result Value Ref Range Status   C Diff antigen NEGATIVE NEGATIVE Final   C Diff toxin NEGATIVE NEGATIVE Final   C Diff interpretation No C. difficile detected.  Final         Radiology Studies: Ct Abdomen  Pelvis Wo Contrast  Result Date: 10/25/2016 CLINICAL DATA:  Epigastric toe lower abdominal pain beginning today with some diarrhea and nausea. History of kidney disease. History of breast carcinoma. EXAM: CT ABDOMEN AND PELVIS WITHOUT CONTRAST TECHNIQUE: Multidetector CT imaging of the abdomen and pelvis was performed following the standard protocol without IV contrast. COMPARISON:  None. FINDINGS: Lower chest: Lung base subsegmental atelectasis. Heart is normal in size. Dense coronary artery calcifications. No acute findings at the lung bases. Hepatobiliary: No focal liver abnormality is seen. Status post cholecystectomy. No biliary dilatation. Pancreas: Unremarkable.  No pancreatic ductal dilatation or surrounding inflammatory changes. Spleen: Normal in size without focal abnormality. Adrenals/Urinary Tract: Severe left renal atrophy. Low-density mass consistent with a cyst arises from the left kidney upper pole measuring 12 mm. Small nonobstructing stone in the lower pole the left kidney. No left hydronephrosis. Normal left ureter. Mild prominence of the right intrarenal collecting system. Right ureter is normal course and in caliber with no stones. No right renal masses or intrarenal stones. Bladder is unremarkable. No adrenal masses. Stomach/Bowel: Stomach and small bowel unremarkable. There are left colon diverticula. No diverticulitis. Colon shows air-fluid levels. No wall thickening or inflammation. Vascular/Lymphatic: Aortic atherosclerosis. No enlarged abdominal or pelvic lymph nodes. Reproductive: Status post hysterectomy. No adnexal masses. Other: No abdominal wall hernia or abnormality. No abdominopelvic ascites. Musculoskeletal: No fracture or acute finding. No osteoblastic or osteolytic lesions. IMPRESSION: 1. Air-fluid levels in the colon consistent with the history of diarrhea. No colonic wall thickening or inflammation. The findings are consistent with gastroenteritis. 2. No other evidence of an  acute abnormality. 3. Colonic diverticular with no evidence of diverticulitis. 4. Status post cholecystectomy and hysterectomy. 5. Severe left renal atrophy. Small left renal cyst and intrarenal stone. 6. Aortic atherosclerosis. Electronically Signed   By: [name] M.D.   On: 10/25/2016 15:06   Dg Chest 2 View  Result Date: 10/25/2016 CLINICAL DATA:  Epigastric pain and diarrhea.  Hypotension EXAM: CHEST  2 VIEW COMPARISON:  August 14, 2016 FINDINGS: There is atelectatic change in the left base. Lungs elsewhere are clear. Heart size and pulmonary vascularity are normal. Aorta is mildly ectatic with aortic atherosclerosis. There is degenerative change in the thoracic spine. No evident adenopathy. IMPRESSION: Left base atelectasis. No edema or consolidation. Aortic atherosclerosis. Heart size within normal limits. Electronically Signed   By: Lowella Grip III M.D.   On: 10/25/2016 13:22        Scheduled Meds: . aspirin  325 mg Oral Daily  . carvedilol  6.25 mg Oral BID WC  . cycloSPORINE  1 drop Both Eyes QHS  . ezetimibe  10 mg Oral Daily  . lisinopril  10 mg Oral BID  . multivitamin with minerals  1 tablet Oral Daily   Continuous Infusions: . sodium chloride 100 mL/hr at 10/26/16 0600  . vancomycin Stopped (10/25/16 2007)     LOS: 0 days    Time spent: [place], MD [facility]   If 7PM-7AM, please contact night-coverage [email or web address] Password [facility] 10/26/2016, 7:39 AM

## 2016-10-26 NOTE — Progress Notes (Signed)
Peripherally Inserted Central Catheter/Midline Placement  The IV Nurse has discussed with the patient and/or persons authorized to consent for the patient, the purpose of this procedure and the potential benefits and risks involved with this procedure.  The benefits include less needle sticks, lab draws from the catheter, and the patient may be discharged home with the catheter. Risks include, but not limited to, infection, bleeding, blood clot (thrombus formation), and puncture of an artery; nerve damage and irregular heartbeat and possibility to perform a PICC exchange if needed/ordered by physician.  Alternatives to this procedure were also discussed.  Bard Power PICC patient education guide, fact sheet on infection prevention and patient information card has been provided to patient /or left at bedside.    PICC/Midline Placement Documentation  PICC Single Lumen 61/95/09 PICC Right Basilic 35 cm (Active)  Indication for Insertion or Continuance of Line Home intravenous therapies (PICC only) 10/26/2016 12:00 PM  Dressing Change Due 11/02/16 10/26/2016 12:00 PM       Jule Economy [place] 10/26/2016, 12:42 PM

## 2016-10-27 ENCOUNTER — Other Ambulatory Visit (HOSPITAL_COMMUNITY): Payer: Medicare Other

## 2016-10-27 LAB — URINE CULTURE: Culture: >=100000 — AB

## 2016-10-27 MED ORDER — AMPICILLIN 500 MG PO CAPS: 500.0000 mg | ORAL_CAPSULE | Freq: Two times a day (BID) | ORAL | 0 refills | Status: DC

## 2016-10-27 NOTE — Discharge Summary (Signed)
Physician Discharge Summary  Shannon Serrano FGH:[id number] DOB: 05-16-1929 DOA: 10/25/2016  PCP: Mathews Argyle, MD  Admit date: 10/25/2016 Discharge date: 10/27/2016  Time spent: 20 minutes  Recommendations for Outpatient Follow-up:  1. Complete Ampicillin on d/c home on 10/31/16 2. Needs op cmet and cbc 1 week  Discharge Diagnoses:  Principal Problem:   Gastroenteritis Active Problems:   Dehydration   CKD (chronic kidney disease), stage III   Elevated troponin   Discharge Condition: improved  Diet recommendation: reg hh  Filed Weights   10/25/16 1658 10/25/16 2137 10/26/16 2155  Weight: 65.7 kg (144 lb 14.4 oz) 65.7 kg (144 lb 13.5 oz) 67.6 kg (149 lb)    History of present illness:  16  htn ckd stg III Breast cancer-hi grade DCIS s/p XRT Prior cva 2008 [tia's] Sciatica foll by ortho Prior CAD s/p CAth 08/14/16=mod non obs LAD disease  Recent diagnosed with UTI RX Presented to [facility] with diarrhea-REhydrated On admit wbc 13, no aki Ct done confimed gastro  Called [facility] and enterococcus was pan-sensitive-clarified patient could take varied meds-started on Ampicillin.  D/c Picc line needs op labs Might need consideration for Bladder suspension as OP as this is probably reason why she has recurrent UTI  No other concern for diarrhea which self resolved and was d/c home  Discharge Exam: Vitals:   10/27/16 0515 10/27/16 0803  BP: 123/69 (!) 155/80  Pulse: 81 70  Resp: 17 16  Temp: 97.6 F (36.4 C) 97.5 F (36.4 C)    General: alert pleasant in nad, no fever no chills Cardiovascular: s1 s2 no m/r/g Respiratory:  Chest is clear  Discharge Instructions   Discharge Instructions    Diet - low sodium heart healthy    Complete by:  As directed    Discharge instructions    Complete by:  As directed    Complete 4 more days of ampicillin You might need some Imodium or other diarrea controlling agent if you continue to have loose stool-discuss with your  primary MD Get labs in 1 week at primary MD office   Increase activity slowly    Complete by:  As directed      Current Discharge Medication List    START taking these medications   Details  ampicillin (PRINCIPEN) 500 MG capsule Take 1 capsule (500 mg total) by mouth 2 (two) times daily with a meal. Qty: 8 capsule, Refills: 0      CONTINUE these medications which have NOT CHANGED   Details  acetaminophen (TYLENOL) 500 MG tablet Take 1,000 mg by mouth every 6 (six) hours as needed for headache (pain).    aspirin EC 81 MG tablet Take 81 mg by mouth at bedtime.    B Complex Vitamins (B COMPLEX PO) Take 1 tablet by mouth daily.     CALCIUM PO Take 1 tablet by mouth at bedtime.    carvedilol (COREG) 6.25 MG tablet Take 1 tablet (6.25 mg total) by mouth 2 (two) times daily with a meal. Qty: 60 tablet, Refills: 0    Cholecalciferol (VITAMIN D) 2000 units tablet Take 2,000 Units by mouth daily.    Coenzyme Q10 (COQ10) 200 MG CAPS Take 200 mg by mouth daily.    cyclobenzaprine (FLEXERIL) 10 MG tablet TAKE 1 TABLET BY MOUTH THREE TIMES DAILY AS NEEDED FOR MUSCLE SPASMS Qty: 30 tablet, Refills: 0   Associated Diagnoses: Low back pain    lisinopril (PRINIVIL,ZESTRIL) 20 MG tablet Take 20 mg by mouth 2 (two)  times daily.    Magnesium 250 MG TABS Take 250 mg by mouth at bedtime as needed (constipation).    Multiple Vitamin (MULTI-VITAMIN DAILY PO) Take 1 packet by mouth daily. Dr. Gertie Exon packet    OVER THE COUNTER MEDICATION Place 1 drop into both eyes 2 (two) times daily. Preservative free over the counter lubricating eye drops    valACYclovir (VALTREX) 500 MG tablet Take 1 tablet (500 mg total) by mouth 2 (two) times daily. X 3 days only at onset of outbreak Qty: 30 tablet, Refills: 0    ZETIA 10 MG tablet TAKE 1 TABLET BY MOUTH DAILY Qty: 30 tablet, Refills: 1      STOP taking these medications     RESTASIS 0.05 % ophthalmic emulsion        Allergies  Allergen  Reactions  . Morphine Nausea And Vomiting  . Morphine And Related Nausea And Vomiting  . Ciprofloxacin Other (See Comments)    Unable to take due to repeated use for uti  . Codeine Nausea Only  . Eszopiclone Other (See Comments)    Short term memory loss - reaction to [place]  . Nitrofurantoin Nausea Only  . Oxycodone-Acetaminophen Other (See Comments)    Unknown   . Penicillins Nausea Only     Has patient had a PCN reaction causing immediate rash, facial/tongue/throat swelling, SOB or lightheadedness with hypotension: No Has patient had a PCN reaction causing severe rash involving mucus membranes or skin necrosis: No Has patient had a PCN reaction that required hospitalization No Has patient had a PCN reaction occurring within the last 10 years: No If all of the above answers are "NO", then may proceed with Cephalosporin use.  . Sulfa Antibiotics Rash      The results of significant diagnostics from this hospitalization (including imaging, microbiology, ancillary and laboratory) are listed below for reference.    Significant Diagnostic Studies: Ct Abdomen Pelvis Wo Contrast  Result Date: 10/25/2016 CLINICAL DATA:  Epigastric toe lower abdominal pain beginning today with some diarrhea and nausea. History of kidney disease. History of breast carcinoma. EXAM: CT ABDOMEN AND PELVIS WITHOUT CONTRAST TECHNIQUE: Multidetector CT imaging of the abdomen and pelvis was performed following the standard protocol without IV contrast. COMPARISON:  None. FINDINGS: Lower chest: Lung base subsegmental atelectasis. Heart is normal in size. Dense coronary artery calcifications. No acute findings at the lung bases. Hepatobiliary: No focal liver abnormality is seen. Status post cholecystectomy. No biliary dilatation. Pancreas: Unremarkable. No pancreatic ductal dilatation or surrounding inflammatory changes. Spleen: Normal in size without focal abnormality. Adrenals/Urinary Tract: Severe left renal atrophy.  Low-density mass consistent with a cyst arises from the left kidney upper pole measuring 12 mm. Small nonobstructing stone in the lower pole the left kidney. No left hydronephrosis. Normal left ureter. Mild prominence of the right intrarenal collecting system. Right ureter is normal course and in caliber with no stones. No right renal masses or intrarenal stones. Bladder is unremarkable. No adrenal masses. Stomach/Bowel: Stomach and small bowel unremarkable. There are left colon diverticula. No diverticulitis. Colon shows air-fluid levels. No wall thickening or inflammation. Vascular/Lymphatic: Aortic atherosclerosis. No enlarged abdominal or pelvic lymph nodes. Reproductive: Status post hysterectomy. No adnexal masses. Other: No abdominal wall hernia or abnormality. No abdominopelvic ascites. Musculoskeletal: No fracture or acute finding. No osteoblastic or osteolytic lesions. IMPRESSION: 1. Air-fluid levels in the colon consistent with the history of diarrhea. No colonic wall thickening or inflammation. The findings are consistent with gastroenteritis. 2. No other evidence of  an acute abnormality. 3. Colonic diverticular with no evidence of diverticulitis. 4. Status post cholecystectomy and hysterectomy. 5. Severe left renal atrophy. Small left renal cyst and intrarenal stone. 6. Aortic atherosclerosis. Electronically Signed   By: [name] M.D.   On: 10/25/2016 15:06   Dg Chest 2 View  Result Date: 10/25/2016 CLINICAL DATA:  Epigastric pain and diarrhea.  Hypotension EXAM: CHEST  2 VIEW COMPARISON:  August 14, 2016 FINDINGS: There is atelectatic change in the left base. Lungs elsewhere are clear. Heart size and pulmonary vascularity are normal. Aorta is mildly ectatic with aortic atherosclerosis. There is degenerative change in the thoracic spine. No evident adenopathy. IMPRESSION: Left base atelectasis. No edema or consolidation. Aortic atherosclerosis. Heart size within normal limits. Electronically  Signed   By: Lowella Grip III M.D.   On: 10/25/2016 13:22    Microbiology: Recent Results (from the past 240 hour(s))  Gastrointestinal Panel by PCR , Stool     Status: None   Collection Time: 10/25/16 12:39 PM  Result Value Ref Range Status   Campylobacter species NOT DETECTED NOT DETECTED Final   Plesimonas shigelloides NOT DETECTED NOT DETECTED Final   Salmonella species NOT DETECTED NOT DETECTED Final   Yersinia enterocolitica NOT DETECTED NOT DETECTED Final   Vibrio species NOT DETECTED NOT DETECTED Final   Vibrio cholerae NOT DETECTED NOT DETECTED Final   Enteroaggregative E coli (EAEC) NOT DETECTED NOT DETECTED Final   Enteropathogenic E coli (EPEC) NOT DETECTED NOT DETECTED Final   Enterotoxigenic E coli (ETEC) NOT DETECTED NOT DETECTED Final   Shiga like toxin producing E coli (STEC) NOT DETECTED NOT DETECTED Final   Shigella/Enteroinvasive E coli (EIEC) NOT DETECTED NOT DETECTED Final   Cryptosporidium NOT DETECTED NOT DETECTED Final   Cyclospora cayetanensis NOT DETECTED NOT DETECTED Final   Entamoeba histolytica NOT DETECTED NOT DETECTED Final   Giardia lamblia NOT DETECTED NOT DETECTED Final   Adenovirus F40/41 NOT DETECTED NOT DETECTED Final   Astrovirus NOT DETECTED NOT DETECTED Final   Norovirus GI/GII NOT DETECTED NOT DETECTED Final   Rotavirus A NOT DETECTED NOT DETECTED Final   Sapovirus (I, II, IV, and V) NOT DETECTED NOT DETECTED Final  C difficile quick scan w PCR reflex     Status: None   Collection Time: 10/25/16 12:39 PM  Result Value Ref Range Status   C Diff antigen NEGATIVE NEGATIVE Final   C Diff toxin NEGATIVE NEGATIVE Final   C Diff interpretation No C. difficile detected.  Final  Urine culture     Status: Abnormal   Collection Time: 10/25/16 12:52 PM  Result Value Ref Range Status   Specimen Description URINE, CATHETERIZED  Final   Special Requests NONE  Final   Culture >=100,000 COLONIES/mL ENTEROCOCCUS FAECALIS (A)  Final   Report Status  10/27/2016 FINAL  Final   Organism ID, Bacteria ENTEROCOCCUS FAECALIS (A)  Final      Susceptibility   Enterococcus faecalis - MIC*    AMPICILLIN <=2 SENSITIVE Sensitive     LEVOFLOXACIN 1 SENSITIVE Sensitive     NITROFURANTOIN <=16 SENSITIVE Sensitive     VANCOMYCIN 1 SENSITIVE Sensitive     * >=100,000 COLONIES/mL ENTEROCOCCUS FAECALIS     Labs: Basic Metabolic Panel:  Recent Labs Lab 10/25/16 1245 10/26/16 0432  NA 133* 138  K 4.5 4.5  CL 101 107  CO2 19* 25  GLUCOSE 117* 90  BUN 19 14  CREATININE 1.00 0.87  CALCIUM 9.8 8.6*   Liver Function  Tests:  Recent Labs Lab 10/25/16 1245  AST 37  ALT 23  ALKPHOS 60  BILITOT 1.1  PROT 7.3  ALBUMIN 4.2    Recent Labs Lab 10/25/16 1245  LIPASE 43   No results for input(s): AMMONIA in the last 168 hours. CBC:  Recent Labs Lab 10/25/16 1245 10/26/16 0432  WBC 13.2* 7.6  NEUTROABS 8.5*  --   HGB 15.6* 11.6*  HCT 46.2* 35.0*  MCV 94.7 93.6  PLT 206 191   Cardiac Enzymes:  Recent Labs Lab 10/25/16 1245 10/25/16 2125 10/26/16 0432  TROPONINI 0.03* <0.03 <0.03   BNP: BNP (last 3 results)  Recent Labs  10/30/15 1455 08/14/16 1828 10/25/16 2125  BNP 38.2 43.1 26.3    ProBNP (last 3 results) No results for input(s): PROBNP in the last 8760 hours.  CBG: No results for input(s): GLUCAP in the last 168 hours.     SignedNita Sells MD   [facility] 10/27/2016, 9:39 AM

## 2016-10-27 NOTE — Progress Notes (Signed)
Shelton Silvas to be D/C'd Home per MD order.  Discussed prescriptions and follow up appointments with the patient. Prescriptions given to patient, medication list explained in detail. Pt verbalized understanding.  Allergies as of 10/27/2016      Reactions   Morphine Nausea And Vomiting   Morphine And Related Nausea And Vomiting   Ciprofloxacin Other (See Comments)   Unable to take due to repeated use for uti   Codeine Nausea Only   Eszopiclone Other (See Comments)   Short term memory loss - reaction to [place]   Nitrofurantoin Nausea Only   Oxycodone-acetaminophen Other (See Comments)   Unknown    Penicillins Nausea Only    Has patient had a PCN reaction causing immediate rash, facial/tongue/throat swelling, SOB or lightheadedness with hypotension: No Has patient had a PCN reaction causing severe rash involving mucus membranes or skin necrosis: No Has patient had a PCN reaction that required hospitalization No Has patient had a PCN reaction occurring within the last 10 years: No If all of the above answers are "NO", then may proceed with Cephalosporin use.   Sulfa Antibiotics Rash      Medication List    TAKE these medications   acetaminophen 500 MG tablet Commonly known as:  TYLENOL Take 1,000 mg by mouth every 6 (six) hours as needed for headache (pain).   ampicillin 500 MG capsule Commonly known as:  PRINCIPEN Take 1 capsule (500 mg total) by mouth 2 (two) times daily with a meal.   aspirin EC 81 MG tablet Take 81 mg by mouth at bedtime.   B COMPLEX PO Take 1 tablet by mouth daily.   CALCIUM PO Take 1 tablet by mouth at bedtime.   carvedilol 6.25 MG tablet Commonly known as:  COREG Take 1 tablet (6.25 mg total) by mouth 2 (two) times daily with a meal.   CoQ10 200 MG Caps Take 200 mg by mouth daily.   cyclobenzaprine 10 MG tablet Commonly known as:  FLEXERIL TAKE 1 TABLET BY MOUTH THREE TIMES DAILY AS NEEDED FOR MUSCLE SPASMS What changed:  See the new  instructions.   lisinopril 20 MG tablet Commonly known as:  PRINIVIL,ZESTRIL Take 20 mg by mouth 2 (two) times daily.   Magnesium 250 MG Tabs Take 250 mg by mouth at bedtime as needed (constipation).   MULTI-VITAMIN DAILY PO Take 1 packet by mouth daily. Dr. Gertie Exon packet   OVER THE COUNTER MEDICATION Place 1 drop into both eyes 2 (two) times daily. Preservative free over the counter lubricating eye drops   valACYclovir 500 MG tablet Commonly known as:  VALTREX Take 1 tablet (500 mg total) by mouth 2 (two) times daily. X 3 days only at onset of outbreak What changed:  when to take this  additional instructions   Vitamin D 2000 units tablet Take 2,000 Units by mouth daily.   ZETIA 10 MG tablet Generic drug:  ezetimibe TAKE 1 TABLET BY MOUTH DAILY What changed:  See the new instructions.       Vitals:   10/27/16 0515 10/27/16 0803  BP: 123/69 (!) 155/80  Pulse: 81 70  Resp: 17 16  Temp: 97.6 F (36.4 C) 97.5 F (36.4 C)    Skin clean, dry and intact without evidence of skin break down, no evidence of skin tears noted. IV catheter discontinued intact. Site without signs and symptoms of complications. Dressing and pressure applied. Pt denies pain at this time. No complaints noted.  An After Visit Summary was  printed and given to the patient. Patient escorted via [place], and D/C home via private auto.  Emilio Math, RN [facility] Phone [phone]

## 2016-10-29 DIAGNOSIS — H18453 Nodular corneal degeneration, bilateral: Secondary | ICD-10-CM | POA: Diagnosis not present

## 2016-10-29 DIAGNOSIS — H04123 Dry eye syndrome of bilateral lacrimal glands: Secondary | ICD-10-CM | POA: Diagnosis not present

## 2016-10-29 DIAGNOSIS — H40013 Open angle with borderline findings, low risk, bilateral: Secondary | ICD-10-CM | POA: Diagnosis not present

## 2016-11-02 DIAGNOSIS — R8299 Other abnormal findings in urine: Secondary | ICD-10-CM | POA: Diagnosis not present

## 2016-11-13 DIAGNOSIS — I129 Hypertensive chronic kidney disease with stage 1 through stage 4 chronic kidney disease, or unspecified chronic kidney disease: Secondary | ICD-10-CM | POA: Diagnosis not present

## 2016-11-13 DIAGNOSIS — N183 Chronic kidney disease, stage 3 (moderate): Secondary | ICD-10-CM | POA: Diagnosis not present

## 2016-11-17 DIAGNOSIS — Z87440 Personal history of urinary (tract) infections: Secondary | ICD-10-CM | POA: Diagnosis not present

## 2016-11-17 DIAGNOSIS — R8299 Other abnormal findings in urine: Secondary | ICD-10-CM | POA: Diagnosis not present

## 2016-12-08 DIAGNOSIS — Z0000 Encounter for general adult medical examination without abnormal findings: Secondary | ICD-10-CM | POA: Diagnosis not present

## 2016-12-08 DIAGNOSIS — E871 Hypo-osmolality and hyponatremia: Secondary | ICD-10-CM | POA: Diagnosis not present

## 2016-12-08 DIAGNOSIS — E7800 Pure hypercholesterolemia, unspecified: Secondary | ICD-10-CM | POA: Diagnosis not present

## 2016-12-08 DIAGNOSIS — I10 Essential (primary) hypertension: Secondary | ICD-10-CM | POA: Diagnosis not present

## 2016-12-08 DIAGNOSIS — R0789 Other chest pain: Secondary | ICD-10-CM | POA: Diagnosis not present

## 2016-12-08 DIAGNOSIS — Z79899 Other long term (current) drug therapy: Secondary | ICD-10-CM | POA: Diagnosis not present

## 2016-12-08 DIAGNOSIS — Z1389 Encounter for screening for other disorder: Secondary | ICD-10-CM | POA: Diagnosis not present

## 2016-12-10 DIAGNOSIS — M7989 Other specified soft tissue disorders: Secondary | ICD-10-CM | POA: Diagnosis not present

## 2016-12-10 DIAGNOSIS — W1839XA Other fall on same level, initial encounter: Secondary | ICD-10-CM | POA: Diagnosis not present

## 2016-12-10 DIAGNOSIS — M50322 Other cervical disc degeneration at C5-C6 level: Secondary | ICD-10-CM | POA: Diagnosis not present

## 2016-12-10 DIAGNOSIS — S0083XA Contusion of other part of head, initial encounter: Secondary | ICD-10-CM | POA: Diagnosis not present

## 2016-12-10 DIAGNOSIS — Z888 Allergy status to other drugs, medicaments and biological substances status: Secondary | ICD-10-CM | POA: Diagnosis not present

## 2016-12-10 DIAGNOSIS — J322 Chronic ethmoidal sinusitis: Secondary | ICD-10-CM | POA: Diagnosis not present

## 2016-12-10 DIAGNOSIS — M1712 Unilateral primary osteoarthritis, left knee: Secondary | ICD-10-CM | POA: Diagnosis not present

## 2016-12-10 DIAGNOSIS — I10 Essential (primary) hypertension: Secondary | ICD-10-CM | POA: Diagnosis not present

## 2016-12-10 DIAGNOSIS — Z882 Allergy status to sulfonamides status: Secondary | ICD-10-CM | POA: Diagnosis not present

## 2016-12-10 DIAGNOSIS — M25561 Pain in right knee: Secondary | ICD-10-CM | POA: Diagnosis not present

## 2016-12-10 DIAGNOSIS — R9082 White matter disease, unspecified: Secondary | ICD-10-CM | POA: Diagnosis not present

## 2016-12-10 DIAGNOSIS — S0512XA Contusion of eyeball and orbital tissues, left eye, initial encounter: Secondary | ICD-10-CM | POA: Diagnosis not present

## 2016-12-10 DIAGNOSIS — M50323 Other cervical disc degeneration at C6-C7 level: Secondary | ICD-10-CM | POA: Diagnosis not present

## 2016-12-10 DIAGNOSIS — S8001XA Contusion of right knee, initial encounter: Secondary | ICD-10-CM | POA: Diagnosis not present

## 2016-12-10 DIAGNOSIS — Z885 Allergy status to narcotic agent status: Secondary | ICD-10-CM | POA: Diagnosis not present

## 2016-12-10 DIAGNOSIS — Z79899 Other long term (current) drug therapy: Secondary | ICD-10-CM | POA: Diagnosis not present

## 2016-12-10 DIAGNOSIS — R51 Headache: Secondary | ICD-10-CM | POA: Diagnosis not present

## 2016-12-10 DIAGNOSIS — R220 Localized swelling, mass and lump, head: Secondary | ICD-10-CM | POA: Diagnosis not present

## 2016-12-10 DIAGNOSIS — S8002XA Contusion of left knee, initial encounter: Secondary | ICD-10-CM | POA: Diagnosis not present

## 2016-12-10 DIAGNOSIS — M79642 Pain in left hand: Secondary | ICD-10-CM | POA: Diagnosis not present

## 2016-12-10 DIAGNOSIS — M25562 Pain in left knee: Secondary | ICD-10-CM | POA: Diagnosis not present

## 2016-12-10 DIAGNOSIS — Z7982 Long term (current) use of aspirin: Secondary | ICD-10-CM | POA: Diagnosis not present

## 2016-12-10 DIAGNOSIS — Z880 Allergy status to penicillin: Secondary | ICD-10-CM | POA: Diagnosis not present

## 2016-12-10 DIAGNOSIS — N289 Disorder of kidney and ureter, unspecified: Secondary | ICD-10-CM | POA: Diagnosis not present

## 2016-12-10 DIAGNOSIS — M791 Myalgia: Secondary | ICD-10-CM | POA: Diagnosis not present

## 2016-12-10 DIAGNOSIS — Z720 Tobacco use: Secondary | ICD-10-CM | POA: Diagnosis not present

## 2016-12-16 DIAGNOSIS — I10 Essential (primary) hypertension: Secondary | ICD-10-CM | POA: Diagnosis not present

## 2016-12-16 DIAGNOSIS — E871 Hypo-osmolality and hyponatremia: Secondary | ICD-10-CM | POA: Diagnosis not present

## 2016-12-16 DIAGNOSIS — T148XXA Other injury of unspecified body region, initial encounter: Secondary | ICD-10-CM | POA: Diagnosis not present

## 2016-12-16 DIAGNOSIS — R269 Unspecified abnormalities of gait and mobility: Secondary | ICD-10-CM | POA: Diagnosis not present

## 2016-12-17 DIAGNOSIS — E871 Hypo-osmolality and hyponatremia: Secondary | ICD-10-CM | POA: Diagnosis not present

## 2016-12-17 DIAGNOSIS — I10 Essential (primary) hypertension: Secondary | ICD-10-CM | POA: Diagnosis not present

## 2016-12-17 DIAGNOSIS — Z882 Allergy status to sulfonamides status: Secondary | ICD-10-CM | POA: Diagnosis not present

## 2016-12-17 DIAGNOSIS — N390 Urinary tract infection, site not specified: Secondary | ICD-10-CM | POA: Diagnosis not present

## 2016-12-17 DIAGNOSIS — Z79899 Other long term (current) drug therapy: Secondary | ICD-10-CM | POA: Diagnosis not present

## 2016-12-17 DIAGNOSIS — Z885 Allergy status to narcotic agent status: Secondary | ICD-10-CM | POA: Diagnosis not present

## 2016-12-17 DIAGNOSIS — Z531 Procedure and treatment not carried out because of patient's decision for reasons of belief and group pressure: Secondary | ICD-10-CM | POA: Diagnosis present

## 2016-12-17 DIAGNOSIS — G935 Compression of brain: Secondary | ICD-10-CM | POA: Diagnosis not present

## 2016-12-17 DIAGNOSIS — E876 Hypokalemia: Secondary | ICD-10-CM | POA: Diagnosis not present

## 2016-12-17 DIAGNOSIS — D72829 Elevated white blood cell count, unspecified: Secondary | ICD-10-CM | POA: Diagnosis not present

## 2016-12-17 DIAGNOSIS — R4789 Other speech disturbances: Secondary | ICD-10-CM | POA: Diagnosis present

## 2016-12-17 DIAGNOSIS — S098XXA Other specified injuries of head, initial encounter: Secondary | ICD-10-CM | POA: Diagnosis not present

## 2016-12-17 DIAGNOSIS — I351 Nonrheumatic aortic (valve) insufficiency: Secondary | ICD-10-CM | POA: Diagnosis not present

## 2016-12-17 DIAGNOSIS — R220 Localized swelling, mass and lump, head: Secondary | ICD-10-CM | POA: Diagnosis not present

## 2016-12-17 DIAGNOSIS — M5481 Occipital neuralgia: Secondary | ICD-10-CM | POA: Diagnosis not present

## 2016-12-17 DIAGNOSIS — Z880 Allergy status to penicillin: Secondary | ICD-10-CM | POA: Diagnosis not present

## 2016-12-17 DIAGNOSIS — Z7982 Long term (current) use of aspirin: Secondary | ICD-10-CM | POA: Diagnosis not present

## 2016-12-17 DIAGNOSIS — S0512XA Contusion of eyeball and orbital tissues, left eye, initial encounter: Secondary | ICD-10-CM | POA: Diagnosis not present

## 2016-12-17 DIAGNOSIS — I6200 Nontraumatic subdural hemorrhage, unspecified: Secondary | ICD-10-CM | POA: Diagnosis not present

## 2016-12-17 DIAGNOSIS — W19XXXA Unspecified fall, initial encounter: Secondary | ICD-10-CM | POA: Diagnosis not present

## 2016-12-17 DIAGNOSIS — I160 Hypertensive urgency: Secondary | ICD-10-CM | POA: Diagnosis not present

## 2016-12-17 DIAGNOSIS — I6201 Nontraumatic acute subdural hemorrhage: Secondary | ICD-10-CM | POA: Diagnosis not present

## 2016-12-17 DIAGNOSIS — M542 Cervicalgia: Secondary | ICD-10-CM | POA: Diagnosis not present

## 2016-12-17 DIAGNOSIS — D649 Anemia, unspecified: Secondary | ICD-10-CM | POA: Diagnosis not present

## 2016-12-17 DIAGNOSIS — R2681 Unsteadiness on feet: Secondary | ICD-10-CM | POA: Diagnosis not present

## 2016-12-17 DIAGNOSIS — Z888 Allergy status to other drugs, medicaments and biological substances status: Secondary | ICD-10-CM | POA: Diagnosis not present

## 2016-12-17 DIAGNOSIS — R4182 Altered mental status, unspecified: Secondary | ICD-10-CM | POA: Diagnosis not present

## 2016-12-17 DIAGNOSIS — S065X0A Traumatic subdural hemorrhage without loss of consciousness, initial encounter: Secondary | ICD-10-CM | POA: Diagnosis not present

## 2016-12-17 DIAGNOSIS — Z9181 History of falling: Secondary | ICD-10-CM | POA: Diagnosis not present

## 2016-12-17 DIAGNOSIS — S0993XA Unspecified injury of face, initial encounter: Secondary | ICD-10-CM | POA: Diagnosis not present

## 2016-12-22 DIAGNOSIS — I6200 Nontraumatic subdural hemorrhage, unspecified: Secondary | ICD-10-CM | POA: Diagnosis not present

## 2016-12-22 DIAGNOSIS — M79605 Pain in left leg: Secondary | ICD-10-CM | POA: Diagnosis not present

## 2016-12-22 DIAGNOSIS — R252 Cramp and spasm: Secondary | ICD-10-CM | POA: Diagnosis not present

## 2016-12-22 DIAGNOSIS — I129 Hypertensive chronic kidney disease with stage 1 through stage 4 chronic kidney disease, or unspecified chronic kidney disease: Secondary | ICD-10-CM | POA: Diagnosis not present

## 2016-12-22 DIAGNOSIS — R51 Headache: Secondary | ICD-10-CM | POA: Diagnosis not present

## 2016-12-22 DIAGNOSIS — R2689 Other abnormalities of gait and mobility: Secondary | ICD-10-CM | POA: Diagnosis not present

## 2016-12-22 DIAGNOSIS — E876 Hypokalemia: Secondary | ICD-10-CM | POA: Diagnosis not present

## 2016-12-22 DIAGNOSIS — S065X9A Traumatic subdural hemorrhage with loss of consciousness of unspecified duration, initial encounter: Secondary | ICD-10-CM | POA: Diagnosis not present

## 2016-12-22 DIAGNOSIS — M7989 Other specified soft tissue disorders: Secondary | ICD-10-CM | POA: Diagnosis not present

## 2016-12-22 DIAGNOSIS — G9341 Metabolic encephalopathy: Secondary | ICD-10-CM | POA: Diagnosis not present

## 2016-12-22 DIAGNOSIS — N3000 Acute cystitis without hematuria: Secondary | ICD-10-CM | POA: Diagnosis not present

## 2016-12-22 DIAGNOSIS — R4701 Aphasia: Secondary | ICD-10-CM | POA: Diagnosis not present

## 2016-12-22 DIAGNOSIS — G935 Compression of brain: Secondary | ICD-10-CM | POA: Diagnosis not present

## 2016-12-22 DIAGNOSIS — I672 Cerebral atherosclerosis: Secondary | ICD-10-CM | POA: Diagnosis not present

## 2016-12-22 DIAGNOSIS — I10 Essential (primary) hypertension: Secondary | ICD-10-CM | POA: Diagnosis not present

## 2016-12-22 DIAGNOSIS — R4182 Altered mental status, unspecified: Secondary | ICD-10-CM | POA: Diagnosis not present

## 2016-12-22 DIAGNOSIS — N183 Chronic kidney disease, stage 3 (moderate): Secondary | ICD-10-CM | POA: Diagnosis not present

## 2016-12-22 DIAGNOSIS — Z7982 Long term (current) use of aspirin: Secondary | ICD-10-CM | POA: Diagnosis not present

## 2016-12-22 DIAGNOSIS — S065X0A Traumatic subdural hemorrhage without loss of consciousness, initial encounter: Secondary | ICD-10-CM | POA: Diagnosis not present

## 2016-12-22 DIAGNOSIS — N390 Urinary tract infection, site not specified: Secondary | ICD-10-CM | POA: Diagnosis not present

## 2016-12-22 DIAGNOSIS — Z111 Encounter for screening for respiratory tuberculosis: Secondary | ICD-10-CM | POA: Diagnosis not present

## 2016-12-22 DIAGNOSIS — Z79899 Other long term (current) drug therapy: Secondary | ICD-10-CM | POA: Diagnosis not present

## 2016-12-22 DIAGNOSIS — S065X0D Traumatic subdural hemorrhage without loss of consciousness, subsequent encounter: Secondary | ICD-10-CM | POA: Diagnosis not present

## 2016-12-29 DIAGNOSIS — Z882 Allergy status to sulfonamides status: Secondary | ICD-10-CM | POA: Diagnosis not present

## 2016-12-29 DIAGNOSIS — G936 Cerebral edema: Secondary | ICD-10-CM | POA: Diagnosis present

## 2016-12-29 DIAGNOSIS — E785 Hyperlipidemia, unspecified: Secondary | ICD-10-CM | POA: Diagnosis present

## 2016-12-29 DIAGNOSIS — I878 Other specified disorders of veins: Secondary | ICD-10-CM | POA: Diagnosis not present

## 2016-12-29 DIAGNOSIS — Z4682 Encounter for fitting and adjustment of non-vascular catheter: Secondary | ICD-10-CM | POA: Diagnosis not present

## 2016-12-29 DIAGNOSIS — B965 Pseudomonas (aeruginosa) (mallei) (pseudomallei) as the cause of diseases classified elsewhere: Secondary | ICD-10-CM | POA: Diagnosis present

## 2016-12-29 DIAGNOSIS — S065X0D Traumatic subdural hemorrhage without loss of consciousness, subsequent encounter: Secondary | ICD-10-CM | POA: Diagnosis not present

## 2016-12-29 DIAGNOSIS — N3000 Acute cystitis without hematuria: Secondary | ICD-10-CM | POA: Diagnosis not present

## 2016-12-29 DIAGNOSIS — Z7982 Long term (current) use of aspirin: Secondary | ICD-10-CM | POA: Diagnosis not present

## 2016-12-29 DIAGNOSIS — R569 Unspecified convulsions: Secondary | ICD-10-CM | POA: Diagnosis not present

## 2016-12-29 DIAGNOSIS — R1310 Dysphagia, unspecified: Secondary | ICD-10-CM | POA: Diagnosis not present

## 2016-12-29 DIAGNOSIS — I519 Heart disease, unspecified: Secondary | ICD-10-CM | POA: Diagnosis not present

## 2016-12-29 DIAGNOSIS — G9341 Metabolic encephalopathy: Secondary | ICD-10-CM | POA: Diagnosis not present

## 2016-12-29 DIAGNOSIS — D649 Anemia, unspecified: Secondary | ICD-10-CM | POA: Diagnosis present

## 2016-12-29 DIAGNOSIS — H9190 Unspecified hearing loss, unspecified ear: Secondary | ICD-10-CM | POA: Diagnosis present

## 2016-12-29 DIAGNOSIS — I6200 Nontraumatic subdural hemorrhage, unspecified: Secondary | ICD-10-CM | POA: Diagnosis not present

## 2016-12-29 DIAGNOSIS — E871 Hypo-osmolality and hyponatremia: Secondary | ICD-10-CM | POA: Diagnosis not present

## 2016-12-29 DIAGNOSIS — Z79899 Other long term (current) drug therapy: Secondary | ICD-10-CM | POA: Diagnosis not present

## 2016-12-29 DIAGNOSIS — E876 Hypokalemia: Secondary | ICD-10-CM | POA: Diagnosis not present

## 2016-12-29 DIAGNOSIS — G9389 Other specified disorders of brain: Secondary | ICD-10-CM | POA: Diagnosis not present

## 2016-12-29 DIAGNOSIS — I10 Essential (primary) hypertension: Secondary | ICD-10-CM | POA: Diagnosis not present

## 2016-12-29 DIAGNOSIS — G935 Compression of brain: Secondary | ICD-10-CM | POA: Diagnosis not present

## 2016-12-29 DIAGNOSIS — R4701 Aphasia: Secondary | ICD-10-CM | POA: Diagnosis not present

## 2016-12-29 DIAGNOSIS — N3001 Acute cystitis with hematuria: Secondary | ICD-10-CM | POA: Diagnosis not present

## 2016-12-29 DIAGNOSIS — G9340 Encephalopathy, unspecified: Secondary | ICD-10-CM | POA: Diagnosis not present

## 2016-12-29 DIAGNOSIS — Z885 Allergy status to narcotic agent status: Secondary | ICD-10-CM | POA: Diagnosis not present

## 2016-12-29 DIAGNOSIS — Z880 Allergy status to penicillin: Secondary | ICD-10-CM | POA: Diagnosis not present

## 2016-12-29 DIAGNOSIS — Z888 Allergy status to other drugs, medicaments and biological substances status: Secondary | ICD-10-CM | POA: Diagnosis not present

## 2016-12-29 DIAGNOSIS — R9401 Abnormal electroencephalogram [EEG]: Secondary | ICD-10-CM | POA: Diagnosis not present

## 2016-12-29 DIAGNOSIS — I080 Rheumatic disorders of both mitral and aortic valves: Secondary | ICD-10-CM | POA: Diagnosis not present

## 2016-12-29 DIAGNOSIS — S065X9A Traumatic subdural hemorrhage with loss of consciousness of unspecified duration, initial encounter: Secondary | ICD-10-CM | POA: Diagnosis not present

## 2017-01-04 DIAGNOSIS — G9341 Metabolic encephalopathy: Secondary | ICD-10-CM | POA: Diagnosis not present

## 2017-01-04 DIAGNOSIS — N189 Chronic kidney disease, unspecified: Secondary | ICD-10-CM | POA: Diagnosis not present

## 2017-01-04 DIAGNOSIS — F39 Unspecified mood [affective] disorder: Secondary | ICD-10-CM | POA: Diagnosis not present

## 2017-01-04 DIAGNOSIS — S061X0D Traumatic cerebral edema without loss of consciousness, subsequent encounter: Secondary | ICD-10-CM | POA: Diagnosis not present

## 2017-01-04 DIAGNOSIS — D649 Anemia, unspecified: Secondary | ICD-10-CM | POA: Diagnosis not present

## 2017-01-04 DIAGNOSIS — Z79899 Other long term (current) drug therapy: Secondary | ICD-10-CM | POA: Diagnosis not present

## 2017-01-04 DIAGNOSIS — G935 Compression of brain: Secondary | ICD-10-CM | POA: Diagnosis not present

## 2017-01-04 DIAGNOSIS — Z880 Allergy status to penicillin: Secondary | ICD-10-CM | POA: Diagnosis not present

## 2017-01-04 DIAGNOSIS — R2689 Other abnormalities of gait and mobility: Secondary | ICD-10-CM | POA: Diagnosis not present

## 2017-01-04 DIAGNOSIS — S065X0A Traumatic subdural hemorrhage without loss of consciousness, initial encounter: Secondary | ICD-10-CM | POA: Diagnosis not present

## 2017-01-04 DIAGNOSIS — Z111 Encounter for screening for respiratory tuberculosis: Secondary | ICD-10-CM | POA: Diagnosis not present

## 2017-01-04 DIAGNOSIS — Z881 Allergy status to other antibiotic agents status: Secondary | ICD-10-CM | POA: Diagnosis not present

## 2017-01-04 DIAGNOSIS — H04123 Dry eye syndrome of bilateral lacrimal glands: Secondary | ICD-10-CM | POA: Diagnosis not present

## 2017-01-04 DIAGNOSIS — R4701 Aphasia: Secondary | ICD-10-CM | POA: Diagnosis not present

## 2017-01-04 DIAGNOSIS — E876 Hypokalemia: Secondary | ICD-10-CM | POA: Diagnosis not present

## 2017-01-04 DIAGNOSIS — R569 Unspecified convulsions: Secondary | ICD-10-CM | POA: Diagnosis not present

## 2017-01-04 DIAGNOSIS — S065X0D Traumatic subdural hemorrhage without loss of consciousness, subsequent encounter: Secondary | ICD-10-CM | POA: Diagnosis not present

## 2017-01-04 DIAGNOSIS — S065X9D Traumatic subdural hemorrhage with loss of consciousness of unspecified duration, subsequent encounter: Secondary | ICD-10-CM | POA: Diagnosis not present

## 2017-01-04 DIAGNOSIS — R4182 Altered mental status, unspecified: Secondary | ICD-10-CM | POA: Diagnosis not present

## 2017-01-04 DIAGNOSIS — Z48811 Encounter for surgical aftercare following surgery on the nervous system: Secondary | ICD-10-CM | POA: Diagnosis not present

## 2017-01-04 DIAGNOSIS — G9389 Other specified disorders of brain: Secondary | ICD-10-CM | POA: Diagnosis not present

## 2017-01-04 DIAGNOSIS — I6200 Nontraumatic subdural hemorrhage, unspecified: Secondary | ICD-10-CM | POA: Diagnosis not present

## 2017-01-04 DIAGNOSIS — Z885 Allergy status to narcotic agent status: Secondary | ICD-10-CM | POA: Diagnosis not present

## 2017-01-04 DIAGNOSIS — E871 Hypo-osmolality and hyponatremia: Secondary | ICD-10-CM | POA: Diagnosis not present

## 2017-01-04 DIAGNOSIS — Z882 Allergy status to sulfonamides status: Secondary | ICD-10-CM | POA: Diagnosis not present

## 2017-01-04 DIAGNOSIS — I6523 Occlusion and stenosis of bilateral carotid arteries: Secondary | ICD-10-CM | POA: Diagnosis not present

## 2017-01-04 DIAGNOSIS — N3001 Acute cystitis with hematuria: Secondary | ICD-10-CM | POA: Diagnosis not present

## 2017-01-04 DIAGNOSIS — S065X9A Traumatic subdural hemorrhage with loss of consciousness of unspecified duration, initial encounter: Secondary | ICD-10-CM | POA: Diagnosis not present

## 2017-01-04 DIAGNOSIS — S0083XD Contusion of other part of head, subsequent encounter: Secondary | ICD-10-CM | POA: Diagnosis not present

## 2017-01-04 DIAGNOSIS — N390 Urinary tract infection, site not specified: Secondary | ICD-10-CM | POA: Diagnosis not present

## 2017-01-04 DIAGNOSIS — E7800 Pure hypercholesterolemia, unspecified: Secondary | ICD-10-CM | POA: Diagnosis not present

## 2017-01-04 DIAGNOSIS — N3000 Acute cystitis without hematuria: Secondary | ICD-10-CM | POA: Diagnosis not present

## 2017-01-04 DIAGNOSIS — R296 Repeated falls: Secondary | ICD-10-CM | POA: Diagnosis not present

## 2017-01-04 DIAGNOSIS — I638 Other cerebral infarction: Secondary | ICD-10-CM | POA: Diagnosis not present

## 2017-01-04 DIAGNOSIS — I129 Hypertensive chronic kidney disease with stage 1 through stage 4 chronic kidney disease, or unspecified chronic kidney disease: Secondary | ICD-10-CM | POA: Diagnosis not present

## 2017-01-04 DIAGNOSIS — R2990 Unspecified symptoms and signs involving the nervous system: Secondary | ICD-10-CM | POA: Diagnosis not present

## 2017-01-04 DIAGNOSIS — I639 Cerebral infarction, unspecified: Secondary | ICD-10-CM | POA: Diagnosis not present

## 2017-01-04 DIAGNOSIS — G9340 Encephalopathy, unspecified: Secondary | ICD-10-CM | POA: Diagnosis not present

## 2017-01-04 DIAGNOSIS — B965 Pseudomonas (aeruginosa) (mallei) (pseudomallei) as the cause of diseases classified elsewhere: Secondary | ICD-10-CM | POA: Diagnosis not present

## 2017-01-04 DIAGNOSIS — I10 Essential (primary) hypertension: Secondary | ICD-10-CM | POA: Diagnosis not present

## 2017-01-18 IMAGING — US US EXTREM LOW VENOUS*L*
1 series · 13 of 24 positions shown · non-contrast
Comparison: None.

CLINICAL DATA: Left lower extremity pain.  Evaluate for DVT.



[Series 1: us extrem low venous*left* · 13 of 34 slices shown]
[im 1/34]
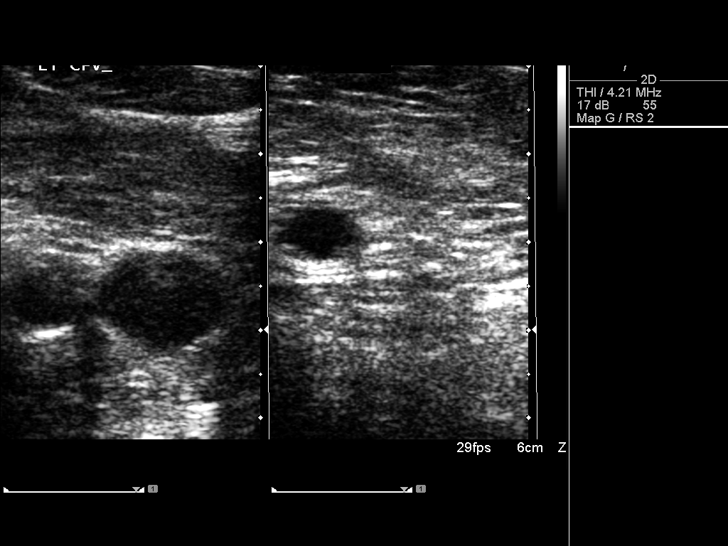
[im 3/34]
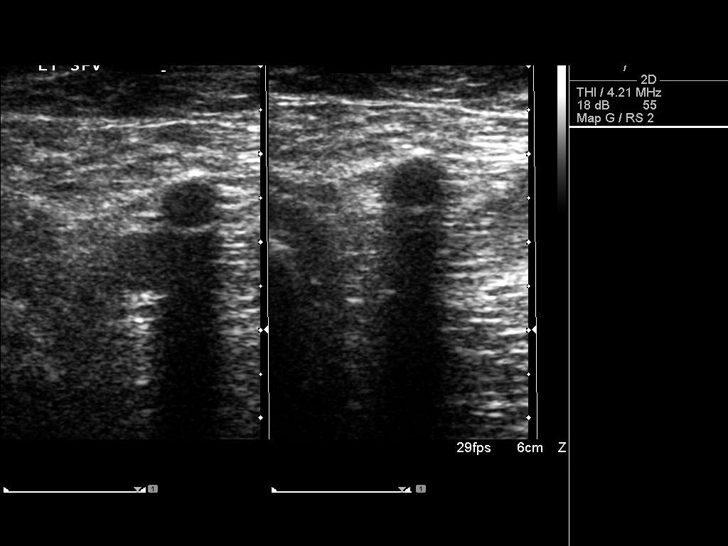
[im 6/34]
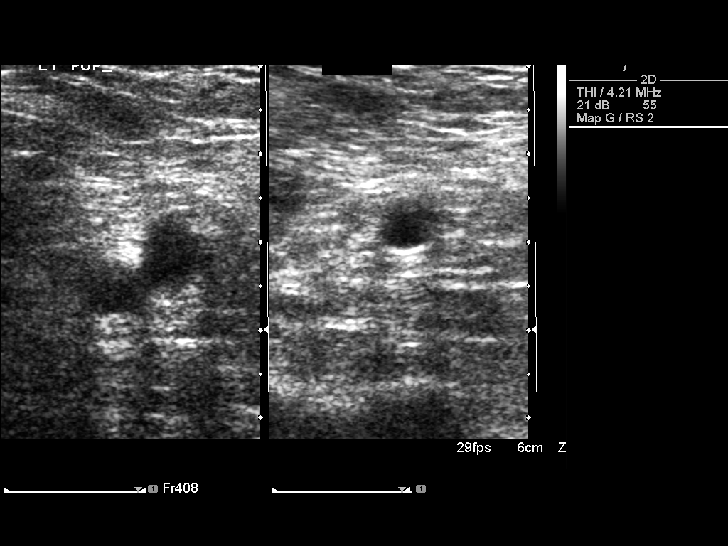
[im 9/34]
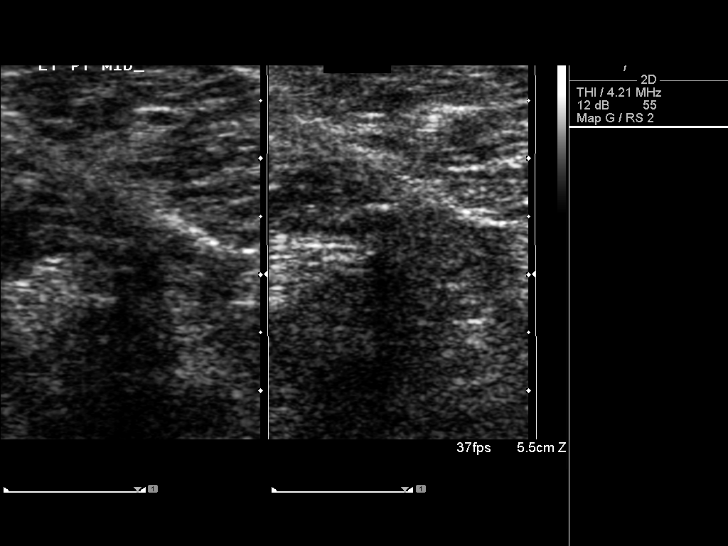
[im 12/34]
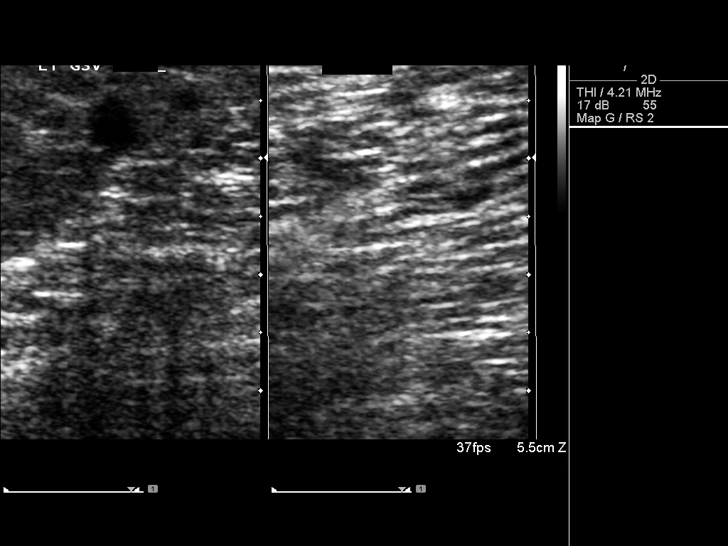
[im 15/34]
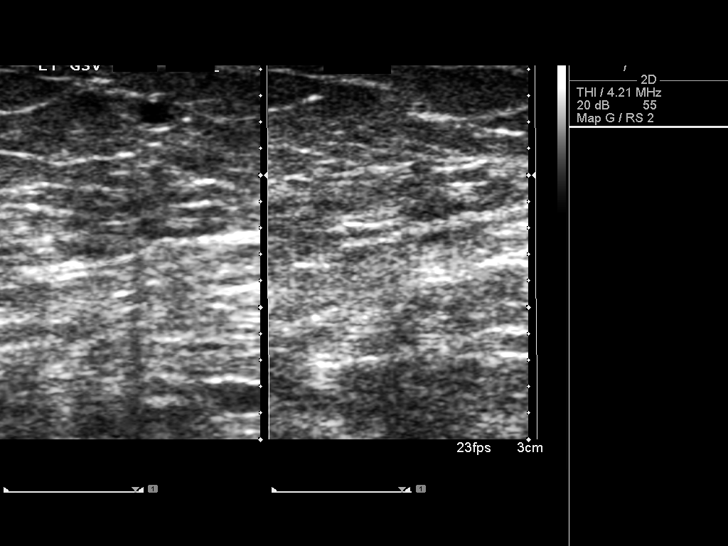
[im 18/34]
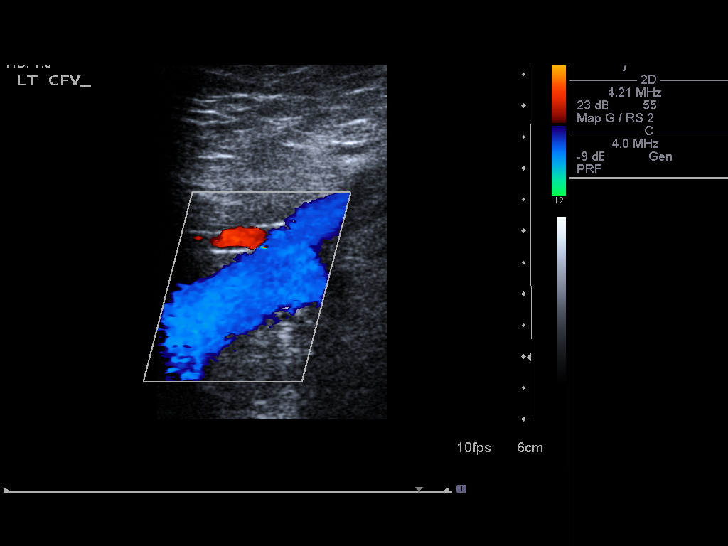
[im 19/34]
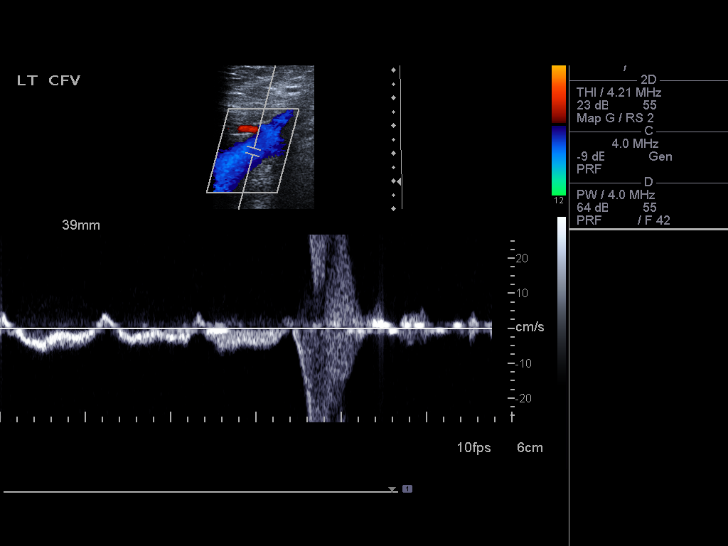
[im 22/34]
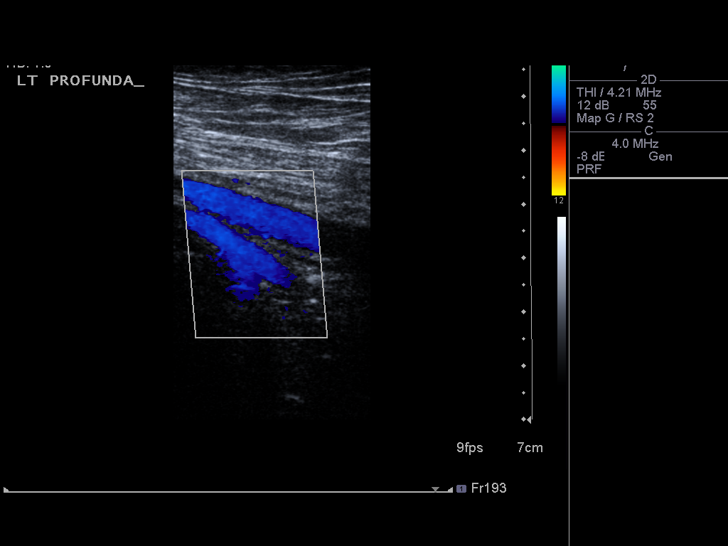
[im 25/34]
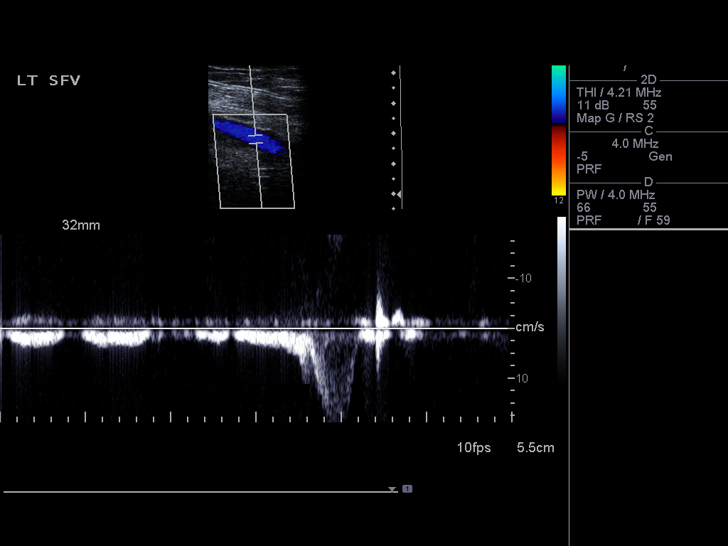
[im 28/34]
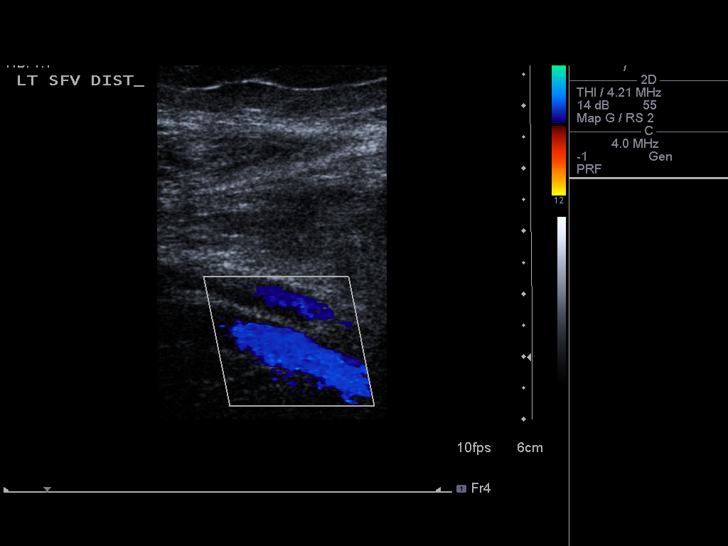
[im 31/34]
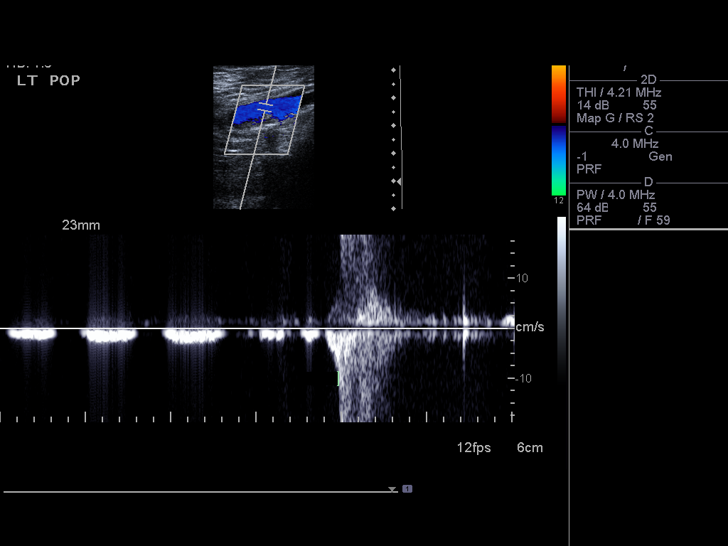
[im 34/34]
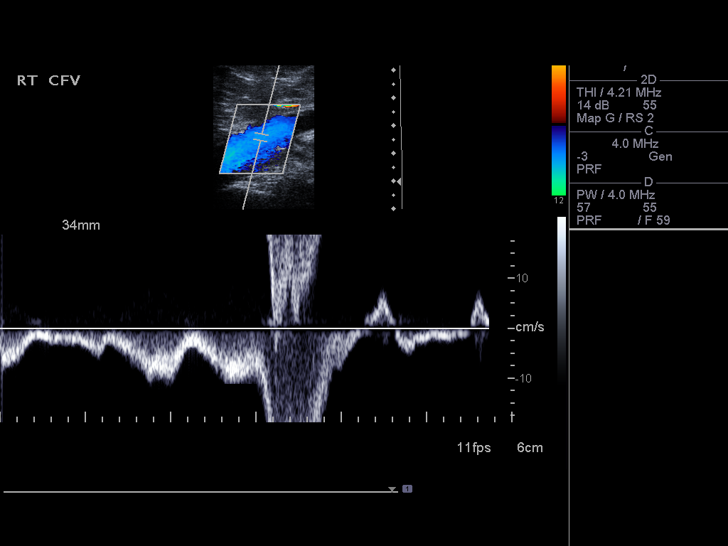

[13 of 24 positions shown; findings below may reference images not displayed]

FINDINGS: Contralateral Common Femoral Vein: Respiratory phasicity is normal
and symmetric with the symptomatic side. No evidence of thrombus.
Normal compressibility.

Common Femoral Vein: No evidence of thrombus. Normal
compressibility, respiratory phasicity and response to augmentation.

Saphenofemoral Junction: No evidence of thrombus. Normal
compressibility and flow on color Doppler imaging.

Profunda Femoral Vein: No evidence of thrombus. Normal
compressibility and flow on color Doppler imaging.

Femoral Vein: No evidence of thrombus. Normal compressibility,
respiratory phasicity and response to augmentation.

Popliteal Vein: No evidence of thrombus. Normal compressibility,
respiratory phasicity and response to augmentation.

Calf Veins: No evidence of thrombus. Normal compressibility and flow
on color Doppler imaging.

Superficial Great Saphenous Vein: No evidence of thrombus. Normal
compressibility and flow on color Doppler imaging.

Venous Reflux:  None.

Other Findings:  None.
IMPRESSION: No evidence of DVT within the left lower extremity.

## 2017-01-23 DIAGNOSIS — I10 Essential (primary) hypertension: Secondary | ICD-10-CM | POA: Diagnosis not present

## 2017-01-23 DIAGNOSIS — R4701 Aphasia: Secondary | ICD-10-CM | POA: Diagnosis not present

## 2017-01-23 DIAGNOSIS — S065X0S Traumatic subdural hemorrhage without loss of consciousness, sequela: Secondary | ICD-10-CM | POA: Diagnosis not present

## 2017-01-23 DIAGNOSIS — W19XXXD Unspecified fall, subsequent encounter: Secondary | ICD-10-CM | POA: Diagnosis not present

## 2017-01-23 DIAGNOSIS — R4189 Other symptoms and signs involving cognitive functions and awareness: Secondary | ICD-10-CM | POA: Diagnosis not present

## 2017-01-24 DIAGNOSIS — I10 Essential (primary) hypertension: Secondary | ICD-10-CM | POA: Diagnosis not present

## 2017-01-24 DIAGNOSIS — W19XXXD Unspecified fall, subsequent encounter: Secondary | ICD-10-CM | POA: Diagnosis not present

## 2017-01-24 DIAGNOSIS — R4701 Aphasia: Secondary | ICD-10-CM | POA: Diagnosis not present

## 2017-01-24 DIAGNOSIS — R4189 Other symptoms and signs involving cognitive functions and awareness: Secondary | ICD-10-CM | POA: Diagnosis not present

## 2017-01-24 DIAGNOSIS — S065X0S Traumatic subdural hemorrhage without loss of consciousness, sequela: Secondary | ICD-10-CM | POA: Diagnosis not present

## 2017-01-25 DIAGNOSIS — R4701 Aphasia: Secondary | ICD-10-CM | POA: Diagnosis not present

## 2017-01-25 DIAGNOSIS — I10 Essential (primary) hypertension: Secondary | ICD-10-CM | POA: Diagnosis not present

## 2017-01-25 DIAGNOSIS — R4189 Other symptoms and signs involving cognitive functions and awareness: Secondary | ICD-10-CM | POA: Diagnosis not present

## 2017-01-25 DIAGNOSIS — S065X0S Traumatic subdural hemorrhage without loss of consciousness, sequela: Secondary | ICD-10-CM | POA: Diagnosis not present

## 2017-01-25 DIAGNOSIS — W19XXXD Unspecified fall, subsequent encounter: Secondary | ICD-10-CM | POA: Diagnosis not present

## 2017-01-26 DIAGNOSIS — I519 Heart disease, unspecified: Secondary | ICD-10-CM | POA: Diagnosis not present

## 2017-01-26 DIAGNOSIS — I952 Hypotension due to drugs: Secondary | ICD-10-CM | POA: Diagnosis not present

## 2017-01-26 DIAGNOSIS — R4189 Other symptoms and signs involving cognitive functions and awareness: Secondary | ICD-10-CM | POA: Diagnosis not present

## 2017-01-26 DIAGNOSIS — W19XXXD Unspecified fall, subsequent encounter: Secondary | ICD-10-CM | POA: Diagnosis not present

## 2017-01-26 DIAGNOSIS — R4701 Aphasia: Secondary | ICD-10-CM | POA: Diagnosis not present

## 2017-01-26 DIAGNOSIS — I10 Essential (primary) hypertension: Secondary | ICD-10-CM | POA: Diagnosis not present

## 2017-01-26 DIAGNOSIS — I129 Hypertensive chronic kidney disease with stage 1 through stage 4 chronic kidney disease, or unspecified chronic kidney disease: Secondary | ICD-10-CM | POA: Diagnosis not present

## 2017-01-26 DIAGNOSIS — S065X0A Traumatic subdural hemorrhage without loss of consciousness, initial encounter: Secondary | ICD-10-CM | POA: Diagnosis not present

## 2017-01-26 DIAGNOSIS — D649 Anemia, unspecified: Secondary | ICD-10-CM | POA: Diagnosis not present

## 2017-01-26 DIAGNOSIS — S065X0S Traumatic subdural hemorrhage without loss of consciousness, sequela: Secondary | ICD-10-CM | POA: Diagnosis not present

## 2017-01-26 DIAGNOSIS — E228 Other hyperfunction of pituitary gland: Secondary | ICD-10-CM | POA: Diagnosis not present

## 2017-01-27 DIAGNOSIS — W19XXXD Unspecified fall, subsequent encounter: Secondary | ICD-10-CM | POA: Diagnosis not present

## 2017-01-27 DIAGNOSIS — R4189 Other symptoms and signs involving cognitive functions and awareness: Secondary | ICD-10-CM | POA: Diagnosis not present

## 2017-01-27 DIAGNOSIS — R4701 Aphasia: Secondary | ICD-10-CM | POA: Diagnosis not present

## 2017-01-27 DIAGNOSIS — I10 Essential (primary) hypertension: Secondary | ICD-10-CM | POA: Diagnosis not present

## 2017-01-27 DIAGNOSIS — S065X0S Traumatic subdural hemorrhage without loss of consciousness, sequela: Secondary | ICD-10-CM | POA: Diagnosis not present

## 2017-01-28 DIAGNOSIS — W19XXXD Unspecified fall, subsequent encounter: Secondary | ICD-10-CM | POA: Diagnosis not present

## 2017-01-28 DIAGNOSIS — R4189 Other symptoms and signs involving cognitive functions and awareness: Secondary | ICD-10-CM | POA: Diagnosis not present

## 2017-01-28 DIAGNOSIS — I10 Essential (primary) hypertension: Secondary | ICD-10-CM | POA: Diagnosis not present

## 2017-01-28 DIAGNOSIS — S065X0S Traumatic subdural hemorrhage without loss of consciousness, sequela: Secondary | ICD-10-CM | POA: Diagnosis not present

## 2017-01-28 DIAGNOSIS — R4701 Aphasia: Secondary | ICD-10-CM | POA: Diagnosis not present

## 2017-01-29 DIAGNOSIS — I951 Orthostatic hypotension: Secondary | ICD-10-CM | POA: Diagnosis not present

## 2017-01-29 DIAGNOSIS — W19XXXD Unspecified fall, subsequent encounter: Secondary | ICD-10-CM | POA: Diagnosis not present

## 2017-01-29 DIAGNOSIS — N3000 Acute cystitis without hematuria: Secondary | ICD-10-CM | POA: Diagnosis not present

## 2017-01-29 DIAGNOSIS — S065X0S Traumatic subdural hemorrhage without loss of consciousness, sequela: Secondary | ICD-10-CM | POA: Diagnosis not present

## 2017-01-29 DIAGNOSIS — J302 Other seasonal allergic rhinitis: Secondary | ICD-10-CM | POA: Diagnosis not present

## 2017-01-29 DIAGNOSIS — R4701 Aphasia: Secondary | ICD-10-CM | POA: Diagnosis not present

## 2017-01-29 DIAGNOSIS — I10 Essential (primary) hypertension: Secondary | ICD-10-CM | POA: Diagnosis not present

## 2017-01-29 DIAGNOSIS — R4189 Other symptoms and signs involving cognitive functions and awareness: Secondary | ICD-10-CM | POA: Diagnosis not present

## 2017-02-01 DIAGNOSIS — W19XXXD Unspecified fall, subsequent encounter: Secondary | ICD-10-CM | POA: Diagnosis not present

## 2017-02-01 DIAGNOSIS — S065X0S Traumatic subdural hemorrhage without loss of consciousness, sequela: Secondary | ICD-10-CM | POA: Diagnosis not present

## 2017-02-01 DIAGNOSIS — R300 Dysuria: Secondary | ICD-10-CM | POA: Diagnosis not present

## 2017-02-01 DIAGNOSIS — R4189 Other symptoms and signs involving cognitive functions and awareness: Secondary | ICD-10-CM | POA: Diagnosis not present

## 2017-02-01 DIAGNOSIS — R4701 Aphasia: Secondary | ICD-10-CM | POA: Diagnosis not present

## 2017-02-01 DIAGNOSIS — I10 Essential (primary) hypertension: Secondary | ICD-10-CM | POA: Diagnosis not present

## 2017-02-02 DIAGNOSIS — H05122 Orbital myositis, left orbit: Secondary | ICD-10-CM | POA: Diagnosis not present

## 2017-02-02 DIAGNOSIS — S065X0S Traumatic subdural hemorrhage without loss of consciousness, sequela: Secondary | ICD-10-CM | POA: Diagnosis not present

## 2017-02-02 DIAGNOSIS — W19XXXD Unspecified fall, subsequent encounter: Secondary | ICD-10-CM | POA: Diagnosis not present

## 2017-02-02 DIAGNOSIS — I10 Essential (primary) hypertension: Secondary | ICD-10-CM | POA: Diagnosis not present

## 2017-02-02 DIAGNOSIS — R4189 Other symptoms and signs involving cognitive functions and awareness: Secondary | ICD-10-CM | POA: Diagnosis not present

## 2017-02-02 DIAGNOSIS — S0006XD Insect bite (nonvenomous) of scalp, subsequent encounter: Secondary | ICD-10-CM | POA: Diagnosis not present

## 2017-02-02 DIAGNOSIS — N3000 Acute cystitis without hematuria: Secondary | ICD-10-CM | POA: Diagnosis not present

## 2017-02-02 DIAGNOSIS — R4701 Aphasia: Secondary | ICD-10-CM | POA: Diagnosis not present

## 2017-02-03 DIAGNOSIS — R4189 Other symptoms and signs involving cognitive functions and awareness: Secondary | ICD-10-CM | POA: Diagnosis not present

## 2017-02-03 DIAGNOSIS — W19XXXD Unspecified fall, subsequent encounter: Secondary | ICD-10-CM | POA: Diagnosis not present

## 2017-02-03 DIAGNOSIS — S065X0S Traumatic subdural hemorrhage without loss of consciousness, sequela: Secondary | ICD-10-CM | POA: Diagnosis not present

## 2017-02-03 DIAGNOSIS — R4701 Aphasia: Secondary | ICD-10-CM | POA: Diagnosis not present

## 2017-02-03 DIAGNOSIS — I10 Essential (primary) hypertension: Secondary | ICD-10-CM | POA: Diagnosis not present

## 2017-02-04 DIAGNOSIS — R4189 Other symptoms and signs involving cognitive functions and awareness: Secondary | ICD-10-CM | POA: Diagnosis not present

## 2017-02-04 DIAGNOSIS — R4701 Aphasia: Secondary | ICD-10-CM | POA: Diagnosis not present

## 2017-02-04 DIAGNOSIS — I10 Essential (primary) hypertension: Secondary | ICD-10-CM | POA: Diagnosis not present

## 2017-02-04 DIAGNOSIS — S065X0S Traumatic subdural hemorrhage without loss of consciousness, sequela: Secondary | ICD-10-CM | POA: Diagnosis not present

## 2017-02-04 DIAGNOSIS — W19XXXD Unspecified fall, subsequent encounter: Secondary | ICD-10-CM | POA: Diagnosis not present

## 2017-02-05 DIAGNOSIS — S065X0S Traumatic subdural hemorrhage without loss of consciousness, sequela: Secondary | ICD-10-CM | POA: Diagnosis not present

## 2017-02-05 DIAGNOSIS — R4701 Aphasia: Secondary | ICD-10-CM | POA: Diagnosis not present

## 2017-02-05 DIAGNOSIS — W19XXXD Unspecified fall, subsequent encounter: Secondary | ICD-10-CM | POA: Diagnosis not present

## 2017-02-05 DIAGNOSIS — I10 Essential (primary) hypertension: Secondary | ICD-10-CM | POA: Diagnosis not present

## 2017-02-05 DIAGNOSIS — R4189 Other symptoms and signs involving cognitive functions and awareness: Secondary | ICD-10-CM | POA: Diagnosis not present

## 2017-02-08 DIAGNOSIS — S065X0S Traumatic subdural hemorrhage without loss of consciousness, sequela: Secondary | ICD-10-CM | POA: Diagnosis not present

## 2017-02-08 DIAGNOSIS — I10 Essential (primary) hypertension: Secondary | ICD-10-CM | POA: Diagnosis not present

## 2017-02-08 DIAGNOSIS — R4701 Aphasia: Secondary | ICD-10-CM | POA: Diagnosis not present

## 2017-02-08 DIAGNOSIS — R4189 Other symptoms and signs involving cognitive functions and awareness: Secondary | ICD-10-CM | POA: Diagnosis not present

## 2017-02-08 DIAGNOSIS — W19XXXD Unspecified fall, subsequent encounter: Secondary | ICD-10-CM | POA: Diagnosis not present

## 2017-02-09 DIAGNOSIS — R4189 Other symptoms and signs involving cognitive functions and awareness: Secondary | ICD-10-CM | POA: Diagnosis not present

## 2017-02-09 DIAGNOSIS — R4701 Aphasia: Secondary | ICD-10-CM | POA: Diagnosis not present

## 2017-02-09 DIAGNOSIS — I10 Essential (primary) hypertension: Secondary | ICD-10-CM | POA: Diagnosis not present

## 2017-02-09 DIAGNOSIS — R609 Edema, unspecified: Secondary | ICD-10-CM | POA: Diagnosis not present

## 2017-02-09 DIAGNOSIS — S065X0S Traumatic subdural hemorrhage without loss of consciousness, sequela: Secondary | ICD-10-CM | POA: Diagnosis not present

## 2017-02-09 DIAGNOSIS — B351 Tinea unguium: Secondary | ICD-10-CM | POA: Diagnosis not present

## 2017-02-09 DIAGNOSIS — W19XXXD Unspecified fall, subsequent encounter: Secondary | ICD-10-CM | POA: Diagnosis not present

## 2017-02-09 DIAGNOSIS — S0083XD Contusion of other part of head, subsequent encounter: Secondary | ICD-10-CM | POA: Diagnosis not present

## 2017-02-10 DIAGNOSIS — W19XXXD Unspecified fall, subsequent encounter: Secondary | ICD-10-CM | POA: Diagnosis not present

## 2017-02-10 DIAGNOSIS — S065X0S Traumatic subdural hemorrhage without loss of consciousness, sequela: Secondary | ICD-10-CM | POA: Diagnosis not present

## 2017-02-10 DIAGNOSIS — R4189 Other symptoms and signs involving cognitive functions and awareness: Secondary | ICD-10-CM | POA: Diagnosis not present

## 2017-02-10 DIAGNOSIS — I10 Essential (primary) hypertension: Secondary | ICD-10-CM | POA: Diagnosis not present

## 2017-02-10 DIAGNOSIS — R4701 Aphasia: Secondary | ICD-10-CM | POA: Diagnosis not present

## 2017-02-11 DIAGNOSIS — N3020 Other chronic cystitis without hematuria: Secondary | ICD-10-CM | POA: Diagnosis not present

## 2017-02-11 DIAGNOSIS — R4189 Other symptoms and signs involving cognitive functions and awareness: Secondary | ICD-10-CM | POA: Diagnosis not present

## 2017-02-11 DIAGNOSIS — W19XXXD Unspecified fall, subsequent encounter: Secondary | ICD-10-CM | POA: Diagnosis not present

## 2017-02-11 DIAGNOSIS — N281 Cyst of kidney, acquired: Secondary | ICD-10-CM | POA: Diagnosis not present

## 2017-02-11 DIAGNOSIS — N261 Atrophy of kidney (terminal): Secondary | ICD-10-CM | POA: Diagnosis not present

## 2017-02-11 DIAGNOSIS — Q600 Renal agenesis, unilateral: Secondary | ICD-10-CM | POA: Diagnosis not present

## 2017-02-11 DIAGNOSIS — R4701 Aphasia: Secondary | ICD-10-CM | POA: Diagnosis not present

## 2017-02-11 DIAGNOSIS — R300 Dysuria: Secondary | ICD-10-CM | POA: Diagnosis not present

## 2017-02-11 DIAGNOSIS — I10 Essential (primary) hypertension: Secondary | ICD-10-CM | POA: Diagnosis not present

## 2017-02-11 DIAGNOSIS — S065X0S Traumatic subdural hemorrhage without loss of consciousness, sequela: Secondary | ICD-10-CM | POA: Diagnosis not present

## 2017-02-12 DIAGNOSIS — R4701 Aphasia: Secondary | ICD-10-CM | POA: Diagnosis not present

## 2017-02-12 DIAGNOSIS — S065X0S Traumatic subdural hemorrhage without loss of consciousness, sequela: Secondary | ICD-10-CM | POA: Diagnosis not present

## 2017-02-12 DIAGNOSIS — R4189 Other symptoms and signs involving cognitive functions and awareness: Secondary | ICD-10-CM | POA: Diagnosis not present

## 2017-02-12 DIAGNOSIS — W19XXXD Unspecified fall, subsequent encounter: Secondary | ICD-10-CM | POA: Diagnosis not present

## 2017-02-12 DIAGNOSIS — I10 Essential (primary) hypertension: Secondary | ICD-10-CM | POA: Diagnosis not present

## 2017-02-15 DIAGNOSIS — S0003XA Contusion of scalp, initial encounter: Secondary | ICD-10-CM | POA: Diagnosis not present

## 2017-02-15 DIAGNOSIS — W19XXXD Unspecified fall, subsequent encounter: Secondary | ICD-10-CM | POA: Diagnosis not present

## 2017-02-15 DIAGNOSIS — I10 Essential (primary) hypertension: Secondary | ICD-10-CM | POA: Diagnosis not present

## 2017-02-15 DIAGNOSIS — I6200 Nontraumatic subdural hemorrhage, unspecified: Secondary | ICD-10-CM | POA: Diagnosis not present

## 2017-02-15 DIAGNOSIS — W010XXA Fall on same level from slipping, tripping and stumbling without subsequent striking against object, initial encounter: Secondary | ICD-10-CM | POA: Diagnosis not present

## 2017-02-15 DIAGNOSIS — M5030 Other cervical disc degeneration, unspecified cervical region: Secondary | ICD-10-CM | POA: Diagnosis not present

## 2017-02-15 DIAGNOSIS — R4701 Aphasia: Secondary | ICD-10-CM | POA: Diagnosis not present

## 2017-02-15 DIAGNOSIS — R51 Headache: Secondary | ICD-10-CM | POA: Diagnosis not present

## 2017-02-15 DIAGNOSIS — S199XXA Unspecified injury of neck, initial encounter: Secondary | ICD-10-CM | POA: Diagnosis not present

## 2017-02-15 DIAGNOSIS — S065X0S Traumatic subdural hemorrhage without loss of consciousness, sequela: Secondary | ICD-10-CM | POA: Diagnosis not present

## 2017-02-15 DIAGNOSIS — W19XXXA Unspecified fall, initial encounter: Secondary | ICD-10-CM | POA: Diagnosis not present

## 2017-02-15 DIAGNOSIS — R220 Localized swelling, mass and lump, head: Secondary | ICD-10-CM | POA: Diagnosis not present

## 2017-02-15 DIAGNOSIS — R4189 Other symptoms and signs involving cognitive functions and awareness: Secondary | ICD-10-CM | POA: Diagnosis not present

## 2017-02-15 DIAGNOSIS — M47892 Other spondylosis, cervical region: Secondary | ICD-10-CM | POA: Diagnosis not present

## 2017-02-16 DIAGNOSIS — S060X0D Concussion without loss of consciousness, subsequent encounter: Secondary | ICD-10-CM | POA: Diagnosis not present

## 2017-02-16 DIAGNOSIS — S0000XD Unspecified superficial injury of scalp, subsequent encounter: Secondary | ICD-10-CM | POA: Diagnosis not present

## 2017-02-16 DIAGNOSIS — I10 Essential (primary) hypertension: Secondary | ICD-10-CM | POA: Diagnosis not present

## 2017-02-16 DIAGNOSIS — T148XXA Other injury of unspecified body region, initial encounter: Secondary | ICD-10-CM | POA: Diagnosis not present

## 2017-02-17 DIAGNOSIS — R4189 Other symptoms and signs involving cognitive functions and awareness: Secondary | ICD-10-CM | POA: Diagnosis not present

## 2017-02-17 DIAGNOSIS — R4701 Aphasia: Secondary | ICD-10-CM | POA: Diagnosis not present

## 2017-02-17 DIAGNOSIS — I10 Essential (primary) hypertension: Secondary | ICD-10-CM | POA: Diagnosis not present

## 2017-02-17 DIAGNOSIS — W19XXXD Unspecified fall, subsequent encounter: Secondary | ICD-10-CM | POA: Diagnosis not present

## 2017-02-17 DIAGNOSIS — S065X0S Traumatic subdural hemorrhage without loss of consciousness, sequela: Secondary | ICD-10-CM | POA: Diagnosis not present

## 2017-02-19 DIAGNOSIS — R4701 Aphasia: Secondary | ICD-10-CM | POA: Diagnosis not present

## 2017-02-19 DIAGNOSIS — S065X0S Traumatic subdural hemorrhage without loss of consciousness, sequela: Secondary | ICD-10-CM | POA: Diagnosis not present

## 2017-02-19 DIAGNOSIS — W19XXXD Unspecified fall, subsequent encounter: Secondary | ICD-10-CM | POA: Diagnosis not present

## 2017-02-19 DIAGNOSIS — I10 Essential (primary) hypertension: Secondary | ICD-10-CM | POA: Diagnosis not present

## 2017-02-19 DIAGNOSIS — R4189 Other symptoms and signs involving cognitive functions and awareness: Secondary | ICD-10-CM | POA: Diagnosis not present

## 2017-02-22 DIAGNOSIS — R4189 Other symptoms and signs involving cognitive functions and awareness: Secondary | ICD-10-CM | POA: Diagnosis not present

## 2017-02-22 DIAGNOSIS — I10 Essential (primary) hypertension: Secondary | ICD-10-CM | POA: Diagnosis not present

## 2017-02-22 DIAGNOSIS — R4701 Aphasia: Secondary | ICD-10-CM | POA: Diagnosis not present

## 2017-02-22 DIAGNOSIS — S065X0S Traumatic subdural hemorrhage without loss of consciousness, sequela: Secondary | ICD-10-CM | POA: Diagnosis not present

## 2017-02-22 DIAGNOSIS — W19XXXD Unspecified fall, subsequent encounter: Secondary | ICD-10-CM | POA: Diagnosis not present

## 2017-02-23 DIAGNOSIS — I998 Other disorder of circulatory system: Secondary | ICD-10-CM | POA: Diagnosis not present

## 2017-02-23 DIAGNOSIS — R531 Weakness: Secondary | ICD-10-CM | POA: Diagnosis not present

## 2017-02-23 DIAGNOSIS — M6281 Muscle weakness (generalized): Secondary | ICD-10-CM | POA: Diagnosis not present

## 2017-02-23 DIAGNOSIS — L89151 Pressure ulcer of sacral region, stage 1: Secondary | ICD-10-CM | POA: Diagnosis not present

## 2017-02-23 DIAGNOSIS — I10 Essential (primary) hypertension: Secondary | ICD-10-CM | POA: Diagnosis not present

## 2017-02-23 DIAGNOSIS — N189 Chronic kidney disease, unspecified: Secondary | ICD-10-CM | POA: Diagnosis not present

## 2017-02-23 DIAGNOSIS — R5383 Other fatigue: Secondary | ICD-10-CM | POA: Diagnosis not present

## 2017-02-23 DIAGNOSIS — R51 Headache: Secondary | ICD-10-CM | POA: Diagnosis not present

## 2017-02-23 DIAGNOSIS — I129 Hypertensive chronic kidney disease with stage 1 through stage 4 chronic kidney disease, or unspecified chronic kidney disease: Secondary | ICD-10-CM | POA: Diagnosis not present

## 2017-02-23 DIAGNOSIS — E222 Syndrome of inappropriate secretion of antidiuretic hormone: Secondary | ICD-10-CM | POA: Diagnosis not present

## 2017-02-23 DIAGNOSIS — E871 Hypo-osmolality and hyponatremia: Secondary | ICD-10-CM | POA: Diagnosis not present

## 2017-02-23 DIAGNOSIS — Z111 Encounter for screening for respiratory tuberculosis: Secondary | ICD-10-CM | POA: Diagnosis not present

## 2017-02-23 DIAGNOSIS — I519 Heart disease, unspecified: Secondary | ICD-10-CM | POA: Diagnosis not present

## 2017-02-23 DIAGNOSIS — N181 Chronic kidney disease, stage 1: Secondary | ICD-10-CM | POA: Diagnosis not present

## 2017-02-23 DIAGNOSIS — M533 Sacrococcygeal disorders, not elsewhere classified: Secondary | ICD-10-CM | POA: Diagnosis not present

## 2017-02-23 DIAGNOSIS — R110 Nausea: Secondary | ICD-10-CM | POA: Diagnosis not present

## 2017-02-23 DIAGNOSIS — H5509 Other forms of nystagmus: Secondary | ICD-10-CM | POA: Diagnosis not present

## 2017-02-23 DIAGNOSIS — R5381 Other malaise: Secondary | ICD-10-CM | POA: Diagnosis not present

## 2017-02-23 DIAGNOSIS — R81 Glycosuria: Secondary | ICD-10-CM | POA: Diagnosis not present

## 2017-02-23 DIAGNOSIS — J309 Allergic rhinitis, unspecified: Secondary | ICD-10-CM | POA: Diagnosis not present

## 2017-02-23 DIAGNOSIS — R42 Dizziness and giddiness: Secondary | ICD-10-CM | POA: Diagnosis not present

## 2017-02-23 DIAGNOSIS — R278 Other lack of coordination: Secondary | ICD-10-CM | POA: Diagnosis not present

## 2017-02-23 DIAGNOSIS — E785 Hyperlipidemia, unspecified: Secondary | ICD-10-CM | POA: Diagnosis not present

## 2017-02-23 DIAGNOSIS — M47816 Spondylosis without myelopathy or radiculopathy, lumbar region: Secondary | ICD-10-CM | POA: Diagnosis not present

## 2017-02-23 DIAGNOSIS — G4700 Insomnia, unspecified: Secondary | ICD-10-CM | POA: Diagnosis not present

## 2017-02-23 DIAGNOSIS — J3089 Other allergic rhinitis: Secondary | ICD-10-CM | POA: Diagnosis not present

## 2017-02-23 DIAGNOSIS — N186 End stage renal disease: Secondary | ICD-10-CM | POA: Diagnosis not present

## 2017-02-23 DIAGNOSIS — W19XXXD Unspecified fall, subsequent encounter: Secondary | ICD-10-CM | POA: Diagnosis not present

## 2017-02-23 DIAGNOSIS — R937 Abnormal findings on diagnostic imaging of other parts of musculoskeletal system: Secondary | ICD-10-CM | POA: Diagnosis not present

## 2017-02-23 DIAGNOSIS — H5500 Unspecified nystagmus: Secondary | ICD-10-CM | POA: Diagnosis not present

## 2017-02-23 DIAGNOSIS — R12 Heartburn: Secondary | ICD-10-CM | POA: Diagnosis not present

## 2017-02-23 DIAGNOSIS — G8929 Other chronic pain: Secondary | ICD-10-CM | POA: Diagnosis not present

## 2017-02-23 DIAGNOSIS — Z9889 Other specified postprocedural states: Secondary | ICD-10-CM | POA: Diagnosis not present

## 2017-02-23 DIAGNOSIS — H04129 Dry eye syndrome of unspecified lacrimal gland: Secondary | ICD-10-CM | POA: Diagnosis not present

## 2017-02-23 DIAGNOSIS — D649 Anemia, unspecified: Secondary | ICD-10-CM | POA: Diagnosis not present

## 2017-02-23 DIAGNOSIS — H538 Other visual disturbances: Secondary | ICD-10-CM | POA: Diagnosis not present

## 2017-02-23 DIAGNOSIS — R300 Dysuria: Secondary | ICD-10-CM | POA: Diagnosis not present

## 2017-02-23 DIAGNOSIS — D509 Iron deficiency anemia, unspecified: Secondary | ICD-10-CM | POA: Diagnosis not present

## 2017-02-23 DIAGNOSIS — F419 Anxiety disorder, unspecified: Secondary | ICD-10-CM | POA: Diagnosis not present

## 2017-02-23 DIAGNOSIS — K921 Melena: Secondary | ICD-10-CM | POA: Diagnosis not present

## 2017-02-23 DIAGNOSIS — R404 Transient alteration of awareness: Secondary | ICD-10-CM | POA: Diagnosis not present

## 2017-02-23 DIAGNOSIS — N390 Urinary tract infection, site not specified: Secondary | ICD-10-CM | POA: Diagnosis not present

## 2017-02-23 DIAGNOSIS — R911 Solitary pulmonary nodule: Secondary | ICD-10-CM | POA: Diagnosis not present

## 2017-02-23 DIAGNOSIS — Z7982 Long term (current) use of aspirin: Secondary | ICD-10-CM | POA: Diagnosis not present

## 2017-02-23 DIAGNOSIS — R29810 Facial weakness: Secondary | ICD-10-CM | POA: Diagnosis present

## 2017-02-23 DIAGNOSIS — Z79899 Other long term (current) drug therapy: Secondary | ICD-10-CM | POA: Diagnosis not present

## 2017-02-23 DIAGNOSIS — N184 Chronic kidney disease, stage 4 (severe): Secondary | ICD-10-CM | POA: Diagnosis present

## 2017-02-23 DIAGNOSIS — I6200 Nontraumatic subdural hemorrhage, unspecified: Secondary | ICD-10-CM | POA: Diagnosis not present

## 2017-02-23 DIAGNOSIS — M1612 Unilateral primary osteoarthritis, left hip: Secondary | ICD-10-CM | POA: Diagnosis not present

## 2017-02-23 DIAGNOSIS — R52 Pain, unspecified: Secondary | ICD-10-CM | POA: Diagnosis not present

## 2017-02-23 DIAGNOSIS — K5900 Constipation, unspecified: Secondary | ICD-10-CM | POA: Diagnosis not present

## 2017-02-23 DIAGNOSIS — R739 Hyperglycemia, unspecified: Secondary | ICD-10-CM | POA: Diagnosis not present

## 2017-02-23 DIAGNOSIS — R918 Other nonspecific abnormal finding of lung field: Secondary | ICD-10-CM | POA: Diagnosis not present

## 2017-02-23 DIAGNOSIS — R262 Difficulty in walking, not elsewhere classified: Secondary | ICD-10-CM | POA: Diagnosis not present

## 2017-02-23 DIAGNOSIS — Y92129 Unspecified place in nursing home as the place of occurrence of the external cause: Secondary | ICD-10-CM | POA: Diagnosis not present

## 2017-02-24 DIAGNOSIS — D649 Anemia, unspecified: Secondary | ICD-10-CM | POA: Diagnosis not present

## 2017-02-24 DIAGNOSIS — I519 Heart disease, unspecified: Secondary | ICD-10-CM | POA: Diagnosis not present

## 2017-02-24 DIAGNOSIS — I10 Essential (primary) hypertension: Secondary | ICD-10-CM | POA: Diagnosis not present

## 2017-03-06 DIAGNOSIS — E785 Hyperlipidemia, unspecified: Secondary | ICD-10-CM | POA: Diagnosis not present

## 2017-03-06 DIAGNOSIS — R278 Other lack of coordination: Secondary | ICD-10-CM | POA: Diagnosis not present

## 2017-03-06 DIAGNOSIS — R52 Pain, unspecified: Secondary | ICD-10-CM | POA: Diagnosis not present

## 2017-03-06 DIAGNOSIS — D649 Anemia, unspecified: Secondary | ICD-10-CM | POA: Diagnosis not present

## 2017-03-06 DIAGNOSIS — M6281 Muscle weakness (generalized): Secondary | ICD-10-CM | POA: Diagnosis not present

## 2017-03-06 DIAGNOSIS — I10 Essential (primary) hypertension: Secondary | ICD-10-CM | POA: Diagnosis not present

## 2017-03-06 DIAGNOSIS — J3089 Other allergic rhinitis: Secondary | ICD-10-CM | POA: Diagnosis not present

## 2017-03-06 DIAGNOSIS — E43 Unspecified severe protein-calorie malnutrition: Secondary | ICD-10-CM | POA: Diagnosis not present

## 2017-03-06 DIAGNOSIS — R262 Difficulty in walking, not elsewhere classified: Secondary | ICD-10-CM | POA: Diagnosis not present

## 2017-03-06 DIAGNOSIS — K5900 Constipation, unspecified: Secondary | ICD-10-CM | POA: Diagnosis not present

## 2017-03-06 DIAGNOSIS — D509 Iron deficiency anemia, unspecified: Secondary | ICD-10-CM | POA: Diagnosis not present

## 2017-03-06 DIAGNOSIS — I6200 Nontraumatic subdural hemorrhage, unspecified: Secondary | ICD-10-CM | POA: Diagnosis not present

## 2017-03-06 DIAGNOSIS — G8929 Other chronic pain: Secondary | ICD-10-CM | POA: Diagnosis not present

## 2017-03-06 DIAGNOSIS — F419 Anxiety disorder, unspecified: Secondary | ICD-10-CM | POA: Diagnosis not present

## 2017-03-06 DIAGNOSIS — T7840XA Allergy, unspecified, initial encounter: Secondary | ICD-10-CM | POA: Diagnosis not present

## 2017-03-06 DIAGNOSIS — H6120 Impacted cerumen, unspecified ear: Secondary | ICD-10-CM | POA: Diagnosis not present

## 2017-03-06 DIAGNOSIS — R42 Dizziness and giddiness: Secondary | ICD-10-CM | POA: Diagnosis not present

## 2017-03-06 DIAGNOSIS — R12 Heartburn: Secondary | ICD-10-CM | POA: Diagnosis not present

## 2017-03-06 DIAGNOSIS — Z111 Encounter for screening for respiratory tuberculosis: Secondary | ICD-10-CM | POA: Diagnosis not present

## 2017-03-06 DIAGNOSIS — N181 Chronic kidney disease, stage 1: Secondary | ICD-10-CM | POA: Diagnosis not present

## 2017-03-06 DIAGNOSIS — G4700 Insomnia, unspecified: Secondary | ICD-10-CM | POA: Diagnosis not present

## 2017-03-06 DIAGNOSIS — H04129 Dry eye syndrome of unspecified lacrimal gland: Secondary | ICD-10-CM | POA: Diagnosis not present

## 2017-03-06 DIAGNOSIS — H5500 Unspecified nystagmus: Secondary | ICD-10-CM | POA: Diagnosis not present

## 2017-03-06 DIAGNOSIS — H722X1 Other marginal perforations of tympanic membrane, right ear: Secondary | ICD-10-CM | POA: Diagnosis not present

## 2017-03-06 DIAGNOSIS — N390 Urinary tract infection, site not specified: Secondary | ICD-10-CM | POA: Diagnosis not present

## 2017-03-06 DIAGNOSIS — E871 Hypo-osmolality and hyponatremia: Secondary | ICD-10-CM | POA: Diagnosis not present

## 2017-03-09 DIAGNOSIS — E785 Hyperlipidemia, unspecified: Secondary | ICD-10-CM | POA: Diagnosis not present

## 2017-03-09 DIAGNOSIS — H04129 Dry eye syndrome of unspecified lacrimal gland: Secondary | ICD-10-CM | POA: Diagnosis not present

## 2017-03-09 DIAGNOSIS — K5900 Constipation, unspecified: Secondary | ICD-10-CM | POA: Diagnosis not present

## 2017-03-09 DIAGNOSIS — I10 Essential (primary) hypertension: Secondary | ICD-10-CM | POA: Diagnosis not present

## 2017-03-09 DIAGNOSIS — G8929 Other chronic pain: Secondary | ICD-10-CM | POA: Diagnosis not present

## 2017-03-09 DIAGNOSIS — D649 Anemia, unspecified: Secondary | ICD-10-CM | POA: Diagnosis not present

## 2017-03-09 DIAGNOSIS — R42 Dizziness and giddiness: Secondary | ICD-10-CM | POA: Diagnosis not present

## 2017-03-09 DIAGNOSIS — J3089 Other allergic rhinitis: Secondary | ICD-10-CM | POA: Diagnosis not present

## 2017-03-09 DIAGNOSIS — F419 Anxiety disorder, unspecified: Secondary | ICD-10-CM | POA: Diagnosis not present

## 2017-03-09 DIAGNOSIS — G4700 Insomnia, unspecified: Secondary | ICD-10-CM | POA: Diagnosis not present

## 2017-03-09 DIAGNOSIS — E43 Unspecified severe protein-calorie malnutrition: Secondary | ICD-10-CM | POA: Diagnosis not present

## 2017-03-09 DIAGNOSIS — N181 Chronic kidney disease, stage 1: Secondary | ICD-10-CM | POA: Diagnosis not present

## 2017-03-17 DIAGNOSIS — H722X1 Other marginal perforations of tympanic membrane, right ear: Secondary | ICD-10-CM | POA: Diagnosis not present

## 2017-03-17 DIAGNOSIS — K5900 Constipation, unspecified: Secondary | ICD-10-CM | POA: Diagnosis not present

## 2017-03-17 DIAGNOSIS — T7840XA Allergy, unspecified, initial encounter: Secondary | ICD-10-CM | POA: Diagnosis not present

## 2017-03-17 DIAGNOSIS — H6120 Impacted cerumen, unspecified ear: Secondary | ICD-10-CM | POA: Diagnosis not present

## 2017-03-18 DIAGNOSIS — N181 Chronic kidney disease, stage 1: Secondary | ICD-10-CM | POA: Diagnosis not present

## 2017-03-18 DIAGNOSIS — R42 Dizziness and giddiness: Secondary | ICD-10-CM | POA: Diagnosis not present

## 2017-03-18 DIAGNOSIS — G8929 Other chronic pain: Secondary | ICD-10-CM | POA: Diagnosis not present

## 2017-03-18 DIAGNOSIS — K5900 Constipation, unspecified: Secondary | ICD-10-CM | POA: Diagnosis not present

## 2017-03-18 DIAGNOSIS — E785 Hyperlipidemia, unspecified: Secondary | ICD-10-CM | POA: Diagnosis not present

## 2017-03-18 DIAGNOSIS — H04129 Dry eye syndrome of unspecified lacrimal gland: Secondary | ICD-10-CM | POA: Diagnosis not present

## 2017-03-18 DIAGNOSIS — I10 Essential (primary) hypertension: Secondary | ICD-10-CM | POA: Diagnosis not present

## 2017-03-18 DIAGNOSIS — D649 Anemia, unspecified: Secondary | ICD-10-CM | POA: Diagnosis not present

## 2017-03-18 DIAGNOSIS — E43 Unspecified severe protein-calorie malnutrition: Secondary | ICD-10-CM | POA: Diagnosis not present

## 2017-03-18 DIAGNOSIS — J3089 Other allergic rhinitis: Secondary | ICD-10-CM | POA: Diagnosis not present

## 2017-03-18 DIAGNOSIS — G4700 Insomnia, unspecified: Secondary | ICD-10-CM | POA: Diagnosis not present

## 2017-03-18 DIAGNOSIS — F419 Anxiety disorder, unspecified: Secondary | ICD-10-CM | POA: Diagnosis not present

## 2017-03-29 DIAGNOSIS — Z79899 Other long term (current) drug therapy: Secondary | ICD-10-CM | POA: Diagnosis not present

## 2017-03-29 DIAGNOSIS — S065X9A Traumatic subdural hemorrhage with loss of consciousness of unspecified duration, initial encounter: Secondary | ICD-10-CM | POA: Diagnosis not present

## 2017-03-29 DIAGNOSIS — I10 Essential (primary) hypertension: Secondary | ICD-10-CM | POA: Diagnosis not present

## 2017-03-29 DIAGNOSIS — J301 Allergic rhinitis due to pollen: Secondary | ICD-10-CM | POA: Diagnosis not present

## 2017-03-29 DIAGNOSIS — Z23 Encounter for immunization: Secondary | ICD-10-CM | POA: Diagnosis not present

## 2017-04-07 ENCOUNTER — Ambulatory Visit: Payer: Federal, State, Local not specified - PPO | Admitting: Certified Nurse Midwife

## 2017-04-14 DIAGNOSIS — Z9889 Other specified postprocedural states: Secondary | ICD-10-CM | POA: Diagnosis not present

## 2017-04-14 DIAGNOSIS — R930 Abnormal findings on diagnostic imaging of skull and head, not elsewhere classified: Secondary | ICD-10-CM | POA: Diagnosis not present

## 2017-04-14 DIAGNOSIS — J310 Chronic rhinitis: Secondary | ICD-10-CM | POA: Diagnosis not present

## 2017-04-14 DIAGNOSIS — R29818 Other symptoms and signs involving the nervous system: Secondary | ICD-10-CM | POA: Diagnosis not present

## 2017-04-14 DIAGNOSIS — Z79899 Other long term (current) drug therapy: Secondary | ICD-10-CM | POA: Diagnosis not present

## 2017-04-14 DIAGNOSIS — H903 Sensorineural hearing loss, bilateral: Secondary | ICD-10-CM | POA: Diagnosis not present

## 2017-04-28 DIAGNOSIS — H47391 Other disorders of optic disc, right eye: Secondary | ICD-10-CM | POA: Diagnosis not present

## 2017-04-28 DIAGNOSIS — H04123 Dry eye syndrome of bilateral lacrimal glands: Secondary | ICD-10-CM | POA: Diagnosis not present

## 2017-04-28 DIAGNOSIS — H40013 Open angle with borderline findings, low risk, bilateral: Secondary | ICD-10-CM | POA: Diagnosis not present

## 2017-04-28 DIAGNOSIS — H18453 Nodular corneal degeneration, bilateral: Secondary | ICD-10-CM | POA: Diagnosis not present

## 2017-05-13 ENCOUNTER — Telehealth: Payer: Self-pay | Admitting: Certified Nurse Midwife

## 2017-05-13 NOTE — Telephone Encounter (Signed)
Patient called and cancelled her AEX with Melvia Heaps, CNM on 05/14/17 because her daughter is in the hospital and cannot bring her. She rescheduled to 06/09/17.

## 2017-05-14 ENCOUNTER — Ambulatory Visit: Payer: Federal, State, Local not specified - PPO | Admitting: Certified Nurse Midwife

## 2017-05-19 DIAGNOSIS — R399 Unspecified symptoms and signs involving the genitourinary system: Secondary | ICD-10-CM | POA: Diagnosis not present

## 2017-06-02 DIAGNOSIS — Z87440 Personal history of urinary (tract) infections: Secondary | ICD-10-CM | POA: Diagnosis not present

## 2017-06-09 ENCOUNTER — Encounter: Payer: Self-pay | Admitting: Certified Nurse Midwife

## 2017-06-09 ENCOUNTER — Ambulatory Visit (INDEPENDENT_AMBULATORY_CARE_PROVIDER_SITE_OTHER): Payer: Medicare Other | Admitting: Certified Nurse Midwife

## 2017-06-09 ENCOUNTER — Other Ambulatory Visit: Payer: Self-pay

## 2017-06-09 VITALS — BP 118/70 | HR 68 | Resp 16 | Ht 60.0 in | Wt 135.0 lb

## 2017-06-09 DIAGNOSIS — Z01419 Encounter for gynecological examination (general) (routine) without abnormal findings: Secondary | ICD-10-CM | POA: Diagnosis not present

## 2017-06-09 DIAGNOSIS — M797 Fibromyalgia: Secondary | ICD-10-CM | POA: Insufficient documentation

## 2017-06-09 DIAGNOSIS — Z780 Asymptomatic menopausal state: Secondary | ICD-10-CM

## 2017-06-09 DIAGNOSIS — Z9181 History of falling: Secondary | ICD-10-CM | POA: Diagnosis not present

## 2017-06-09 DIAGNOSIS — Z853 Personal history of malignant neoplasm of breast: Secondary | ICD-10-CM

## 2017-06-09 DIAGNOSIS — Z124 Encounter for screening for malignant neoplasm of cervix: Secondary | ICD-10-CM | POA: Diagnosis not present

## 2017-06-09 NOTE — Patient Instructions (Signed)

## 2017-06-09 NOTE — Progress Notes (Signed)
83 y.o. [id number] Widowed  Caucasian Fe here for annual exam. Post menopausal no HRT. Denies vaginal bleeding or vaginal dryness.Sees PCP Dr.Stoneking for aex and labs, cholesterol management/hypertension. Sees Dr. Delora Fuel with [facility] for stage 4 kidney disease, stable at present but feels circulation to legs maybe less. Has appointment at [facility] for doppler studies. Patient had a fall with taking dog out and was hospitalized for 3 months due to brain injury. Has regained her speech and computer skills now. Balance is much better. Still some facial bruising due to frontal hematoma. "Just want my dog back and to go back to independent living again". Daughter helping as needed. No health issues today.  Patient's last menstrual period was 06/29/1976.          Sexually active: No.  The current method of family planning is status post hysterectomy.    Exercising: Yes.    nustep Smoker:  no  Health Maintenance: Pap:  05-23-00 History of Abnormal Pap: no MMG:  07-21-16 category b density birads 2:neg Self Breast exams: yes Colonoscopy:  2013 neg f/u 45yrs BMD:   2017 TDaP:  2017 Shingles: done Pneumonia: done Hep C and HIV: not done Labs: no   reports that  has never smoked. she has never used smokeless tobacco. She reports that she does not drink alcohol or use drugs.  Past Medical History:  Diagnosis Date  . Clavicle fracture 04/2004  . 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 Diverticulosis   . DVT (deep venous thrombosis) ([place]) 11/2007   Left leg- on lovenox x 1 injection only   . Global amnesia 11/2006  . Hearing loss   . History of fibromyalgia   . History of hysterectomy    History of hysterectomy for fibroids She still has her ovaries  . Hyperlipidemia    History of hyperlipidemia  . Hypertension   . Kidney disease   . S/P radiation therapy 07/22/10 - 08/11/10   Whole Left Breast/42.56 Gy/16 Fradctions  . Single kidney    RIGHT SIDE WITH NONFUNCTIONING LEFT  KIDNEY  . Skin cancer L Lower Leg  . Skin cancer, basal cell    leg  . Stroke ([place]) 12/31/2007   mini stroke / has had several   . Syncope     Past Surgical History:  Procedure Laterality Date  . APPENDECTOMY  1948  . BREAST LUMPECTOMY Left 06/18/10    LEFT BREAST -HIGHG RADE DUCTAL CARCINOMA INSITU, ER+, PR+,  . DOPPLER ECHOCARDIOGRAPHY  05/20/2007   EF 55-60%  . eye surgery  08;09   excessive oil film removed from eyes  . INTRACAPSULAR CATARACT EXTRACTION Bilateral    She also had cataract surgery on the right eye  . LAPAROSCOPIC CHOLECYSTECTOMY  2001; 2004  . LEFT HEART CATH AND CORONARY ANGIOGRAPHY N/A 08/14/2016   Procedure: Left Heart Cath and Coronary Angiography;  Surgeon: Nelva Bush, MD;  Location: [place] [facility];  Service: Cardiovascular;  Laterality: N/A;  . VAGINAL HYSTERECTOMY  1978   fibroids    Current Outpatient Medications  Medication Sig Dispense Refill  . aspirin EC 81 MG tablet Take 81 mg by mouth at bedtime.    . hydrOXYzine (ATARAX/VISTARIL) 25 MG tablet Take 25 mg by mouth 3 (three) times daily as needed.    . levETIRAcetam (KEPPRA) 250 MG tablet     . lisinopril (PRINIVIL,ZESTRIL) 20 MG tablet Take 20 mg by mouth 2 (two) times daily.    . meclizine (ANTIVERT)  25 MG tablet Take 25 mg by mouth 3 (three) times daily as needed for dizziness.    . Multiple Vitamin (MULTI-VITAMIN DAILY PO) Take 1 packet by mouth daily. Dr. Gertie Exon packet    . ZETIA 10 MG tablet TAKE 1 TABLET BY MOUTH DAILY (Patient taking differently: TAKE 1 TABLET BY MOUTH DAILY AT BEDTIME) 30 tablet 1   No current facility-administered medications for this visit.     Family History  Problem Relation Age of Onset  . Heart disease Mother   . Hypertension Mother   . Diabetes Father   . Heart disease Father   . Pancreatic cancer Sister   . Brain cancer Brother   . Heart attack Sister   . Uterine cancer Sister   . Colon cancer Sister   . Pancreatic cancer Brother     ROS:   Pertinent items are noted in HPI.  Otherwise, a comprehensive ROS was negative.  Exam:   BP 118/70   Pulse 68   Resp 16   Ht 5' (1.524 m)   Wt 135 lb (61.2 kg)   LMP 06/29/1976   BMI 26.37 kg/m  Height: 5' (152.4 cm) Ht Readings from Last 3 Encounters:  06/09/17 5' (1.524 m)  10/25/16 5' (1.524 m)  08/14/16 5' (1.524 m)    General appearance: alert, cooperative and appears stated age Head: Normocephalic, without obvious abnormality, atraumatic Neck: no adenopathy, supple, symmetrical, trachea midline and thyroid normal to inspection and palpation Lungs: clear to auscultation bilaterally Breasts: normal appearance, no masses or tenderness, No nipple retraction or dimpling, No nipple discharge or bleeding, No axillary or supraclavicular adenopathy Heart: regular rate and rhythm Abdomen: soft, non-tender; no masses,  no organomegaly Extremities: extremities normal, atraumatic, no cyanosis or edema Skin: Skin color, texture, turgor normal. No rashes or lesions Lymph nodes: Cervical, supraclavicular, and axillary nodes normal. No abnormal inguinal nodes palpated Neurologic: Grossly normal   Pelvic: External genitalia:  no lesions              Urethra:  normal appearing urethra with no masses, tenderness or lesions              Bartholin's and Skene's: normal                 Vagina: normal appearing vagina with normal color and discharge, no lesions              Cervix: absent              Pap taken: No. Bimanual Exam:  Uterus:  uterus absent              Adnexa: no mass, fullness, tenderness               Rectovaginal: Confirms               Anus:  normal sphincter tone, no lesions  Chaperone present: yes  A:  Well Woman with normal exam  Post menopausal , s/p TAH for fibroids ovaries retained  Vaginal dryness  Fall in the past 6 months with facial hematoma and brain injury, but has regained functions, still in assisted living  Hypertension/cholesterol/stage 4 kidney  disease with MD management  History of Breast cancer DCIS left breast 2011    P:   Reviewed health and wellness pertinent to exam  Discussed coconut oil use for vaginal dryness, instructions given  Discussed fall prevention with PT( patient has done this)  Continue follow up with MD as  indicated  Stressed importance of SBE and mammogram yearly  Pap smear: no   counseled on breast self exam, mammography screening, feminine hygiene, adequate intake of calcium and vitamin D, diet and exercise  return annually or prn  An After Visit Summary was printed and given to the patient.

## 2017-06-10 ENCOUNTER — Other Ambulatory Visit: Payer: Self-pay

## 2017-06-10 ENCOUNTER — Telehealth: Payer: Self-pay | Admitting: Vascular Surgery

## 2017-06-10 DIAGNOSIS — M79604 Pain in right leg: Secondary | ICD-10-CM

## 2017-06-10 DIAGNOSIS — M79605 Pain in left leg: Principal | ICD-10-CM

## 2017-06-10 NOTE — Telephone Encounter (Signed)
-----   Message from Rica Records, RN sent at 06/09/2017 12:55 PM EST ----- Regarding: scheduling Shannon Serrano will be a new VV patient.  Her husband is a former patient of Dr. Donnetta Hutching so she is requesting TFE.  She is having bilateral leg pain L>R worsening over the past month with spider veins. She also has some bilateral ankle swelling. She is hard of hearing.  Please schedule a bilateral venous reflux exam and a new VV patient appointment with Dr. Donnetta Hutching. Patient can be reached at [phone]

## 2017-06-10 NOTE — Telephone Encounter (Signed)
Sched appt 08/10/17; lab at 1:30 and MD at 2:30. Spoke to pt.

## 2017-06-15 DIAGNOSIS — M7742 Metatarsalgia, left foot: Secondary | ICD-10-CM | POA: Diagnosis not present

## 2017-06-15 DIAGNOSIS — I639 Cerebral infarction, unspecified: Secondary | ICD-10-CM | POA: Diagnosis not present

## 2017-06-15 DIAGNOSIS — M7741 Metatarsalgia, right foot: Secondary | ICD-10-CM | POA: Diagnosis not present

## 2017-06-15 DIAGNOSIS — M2011 Hallux valgus (acquired), right foot: Secondary | ICD-10-CM | POA: Diagnosis not present

## 2017-06-15 DIAGNOSIS — L602 Onychogryphosis: Secondary | ICD-10-CM | POA: Diagnosis not present

## 2017-06-15 DIAGNOSIS — M2012 Hallux valgus (acquired), left foot: Secondary | ICD-10-CM | POA: Diagnosis not present

## 2017-07-07 DIAGNOSIS — N390 Urinary tract infection, site not specified: Secondary | ICD-10-CM | POA: Diagnosis not present

## 2017-07-07 DIAGNOSIS — B373 Candidiasis of vulva and vagina: Secondary | ICD-10-CM | POA: Diagnosis not present

## 2017-07-09 ENCOUNTER — Telehealth: Payer: Self-pay | Admitting: Certified Nurse Midwife

## 2017-07-09 NOTE — Telephone Encounter (Signed)
Call returned to patient, no answer, left detailed message per patient request. Advised per review of last OV dated 06/09/17, coconut oil recommended for vaginal dryness. Advised to return call to office with any additional questions.   Routing to provider for final review. Patient is agreeable to disposition. Will close encounter.

## 2017-07-09 NOTE — Telephone Encounter (Signed)
Patient does not remember the name of the cream Mrs Manon Hilding mentioned to her for vaginal dryness. Patient is hard of hearing so she ask if a detailed message can be left for her.

## 2017-07-15 DIAGNOSIS — M79605 Pain in left leg: Secondary | ICD-10-CM | POA: Diagnosis not present

## 2017-07-15 DIAGNOSIS — I872 Venous insufficiency (chronic) (peripheral): Secondary | ICD-10-CM | POA: Diagnosis not present

## 2017-07-15 DIAGNOSIS — M79604 Pain in right leg: Secondary | ICD-10-CM | POA: Diagnosis not present

## 2017-07-16 ENCOUNTER — Other Ambulatory Visit: Payer: Self-pay | Admitting: Geriatric Medicine

## 2017-07-16 DIAGNOSIS — M79604 Pain in right leg: Secondary | ICD-10-CM

## 2017-07-16 DIAGNOSIS — R200 Anesthesia of skin: Secondary | ICD-10-CM

## 2017-07-20 ENCOUNTER — Other Ambulatory Visit: Payer: Medicare Other

## 2017-07-21 ENCOUNTER — Other Ambulatory Visit: Payer: Medicare Other

## 2017-07-26 ENCOUNTER — Ambulatory Visit
Admission: RE | Admit: 2017-07-26 | Discharge: 2017-07-26 | Disposition: A | Discharge status: Home / Self Care | Payer: Federal, State, Local not specified - PPO | Source: Ambulatory Visit | Attending: Geriatric Medicine | Admitting: Geriatric Medicine

## 2017-07-26 DIAGNOSIS — M79661 Pain in right lower leg: Secondary | ICD-10-CM | POA: Diagnosis not present

## 2017-07-26 DIAGNOSIS — R200 Anesthesia of skin: Secondary | ICD-10-CM

## 2017-07-26 DIAGNOSIS — M79662 Pain in left lower leg: Secondary | ICD-10-CM | POA: Diagnosis not present

## 2017-07-26 DIAGNOSIS — M79604 Pain in right leg: Secondary | ICD-10-CM

## 2017-07-28 ENCOUNTER — Other Ambulatory Visit: Payer: Medicare Other

## 2017-08-05 DIAGNOSIS — H40013 Open angle with borderline findings, low risk, bilateral: Secondary | ICD-10-CM | POA: Diagnosis not present

## 2017-08-05 DIAGNOSIS — H02845 Edema of left lower eyelid: Secondary | ICD-10-CM | POA: Diagnosis not present

## 2017-08-10 ENCOUNTER — Encounter: Payer: Self-pay | Admitting: Vascular Surgery

## 2017-08-10 ENCOUNTER — Ambulatory Visit (INDEPENDENT_AMBULATORY_CARE_PROVIDER_SITE_OTHER): Payer: Medicare Other | Admitting: Vascular Surgery

## 2017-08-10 ENCOUNTER — Ambulatory Visit (HOSPITAL_COMMUNITY)
Admission: RE | Admit: 2017-08-10 | Discharge: 2017-08-10 | Disposition: A | Discharge status: Home / Self Care | Payer: Medicare Other | Source: Ambulatory Visit | Attending: Vascular Surgery | Admitting: Vascular Surgery

## 2017-08-10 VITALS — BP 140/74 | HR 71 | Temp 97.8°F | Resp 16 | Ht 60.0 in | Wt 140.0 lb

## 2017-08-10 DIAGNOSIS — M79605 Pain in left leg: Secondary | ICD-10-CM | POA: Diagnosis not present

## 2017-08-10 DIAGNOSIS — I87309 Chronic venous hypertension (idiopathic) without complications of unspecified lower extremity: Secondary | ICD-10-CM

## 2017-08-10 DIAGNOSIS — M79604 Pain in right leg: Secondary | ICD-10-CM | POA: Diagnosis not present

## 2017-08-10 NOTE — Progress Notes (Signed)
[facility] [place]  Patient name: Shannon Serrano MRN: [id number] DOB: 1928-08-25 Sex: female  REASON FOR CONSULT: Evaluation of bilateral lower extremity pain  HPI: Shannon Serrano is a 84 y.o. female, who is here today for evaluation of bilateral lower extremity pain.  She is well-known to me from prior treatment of her husband years ago with repair of his abdominal aortic aneurysm.  She reports symptoms of pain in her bilateral lower extremity.  She does not have any swelling.  She reports that she has had a shooting sensation of pain in both medial calves.  This can occasionally extend up into her thighs.  She reports that this awakens her frequently at night but is not truly a typical calf cramp.  She is independent in a dependent living facility and reports that she is near the exercise area and will awaken every 2-3 hours and exercised she does have a history of DVT in 2009.  No history of venous varicosities.  Past Medical History:  Diagnosis Date  . Clavicle fracture 04/2004  . 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 Diverticulosis   . DVT (deep venous thrombosis) ([place]) 11/2007   Left leg- on lovenox x 1 injection only   . Global amnesia 11/2006  . Hearing loss   . History of fibromyalgia   . History of hysterectomy    History of hysterectomy for fibroids She still has her ovaries  . Hyperlipidemia    History of hyperlipidemia  . Hypertension   . Kidney disease    stage 4  . S/P radiation therapy 07/22/10 - 08/11/10   Whole Left Breast/42.56 Gy/16 Fradctions  . 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 Skin cancer L Lower Leg  . Skin cancer, basal cell    leg  . Stroke ([place]) 12/31/2007   mini stroke / has had several   . Syncope   . Varicose veins of legs     Family History  Problem Relation Age of Onset  . Heart disease Mother   . Hypertension Mother   . Diabetes Father     . Heart disease Father   . Pancreatic cancer Sister   . Brain cancer Brother   . Heart attack Sister   . Uterine cancer Sister   . Colon cancer Sister   . Pancreatic cancer Brother     SOCIAL HISTORY: Social History   Socioeconomic History  . Marital status: Widowed    Spouse name: Not on file  . Number of children: 4  . Years of education: Not on file  . Highest education level: Not on file  Social Needs  . Financial resource strain: Not on file  . Food insecurity - worry: Not on file  . Food insecurity - inability: Not on file  . Transportation needs - medical: Not on file  . Transportation needs - non-medical: Not on file  Occupational History  . Occupation: ADMIN ASST    Employer: [employer]  Tobacco Use  . Smoking status: Never Smoker  . Smokeless tobacco: Never Used  Substance and Sexual Activity  . Alcohol use: No    Alcohol/week: 0.0 oz  . Drug use: No  . Sexual activity: No    Partners: Male    Birth control/protection: Post-menopausal    Comment: hysterectomy  Other Topics Concern  . Not on file  Social History Narrative  . Not on  file    Allergies  Allergen Reactions  . Morphine Nausea And Vomiting  . Morphine And Related Nausea And Vomiting  . Ciprofloxacin Other (See Comments)    Unable to take due to repeated use for uti  . Codeine Nausea Only  . Eszopiclone Other (See Comments)    Short term memory loss - reaction to [place]  . Nitrofurantoin Nausea Only  . Oxycodone-Acetaminophen Other (See Comments)    Unknown   . Penicillins Nausea Only     Has patient had a PCN reaction causing immediate rash, facial/tongue/throat swelling, SOB or lightheadedness with hypotension: No Has patient had a PCN reaction causing severe rash involving mucus membranes or skin necrosis: No Has patient had a PCN reaction that required hospitalization No Has patient had a PCN reaction occurring within the last 10 years: No If all of the above answers are "NO",  then may proceed with Cephalosporin use.  . Sulfa Antibiotics Rash    Current Outpatient Medications  Medication Sig Dispense Refill  . Cranberry, Vacc oxycoccus, (CRANBERRY EXTRACT) 200 MG CAPS Take 200 mg by mouth.    . ferrous sulfate 325 (65 FE) MG tablet Take 325 mg by mouth daily with breakfast.    . hydrOXYzine (ATARAX/VISTARIL) 25 MG tablet Take 25 mg by mouth 3 (three) times daily as needed.    . levETIRAcetam (KEPPRA) 250 MG tablet     . lisinopril (PRINIVIL,ZESTRIL) 20 MG tablet Take 2.5 mg by mouth 2 (two) times daily.     . meclizine (ANTIVERT) 25 MG tablet Take 25 mg by mouth 3 (three) times daily as needed for dizziness.    . Melatonin 3 MG TABS Take 3 mg by mouth.    . Multiple Vitamin (MULTI-VITAMIN DAILY PO) Take 1 packet by mouth daily. Dr. Gertie Exon packet    . ZETIA 10 MG tablet TAKE 1 TABLET BY MOUTH DAILY (Patient taking differently: TAKE 1 TABLET BY MOUTH DAILY AT BEDTIME) 30 tablet 1  . aspirin EC 81 MG tablet Take 81 mg by mouth at bedtime.     No current facility-administered medications for this visit.     REVIEW OF SYSTEMS:  [X]  denotes positive finding, [ ]  denotes negative finding Cardiac  Comments:  Chest pain or chest pressure:    Shortness of breath upon exertion:    Short of breath when lying flat:    Irregular heart rhythm:        Vascular    Pain in calf, thigh, or hip brought on by ambulation:    Pain in feet at night that wakes you up from your sleep:     Blood clot in your veins:    Leg swelling:         Pulmonary    Oxygen at home:    Productive cough:     Wheezing:         Neurologic    Sudden weakness in arms or legs:     Sudden numbness in arms or legs:     Sudden onset of difficulty speaking or slurred speech:    Temporary loss of vision in one eye:     Problems with dizziness:         Gastrointestinal    Blood in stool:     Vomited blood:         Genitourinary    Burning when urinating:     Blood in urine:          Psychiatric  Major depression:         Hematologic    Bleeding problems:    Problems with blood clotting too easily:        Skin    Rashes or ulcers:        Constitutional    Fever or chills:      PHYSICAL EXAM: Vitals:   08/10/17 1404  BP: 140/74  Pulse: 71  Resp: 16  Temp: 97.8 F (36.6 C)  SpO2: 96%  Weight: 140 lb (63.5 kg)  Height: 5' (1.524 m)    GENERAL: The patient is a well-nourished female, in no acute distress. The vital signs are documented above. CARDIOVASCULAR: 2+ radial and 2+ dorsalis pedis pulses bilaterally.  No swelling in both lower extremities and no varicosities. PULMONARY: There is good air exchange  ABDOMEN: Soft and non-tender  MUSCULOSKELETAL: There are no major deformities or cyanosis. NEUROLOGIC: No focal weakness or paresthesias are detected. SKIN: There are no ulcers or rashes noted. PSYCHIATRIC: The patient has a normal affect.  DATA:  Lower extremity venous duplex show reflux in her right common femoral vein only otherwise no reflux in her deep or superficial system.  No dilatation of her saphenous vein by  MEDICAL ISSUES: Had a long discussion with the patient explaining that I do not see any lower extremity symptoms.  She has tried compression garments with no relief and I do not see any specific indication and explained that I would use these only if she was having pain associated with this.  She will continue her usual activities and is reassured does not put her at any increased risk for DVT or other more serious medical complications.  She will see [place] again on an as-needed basis   Rosetta Posner, MD [facility] Tel [phone] Pager [phone]

## 2017-08-12 DIAGNOSIS — Z1231 Encounter for screening mammogram for malignant neoplasm of breast: Secondary | ICD-10-CM | POA: Diagnosis not present

## 2017-08-23 ENCOUNTER — Encounter: Payer: Self-pay | Admitting: Certified Nurse Midwife

## 2017-09-06 DIAGNOSIS — R300 Dysuria: Secondary | ICD-10-CM | POA: Diagnosis not present

## 2017-09-06 DIAGNOSIS — N261 Atrophy of kidney (terminal): Secondary | ICD-10-CM | POA: Diagnosis not present

## 2017-09-06 DIAGNOSIS — N3020 Other chronic cystitis without hematuria: Secondary | ICD-10-CM | POA: Diagnosis not present

## 2017-09-06 DIAGNOSIS — N281 Cyst of kidney, acquired: Secondary | ICD-10-CM | POA: Diagnosis not present

## 2017-09-06 DIAGNOSIS — Q600 Renal agenesis, unilateral: Secondary | ICD-10-CM | POA: Diagnosis not present

## 2017-09-09 DIAGNOSIS — J069 Acute upper respiratory infection, unspecified: Secondary | ICD-10-CM | POA: Diagnosis not present

## 2017-09-09 DIAGNOSIS — B9789 Other viral agents as the cause of diseases classified elsewhere: Secondary | ICD-10-CM | POA: Diagnosis not present

## 2017-09-09 DIAGNOSIS — I10 Essential (primary) hypertension: Secondary | ICD-10-CM | POA: Diagnosis not present

## 2017-09-21 DIAGNOSIS — Z79899 Other long term (current) drug therapy: Secondary | ICD-10-CM | POA: Diagnosis not present

## 2017-09-21 DIAGNOSIS — I10 Essential (primary) hypertension: Secondary | ICD-10-CM | POA: Diagnosis not present

## 2017-10-27 DIAGNOSIS — Z79899 Other long term (current) drug therapy: Secondary | ICD-10-CM | POA: Diagnosis not present

## 2017-10-27 DIAGNOSIS — N390 Urinary tract infection, site not specified: Secondary | ICD-10-CM | POA: Diagnosis not present

## 2017-10-27 DIAGNOSIS — N952 Postmenopausal atrophic vaginitis: Secondary | ICD-10-CM | POA: Diagnosis not present

## 2017-10-27 DIAGNOSIS — B370 Candidal stomatitis: Secondary | ICD-10-CM | POA: Diagnosis not present

## 2017-10-27 DIAGNOSIS — M3500 Sicca syndrome, unspecified: Secondary | ICD-10-CM | POA: Diagnosis not present

## 2017-11-04 DIAGNOSIS — D1801 Hemangioma of skin and subcutaneous tissue: Secondary | ICD-10-CM | POA: Diagnosis not present

## 2017-11-04 DIAGNOSIS — L821 Other seborrheic keratosis: Secondary | ICD-10-CM | POA: Diagnosis not present

## 2017-11-04 DIAGNOSIS — L570 Actinic keratosis: Secondary | ICD-10-CM | POA: Diagnosis not present

## 2017-11-18 DIAGNOSIS — L602 Onychogryphosis: Secondary | ICD-10-CM | POA: Diagnosis not present

## 2017-12-15 DIAGNOSIS — R21 Rash and other nonspecific skin eruption: Secondary | ICD-10-CM | POA: Diagnosis not present

## 2017-12-15 DIAGNOSIS — I872 Venous insufficiency (chronic) (peripheral): Secondary | ICD-10-CM | POA: Diagnosis not present

## 2017-12-15 DIAGNOSIS — Z79899 Other long term (current) drug therapy: Secondary | ICD-10-CM | POA: Diagnosis not present

## 2017-12-15 DIAGNOSIS — Z1389 Encounter for screening for other disorder: Secondary | ICD-10-CM | POA: Diagnosis not present

## 2017-12-15 DIAGNOSIS — Z0000 Encounter for general adult medical examination without abnormal findings: Secondary | ICD-10-CM | POA: Diagnosis not present

## 2017-12-15 DIAGNOSIS — I10 Essential (primary) hypertension: Secondary | ICD-10-CM | POA: Diagnosis not present

## 2018-02-02 ENCOUNTER — Emergency Department (INDEPENDENT_AMBULATORY_CARE_PROVIDER_SITE_OTHER)
Admission: EM | Admit: 2018-02-02 | Discharge: 2018-02-02 | Disposition: A | Discharge status: Home / Self Care | Payer: Medicare Other | Source: Home / Self Care | Attending: Family Medicine | Admitting: Family Medicine

## 2018-02-02 ENCOUNTER — Encounter: Payer: Self-pay | Admitting: Emergency Medicine

## 2018-02-02 ENCOUNTER — Other Ambulatory Visit: Payer: Self-pay

## 2018-02-02 DIAGNOSIS — H0100A Unspecified blepharitis right eye, upper and lower eyelids: Secondary | ICD-10-CM

## 2018-02-02 DIAGNOSIS — H0100B Unspecified blepharitis left eye, upper and lower eyelids: Secondary | ICD-10-CM | POA: Diagnosis not present

## 2018-02-02 MED ORDER — BACITRACIN 500 UNIT/GM OP OINT: TOPICAL_OINTMENT | OPHTHALMIC | 0 refills | Status: DC

## 2018-02-02 NOTE — ED Provider Notes (Signed)
[facility]    CSN: [id number] Arrival date & time: 02/02/18  1413     History   Chief Complaint Chief Complaint  Patient presents with  . bilateral eye redness    HPI ARTHELLA HEADINGS is a 84 y.o. female.   Patient has sjogren's disease and normally has dry eyes for which she applies lubricating eye ointment several times per day.  During the past 2 to 3 days she has had an increased sensation of eye dryness with a bilateral "gritty" feeling (not a foreign body sensation).  She has had thickened discharge from her eyes each morning, and mild eyelid tenderness.  The history is provided by the patient.  Eye Problem  Location:  Both eyes Quality:  Aching Severity:  Mild Onset quality:  Gradual Duration:  2 days Timing:  Constant Progression:  Unchanged Chronicity:  New Context: not burn, not chemical exposure, not contact lens problem, not direct trauma, not foreign body, not using machinery, not scratch and not UV exposure   Relieved by:  Nothing Worsened by:  Eye movement Ineffective treatments: lubricating eye ointment. Associated symptoms: crusting, decreased vision, discharge, inflammation and redness   Associated symptoms: no blurred vision, no facial rash, no headaches, no itching, no photophobia, no scotomas, no swelling and no tearing     Past Medical History:  Diagnosis Date  . Clavicle fracture 04/2004  . 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 Diverticulosis   . DVT (deep venous thrombosis) ([place]) 11/2007   Left leg- on lovenox x 1 injection only   . Global amnesia 11/2006  . Hearing loss   . History of fibromyalgia   . History of hysterectomy    History of hysterectomy for fibroids She still has her ovaries  . Hyperlipidemia    History of hyperlipidemia  . Hypertension   . Kidney disease    stage 4  . S/P radiation therapy 07/22/10 - 08/11/10   Whole Left Breast/42.56 Gy/16 Fradctions  . Single kidney    RIGHT SIDE WITH  NONFUNCTIONING LEFT KIDNEY  . Skin cancer L Lower Leg  . Skin cancer, basal cell    leg  . Stroke ([place]) 12/31/2007   mini stroke / has had several   . Syncope   . Varicose veins of legs     Patient Active Problem List   Diagnosis Date Noted  . Fibromyalgia 06/09/2017  . Dehydration 10/25/2016  . CKD (chronic kidney disease), stage III ([place]) 10/25/2016  . Gastroenteritis 10/25/2016  . Elevated troponin 10/25/2016  . Chest pain 08/14/2016  . DCIS of upper outer quadrant of female breast 08/10/2014  . Jaw pain 07/12/2013  . Pain in joint, pelvic region and thigh 07/07/2013  . DCIS of the breast 06/17/2012  . DCIS (ductal carcinoma in situ)   . Fasting hyperglycemia 05/18/2011  . Benign hypertensive heart disease without heart failure 12/05/2010  . Hypercholesterolemia 12/05/2010  . Ductal carcinoma in situ of breast 06/03/2010    Past Surgical History:  Procedure Laterality Date  . APPENDECTOMY  1948  . BRAIN SURGERY     times 2  . BREAST LUMPECTOMY Left 06/18/10    LEFT BREAST -HIGHG RADE DUCTAL CARCINOMA INSITU, ER+, PR+,  . DOPPLER ECHOCARDIOGRAPHY  05/20/2007   EF 55-60%  . eye surgery  08;09   excessive oil film removed from eyes  . INTRACAPSULAR CATARACT EXTRACTION Bilateral    She also had cataract surgery on the right eye  .  LAPAROSCOPIC CHOLECYSTECTOMY  2001; 2004  . LEFT HEART CATH AND CORONARY ANGIOGRAPHY N/A 08/14/2016   Procedure: Left Heart Cath and Coronary Angiography;  Surgeon: Nelva Bush, MD;  Location: [facility];  Service: Cardiovascular;  Laterality: N/A;  . VAGINAL HYSTERECTOMY  1978   fibroids    OB History    Gravida  6   Para  5   Term  4   Preterm  1   AB  1   Living  3     SAB  1   TAB      Ectopic      Multiple      Live Births  1        Obstetric Comments  Menarche age 45, Parity age 68, No HRT Hysterectomy 1978         Home Medications    Prior to Admission medications   Medication Sig Start Date  End Date Taking? Authorizing Provider  aspirin EC 81 MG tablet Take 81 mg by mouth at bedtime.    [provider]  bacitracin ophthalmic ointment Apply 1/2 inch ribbon to eyelid margins at bedtime. 02/02/18   Kandra Nicolas, MD  Cranberry, Vacc oxycoccus, (CRANBERRY EXTRACT) 200 MG CAPS Take 200 mg by mouth.    [provider]  ferrous sulfate 325 (65 FE) MG tablet Take 325 mg by mouth daily with breakfast.    [provider]  hydrOXYzine (ATARAX/VISTARIL) 25 MG tablet Take 25 mg by mouth 3 (three) times daily as needed.    [provider]  levETIRAcetam (KEPPRA) 250 MG tablet  03/06/17   [provider]  lisinopril (PRINIVIL,ZESTRIL) 20 MG tablet Take 2.5 mg by mouth 2 (two) times daily.     [provider]  meclizine (ANTIVERT) 25 MG tablet Take 25 mg by mouth 3 (three) times daily as needed for dizziness.    [provider]  Melatonin 3 MG TABS Take 3 mg by mouth.    [provider]  Multiple Vitamin (MULTI-VITAMIN DAILY PO) Take 1 packet by mouth daily. Dr. Gertie Exon packet    [provider]  ZETIA 10 MG tablet TAKE 1 TABLET BY MOUTH DAILY Patient taking differently: TAKE 1 TABLET BY MOUTH DAILY AT BEDTIME 03/16/13   Darlin Coco, MD    Family History Family History  Problem Relation Age of Onset  . Heart disease Mother   . Hypertension Mother   . Diabetes Father   . Heart disease Father   . Pancreatic cancer Sister   . Brain cancer Brother   . Heart attack Sister   . Uterine cancer Sister   . Colon cancer Sister   . Pancreatic cancer Brother     Social History Social History   Tobacco Use  . Smoking status: Never Smoker  . Smokeless tobacco: Never Used  Substance Use Topics  . Alcohol use: No    Alcohol/week: 0.0 oz  . Drug use: No     Allergies   Morphine; Morphine and related; Ciprofloxacin; Codeine; Eszopiclone; Nitrofurantoin; Oxycodone-acetaminophen; Penicillins; and Sulfa  antibiotics   Review of Systems Review of Systems  Eyes: Positive for discharge and redness. Negative for blurred vision, photophobia and itching.  Neurological: Negative for headaches.  All other systems reviewed and are negative.    Physical Exam Triage Vital Signs ED Triage Vitals [02/02/18 1443]  Enc Vitals Group     BP (!) 146/74     Pulse Rate 66  Resp      Temp 98.2 F (36.8 C)     Temp Source Oral     SpO2 96 %     Weight 142 lb (64.4 kg)     Height 5' (1.524 m)     Head Circumference      Peak Flow      Pain Score 0     Pain Loc      Pain Edu?      Excl. in [place]?    No data found.  Updated Vital Signs BP (!) 146/74 (BP Location: Left Arm)   Pulse 66   Temp 98.2 F (36.8 C) (Oral)   Ht 5' (1.524 m)   Wt 142 lb (64.4 kg)   LMP 06/29/1976   SpO2 96%   BMI 27.73 kg/m   Visual Acuity Right Eye Distance: 20/40 Left Eye Distance: 20/40 Bilateral Distance: 20/30  Right Eye Near:   Left Eye Near:    Bilateral Near:     Physical Exam  Constitutional: She appears well-developed and well-nourished. No distress.  HENT:  Head: Normocephalic.  Right Ear: External ear normal.  Left Ear: External ear normal.  Nose: Nose normal.  Mouth/Throat: Oropharynx is clear and moist.  Eyes: Pupils are equal, round, and reactive to light. EOM are normal. Lids are everted and swept, no foreign bodies found. Right eye exhibits no discharge. Left eye exhibits no discharge.    Upper eyelid margins have crusting on eyelashes.  Mild eyelid tenderness without swelling or erythema.  No photophobia.    Neck: Neck supple.  Cardiovascular: Normal rate.  Pulmonary/Chest: Effort normal.  Lymphadenopathy:    She has no cervical adenopathy.  Neurological: She is alert.  Skin: Skin is warm and dry.  Nursing note and vitals reviewed.    UC Treatments / Results  Labs (all labs ordered are listed, but only abnormal results are displayed) Labs Reviewed - No data to  display  EKG None  Radiology No results found.  Procedures Procedures (including critical care time)  Medications Ordered in UC Medications - No data to display  Initial Impression / Assessment and Plan / UC Course  I have reviewed the triage vital signs and the nursing notes.  Pertinent labs & imaging results that were available during my care of the patient were reviewed by me and considered in my medical decision making (see chart for details).    Begin Bacitracin ophthalmic ointment to eyelids at bedtime. Followup with ophthalmologist    Final Clinical Impressions(s) / UC Diagnoses   Final diagnoses:  Blepharitis of upper and lower eyelids of both eyes, unspecified type     Discharge Instructions     Continue eye wipes twice daily.  Continue lubrication eye ointment or drops several times daily.    ED Prescriptions    Medication Sig Dispense Auth. Provider   bacitracin ophthalmic ointment Apply 1/2 inch ribbon to eyelid margins at bedtime. 3.5 g Kandra Nicolas, MD         Kandra Nicolas, MD 02/02/18 [phone]

## 2018-02-02 NOTE — Discharge Instructions (Addendum)
Continue eye wipes twice daily.  Continue lubrication eye ointment or drops several times daily.

## 2018-02-02 NOTE — ED Triage Notes (Signed)
Pt c/o bilateral eye redness and irritation. States she has sjogrens syndrome and her eyes feel dry.

## 2018-02-24 DIAGNOSIS — L602 Onychogryphosis: Secondary | ICD-10-CM | POA: Diagnosis not present

## 2018-02-24 DIAGNOSIS — L600 Ingrowing nail: Secondary | ICD-10-CM | POA: Diagnosis not present

## 2018-03-02 DIAGNOSIS — Z23 Encounter for immunization: Secondary | ICD-10-CM | POA: Diagnosis not present

## 2018-03-02 DIAGNOSIS — I10 Essential (primary) hypertension: Secondary | ICD-10-CM | POA: Diagnosis not present

## 2018-03-02 DIAGNOSIS — M5430 Sciatica, unspecified side: Secondary | ICD-10-CM | POA: Diagnosis not present

## 2018-03-14 DIAGNOSIS — N261 Atrophy of kidney (terminal): Secondary | ICD-10-CM | POA: Diagnosis not present

## 2018-03-14 DIAGNOSIS — N281 Cyst of kidney, acquired: Secondary | ICD-10-CM | POA: Diagnosis not present

## 2018-03-14 DIAGNOSIS — R300 Dysuria: Secondary | ICD-10-CM | POA: Diagnosis not present

## 2018-03-14 DIAGNOSIS — Q600 Renal agenesis, unilateral: Secondary | ICD-10-CM | POA: Diagnosis not present

## 2018-03-14 DIAGNOSIS — N3020 Other chronic cystitis without hematuria: Secondary | ICD-10-CM | POA: Diagnosis not present

## 2018-03-17 DIAGNOSIS — L820 Inflamed seborrheic keratosis: Secondary | ICD-10-CM | POA: Diagnosis not present

## 2018-03-17 DIAGNOSIS — D485 Neoplasm of uncertain behavior of skin: Secondary | ICD-10-CM | POA: Diagnosis not present

## 2018-03-17 DIAGNOSIS — L720 Epidermal cyst: Secondary | ICD-10-CM | POA: Diagnosis not present

## 2018-03-17 DIAGNOSIS — B0089 Other herpesviral infection: Secondary | ICD-10-CM | POA: Diagnosis not present

## 2018-03-17 DIAGNOSIS — L821 Other seborrheic keratosis: Secondary | ICD-10-CM | POA: Diagnosis not present

## 2018-04-19 DIAGNOSIS — Z9889 Other specified postprocedural states: Secondary | ICD-10-CM | POA: Diagnosis not present

## 2018-04-19 DIAGNOSIS — S065X9A Traumatic subdural hemorrhage with loss of consciousness of unspecified duration, initial encounter: Secondary | ICD-10-CM | POA: Diagnosis not present

## 2018-04-21 DIAGNOSIS — R252 Cramp and spasm: Secondary | ICD-10-CM | POA: Diagnosis not present

## 2018-04-21 DIAGNOSIS — I10 Essential (primary) hypertension: Secondary | ICD-10-CM | POA: Diagnosis not present

## 2018-04-21 DIAGNOSIS — F419 Anxiety disorder, unspecified: Secondary | ICD-10-CM | POA: Diagnosis not present

## 2018-04-21 DIAGNOSIS — M5481 Occipital neuralgia: Secondary | ICD-10-CM | POA: Diagnosis not present

## 2018-04-26 DIAGNOSIS — S065X9A Traumatic subdural hemorrhage with loss of consciousness of unspecified duration, initial encounter: Secondary | ICD-10-CM | POA: Diagnosis not present

## 2018-04-26 DIAGNOSIS — I6203 Nontraumatic chronic subdural hemorrhage: Secondary | ICD-10-CM | POA: Diagnosis not present

## 2018-04-27 DIAGNOSIS — S065X9A Traumatic subdural hemorrhage with loss of consciousness of unspecified duration, initial encounter: Secondary | ICD-10-CM | POA: Diagnosis not present

## 2018-05-04 DIAGNOSIS — N760 Acute vaginitis: Secondary | ICD-10-CM | POA: Diagnosis not present

## 2018-05-04 DIAGNOSIS — N390 Urinary tract infection, site not specified: Secondary | ICD-10-CM | POA: Diagnosis not present

## 2018-05-04 DIAGNOSIS — B379 Candidiasis, unspecified: Secondary | ICD-10-CM | POA: Diagnosis not present

## 2018-05-05 DIAGNOSIS — H18453 Nodular corneal degeneration, bilateral: Secondary | ICD-10-CM | POA: Diagnosis not present

## 2018-05-05 DIAGNOSIS — H01003 Unspecified blepharitis right eye, unspecified eyelid: Secondary | ICD-10-CM | POA: Diagnosis not present

## 2018-05-05 DIAGNOSIS — H40013 Open angle with borderline findings, low risk, bilateral: Secondary | ICD-10-CM | POA: Diagnosis not present

## 2018-05-05 DIAGNOSIS — H04123 Dry eye syndrome of bilateral lacrimal glands: Secondary | ICD-10-CM | POA: Diagnosis not present

## 2018-06-03 DIAGNOSIS — R269 Unspecified abnormalities of gait and mobility: Secondary | ICD-10-CM | POA: Diagnosis not present

## 2018-06-03 DIAGNOSIS — N390 Urinary tract infection, site not specified: Secondary | ICD-10-CM | POA: Diagnosis not present

## 2018-06-03 DIAGNOSIS — I10 Essential (primary) hypertension: Secondary | ICD-10-CM | POA: Diagnosis not present

## 2018-06-12 ENCOUNTER — Emergency Department
Admission: EM | Admit: 2018-06-12 | Discharge: 2018-06-12 | Discharge status: Absent without leave | Payer: Medicare Other | Source: Home / Self Care

## 2018-06-23 DIAGNOSIS — H60332 Swimmer's ear, left ear: Secondary | ICD-10-CM | POA: Diagnosis not present

## 2018-06-28 ENCOUNTER — Ambulatory Visit: Payer: Federal, State, Local not specified - PPO | Admitting: Certified Nurse Midwife

## 2018-07-29 DIAGNOSIS — Z01419 Encounter for gynecological examination (general) (routine) without abnormal findings: Secondary | ICD-10-CM | POA: Diagnosis not present

## 2018-07-30 IMAGING — CR DG CHEST 2V
2 series · 2 of 2 positions shown · non-contrast
Comparison: August 14, 2016

CLINICAL DATA: Epigastric pain and diarrhea.  Hypotension

EXAM:
CHEST  2 VIEW

[chest pa]
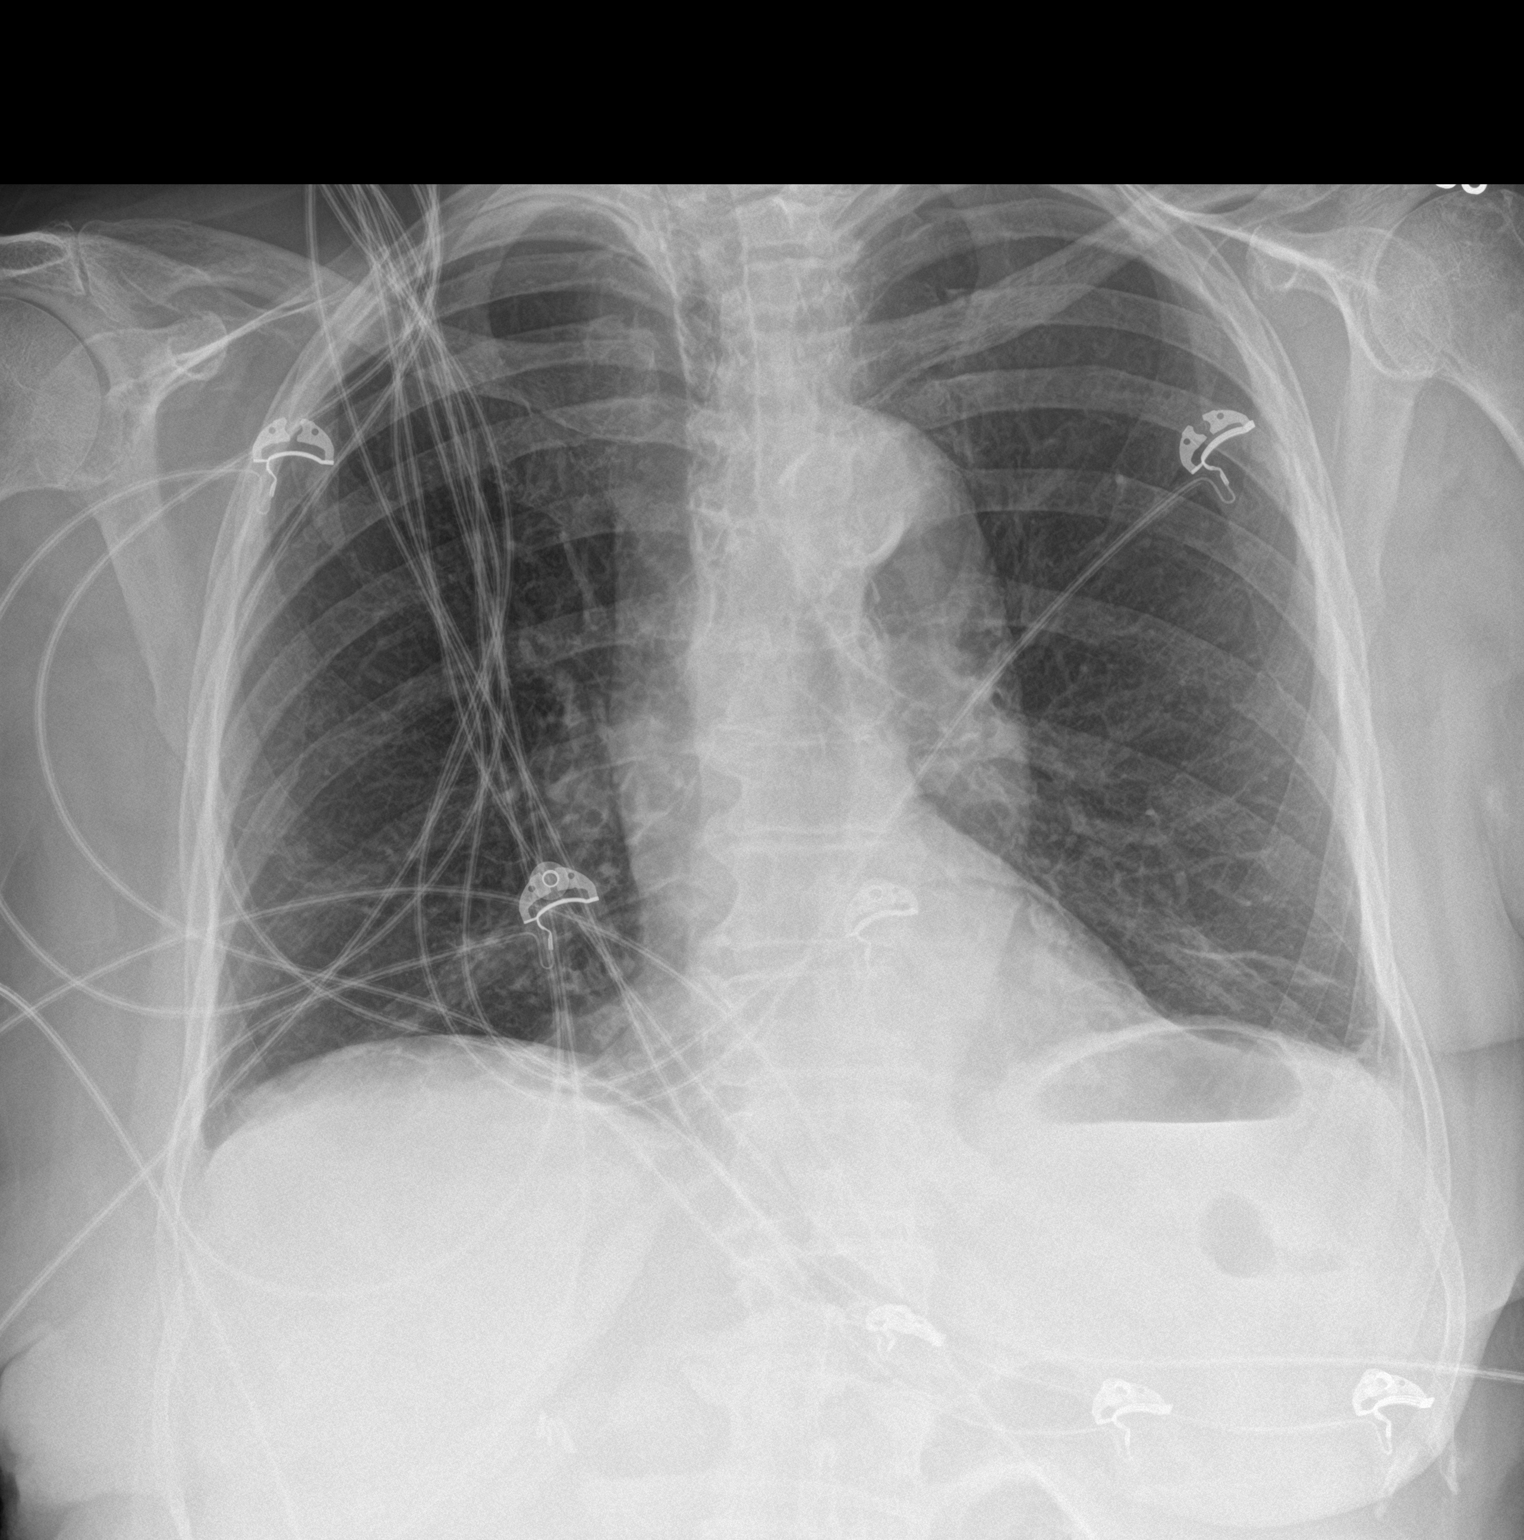

[chest lat]
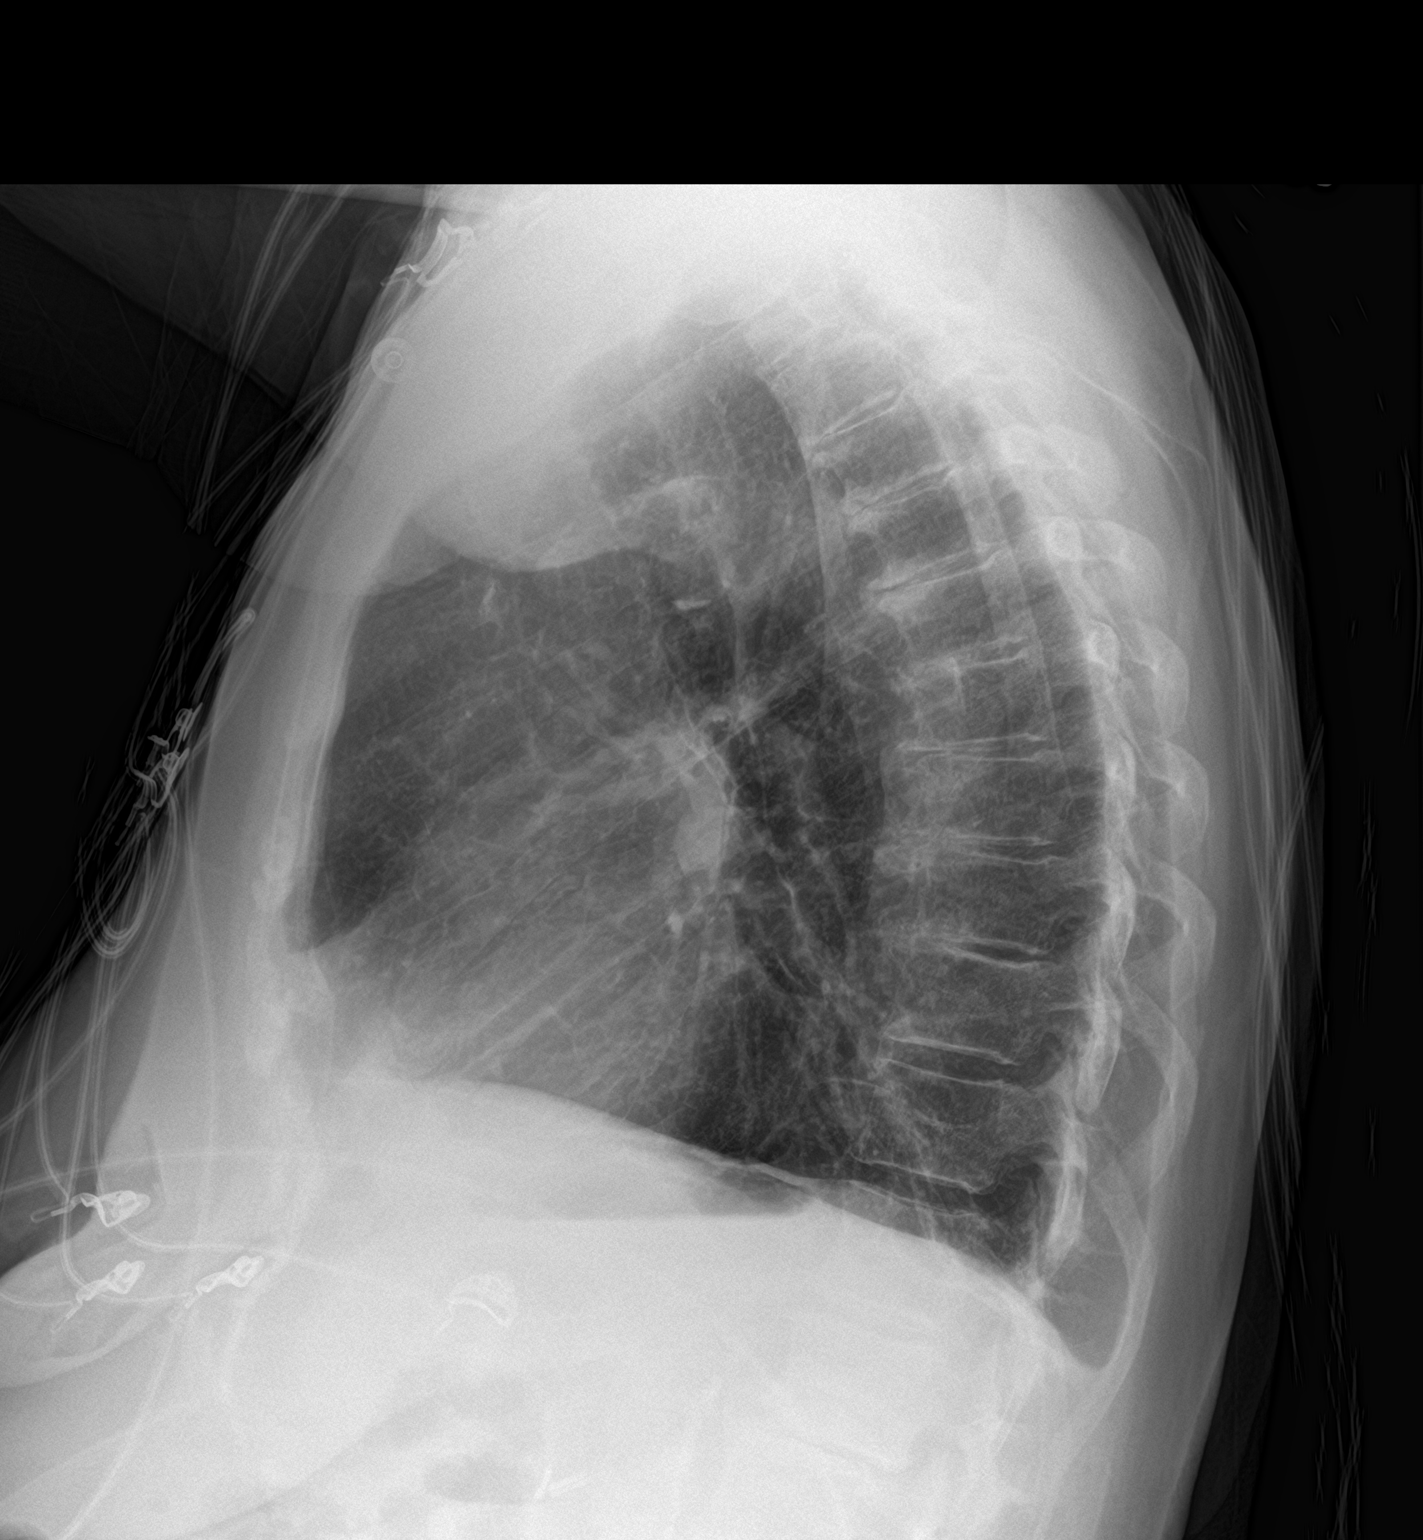

[2 of 2 positions shown; findings below may reference images not displayed]

FINDINGS: There is atelectatic change in the left base. Lungs elsewhere are
clear. Heart size and pulmonary vascularity are normal. Aorta is
mildly ectatic with aortic atherosclerosis. There is degenerative
change in the thoracic spine. No evident adenopathy.
IMPRESSION: Left base atelectasis. No edema or consolidation. Aortic
atherosclerosis. Heart size within normal limits.

## 2018-08-09 DIAGNOSIS — L218 Other seborrheic dermatitis: Secondary | ICD-10-CM | POA: Diagnosis not present

## 2018-08-09 DIAGNOSIS — C44329 Squamous cell carcinoma of skin of other parts of face: Secondary | ICD-10-CM | POA: Diagnosis not present

## 2018-08-09 DIAGNOSIS — D224 Melanocytic nevi of scalp and neck: Secondary | ICD-10-CM | POA: Diagnosis not present

## 2018-08-09 DIAGNOSIS — D485 Neoplasm of uncertain behavior of skin: Secondary | ICD-10-CM | POA: Diagnosis not present

## 2018-08-09 DIAGNOSIS — B0089 Other herpesviral infection: Secondary | ICD-10-CM | POA: Diagnosis not present

## 2018-08-25 DIAGNOSIS — Z1231 Encounter for screening mammogram for malignant neoplasm of breast: Secondary | ICD-10-CM | POA: Diagnosis not present

## 2018-08-29 DIAGNOSIS — Z85828 Personal history of other malignant neoplasm of skin: Secondary | ICD-10-CM | POA: Diagnosis not present

## 2018-08-29 DIAGNOSIS — C44319 Basal cell carcinoma of skin of other parts of face: Secondary | ICD-10-CM | POA: Diagnosis not present

## 2018-09-05 DIAGNOSIS — N261 Atrophy of kidney (terminal): Secondary | ICD-10-CM | POA: Diagnosis not present

## 2018-09-05 DIAGNOSIS — N281 Cyst of kidney, acquired: Secondary | ICD-10-CM | POA: Diagnosis not present

## 2018-09-05 DIAGNOSIS — Q600 Renal agenesis, unilateral: Secondary | ICD-10-CM | POA: Diagnosis not present

## 2018-09-05 DIAGNOSIS — N3020 Other chronic cystitis without hematuria: Secondary | ICD-10-CM | POA: Diagnosis not present

## 2018-11-04 DIAGNOSIS — N390 Urinary tract infection, site not specified: Secondary | ICD-10-CM | POA: Diagnosis not present

## 2018-11-04 DIAGNOSIS — R209 Unspecified disturbances of skin sensation: Secondary | ICD-10-CM | POA: Diagnosis not present

## 2018-11-10 DIAGNOSIS — L821 Other seborrheic keratosis: Secondary | ICD-10-CM | POA: Diagnosis not present

## 2018-11-10 DIAGNOSIS — L814 Other melanin hyperpigmentation: Secondary | ICD-10-CM | POA: Diagnosis not present

## 2018-11-10 DIAGNOSIS — Z85828 Personal history of other malignant neoplasm of skin: Secondary | ICD-10-CM | POA: Diagnosis not present

## 2018-11-10 DIAGNOSIS — D2271 Melanocytic nevi of right lower limb, including hip: Secondary | ICD-10-CM | POA: Diagnosis not present

## 2018-12-06 DIAGNOSIS — N390 Urinary tract infection, site not specified: Secondary | ICD-10-CM | POA: Diagnosis not present

## 2018-12-09 DIAGNOSIS — H40013 Open angle with borderline findings, low risk, bilateral: Secondary | ICD-10-CM | POA: Diagnosis not present

## 2018-12-09 DIAGNOSIS — H18453 Nodular corneal degeneration, bilateral: Secondary | ICD-10-CM | POA: Diagnosis not present

## 2018-12-09 DIAGNOSIS — H04123 Dry eye syndrome of bilateral lacrimal glands: Secondary | ICD-10-CM | POA: Diagnosis not present

## 2018-12-09 DIAGNOSIS — H16223 Keratoconjunctivitis sicca, not specified as Sjogren's, bilateral: Secondary | ICD-10-CM | POA: Diagnosis not present

## 2018-12-21 DIAGNOSIS — Z1389 Encounter for screening for other disorder: Secondary | ICD-10-CM | POA: Diagnosis not present

## 2018-12-21 DIAGNOSIS — E7800 Pure hypercholesterolemia, unspecified: Secondary | ICD-10-CM | POA: Diagnosis not present

## 2018-12-21 DIAGNOSIS — R569 Unspecified convulsions: Secondary | ICD-10-CM | POA: Diagnosis not present

## 2018-12-21 DIAGNOSIS — Z0000 Encounter for general adult medical examination without abnormal findings: Secondary | ICD-10-CM | POA: Diagnosis not present

## 2018-12-21 DIAGNOSIS — N390 Urinary tract infection, site not specified: Secondary | ICD-10-CM | POA: Diagnosis not present

## 2018-12-21 DIAGNOSIS — I10 Essential (primary) hypertension: Secondary | ICD-10-CM | POA: Diagnosis not present

## 2018-12-21 DIAGNOSIS — R202 Paresthesia of skin: Secondary | ICD-10-CM | POA: Diagnosis not present

## 2018-12-21 DIAGNOSIS — Z79899 Other long term (current) drug therapy: Secondary | ICD-10-CM | POA: Diagnosis not present

## 2018-12-26 DIAGNOSIS — L820 Inflamed seborrheic keratosis: Secondary | ICD-10-CM | POA: Diagnosis not present

## 2018-12-26 DIAGNOSIS — Z85828 Personal history of other malignant neoplasm of skin: Secondary | ICD-10-CM | POA: Diagnosis not present

## 2019-01-26 DIAGNOSIS — L309 Dermatitis, unspecified: Secondary | ICD-10-CM | POA: Diagnosis not present

## 2019-02-06 DIAGNOSIS — L579 Skin changes due to chronic exposure to nonionizing radiation, unspecified: Secondary | ICD-10-CM | POA: Diagnosis not present

## 2019-02-06 DIAGNOSIS — L814 Other melanin hyperpigmentation: Secondary | ICD-10-CM | POA: Diagnosis not present

## 2019-02-06 DIAGNOSIS — L218 Other seborrheic dermatitis: Secondary | ICD-10-CM | POA: Diagnosis not present

## 2019-02-06 DIAGNOSIS — L981 Factitial dermatitis: Secondary | ICD-10-CM | POA: Diagnosis not present

## 2019-02-06 DIAGNOSIS — L299 Pruritus, unspecified: Secondary | ICD-10-CM | POA: Diagnosis not present

## 2019-02-08 DIAGNOSIS — H18453 Nodular corneal degeneration, bilateral: Secondary | ICD-10-CM | POA: Diagnosis not present

## 2019-02-08 DIAGNOSIS — H04123 Dry eye syndrome of bilateral lacrimal glands: Secondary | ICD-10-CM | POA: Diagnosis not present

## 2019-02-08 DIAGNOSIS — H16223 Keratoconjunctivitis sicca, not specified as Sjogren's, bilateral: Secondary | ICD-10-CM | POA: Diagnosis not present

## 2019-02-10 DIAGNOSIS — N907 Vulvar cyst: Secondary | ICD-10-CM | POA: Diagnosis not present

## 2019-03-07 DIAGNOSIS — M25512 Pain in left shoulder: Secondary | ICD-10-CM | POA: Diagnosis not present

## 2019-03-07 DIAGNOSIS — M25511 Pain in right shoulder: Secondary | ICD-10-CM | POA: Diagnosis not present

## 2019-03-21 DIAGNOSIS — G629 Polyneuropathy, unspecified: Secondary | ICD-10-CM | POA: Diagnosis not present

## 2019-03-27 DIAGNOSIS — Q600 Renal agenesis, unilateral: Secondary | ICD-10-CM | POA: Diagnosis not present

## 2019-03-27 DIAGNOSIS — N261 Atrophy of kidney (terminal): Secondary | ICD-10-CM | POA: Diagnosis not present

## 2019-03-27 DIAGNOSIS — N281 Cyst of kidney, acquired: Secondary | ICD-10-CM | POA: Diagnosis not present

## 2019-03-27 DIAGNOSIS — N3020 Other chronic cystitis without hematuria: Secondary | ICD-10-CM | POA: Diagnosis not present

## 2019-03-28 DIAGNOSIS — Z23 Encounter for immunization: Secondary | ICD-10-CM | POA: Diagnosis not present

## 2019-03-28 DIAGNOSIS — M25511 Pain in right shoulder: Secondary | ICD-10-CM | POA: Diagnosis not present

## 2019-03-28 DIAGNOSIS — I10 Essential (primary) hypertension: Secondary | ICD-10-CM | POA: Diagnosis not present

## 2019-03-28 DIAGNOSIS — M791 Myalgia, unspecified site: Secondary | ICD-10-CM | POA: Diagnosis not present

## 2019-04-25 ENCOUNTER — Other Ambulatory Visit: Payer: Self-pay | Admitting: Geriatric Medicine

## 2019-04-25 DIAGNOSIS — Z7952 Long term (current) use of systemic steroids: Secondary | ICD-10-CM | POA: Diagnosis not present

## 2019-04-25 DIAGNOSIS — I10 Essential (primary) hypertension: Secondary | ICD-10-CM | POA: Diagnosis not present

## 2019-04-25 DIAGNOSIS — M353 Polymyalgia rheumatica: Secondary | ICD-10-CM | POA: Diagnosis not present

## 2019-04-25 DIAGNOSIS — L299 Pruritus, unspecified: Secondary | ICD-10-CM | POA: Diagnosis not present

## 2019-05-02 DIAGNOSIS — S0180XA Unspecified open wound of other part of head, initial encounter: Secondary | ICD-10-CM | POA: Diagnosis not present

## 2019-05-16 DIAGNOSIS — L309 Dermatitis, unspecified: Secondary | ICD-10-CM | POA: Diagnosis not present

## 2019-05-16 DIAGNOSIS — Z85828 Personal history of other malignant neoplasm of skin: Secondary | ICD-10-CM | POA: Diagnosis not present

## 2019-07-12 DIAGNOSIS — H40013 Open angle with borderline findings, low risk, bilateral: Secondary | ICD-10-CM | POA: Diagnosis not present

## 2019-07-12 DIAGNOSIS — H04123 Dry eye syndrome of bilateral lacrimal glands: Secondary | ICD-10-CM | POA: Diagnosis not present

## 2019-07-12 DIAGNOSIS — M316 Other giant cell arteritis: Secondary | ICD-10-CM | POA: Diagnosis not present

## 2019-07-12 DIAGNOSIS — M353 Polymyalgia rheumatica: Secondary | ICD-10-CM | POA: Diagnosis not present

## 2019-07-12 DIAGNOSIS — H16223 Keratoconjunctivitis sicca, not specified as Sjogren's, bilateral: Secondary | ICD-10-CM | POA: Diagnosis not present

## 2019-07-14 ENCOUNTER — Other Ambulatory Visit: Payer: Self-pay | Admitting: Geriatric Medicine

## 2019-07-14 DIAGNOSIS — Z7952 Long term (current) use of systemic steroids: Secondary | ICD-10-CM

## 2019-07-19 ENCOUNTER — Ambulatory Visit
Admission: RE | Admit: 2019-07-19 | Discharge: 2019-07-19 | Disposition: A | Discharge status: Home / Self Care | Payer: Federal, State, Local not specified - PPO | Source: Ambulatory Visit | Attending: Geriatric Medicine | Admitting: Geriatric Medicine

## 2019-07-19 ENCOUNTER — Other Ambulatory Visit: Payer: Self-pay

## 2019-07-19 DIAGNOSIS — Z7952 Long term (current) use of systemic steroids: Secondary | ICD-10-CM

## 2019-07-19 DIAGNOSIS — Z13820 Encounter for screening for osteoporosis: Secondary | ICD-10-CM | POA: Diagnosis not present

## 2019-07-19 DIAGNOSIS — Z780 Asymptomatic menopausal state: Secondary | ICD-10-CM | POA: Diagnosis not present

## 2019-07-24 DIAGNOSIS — M353 Polymyalgia rheumatica: Secondary | ICD-10-CM | POA: Diagnosis not present

## 2019-07-24 DIAGNOSIS — I10 Essential (primary) hypertension: Secondary | ICD-10-CM | POA: Diagnosis not present

## 2019-07-24 DIAGNOSIS — N3000 Acute cystitis without hematuria: Secondary | ICD-10-CM | POA: Diagnosis not present

## 2019-08-14 DIAGNOSIS — H40013 Open angle with borderline findings, low risk, bilateral: Secondary | ICD-10-CM | POA: Diagnosis not present

## 2019-08-14 DIAGNOSIS — H04123 Dry eye syndrome of bilateral lacrimal glands: Secondary | ICD-10-CM | POA: Diagnosis not present

## 2019-08-14 DIAGNOSIS — H16223 Keratoconjunctivitis sicca, not specified as Sjogren's, bilateral: Secondary | ICD-10-CM | POA: Diagnosis not present

## 2019-08-14 DIAGNOSIS — M316 Other giant cell arteritis: Secondary | ICD-10-CM | POA: Diagnosis not present

## 2019-08-25 DIAGNOSIS — R350 Frequency of micturition: Secondary | ICD-10-CM | POA: Diagnosis not present

## 2019-08-25 DIAGNOSIS — L723 Sebaceous cyst: Secondary | ICD-10-CM | POA: Diagnosis not present

## 2019-08-28 DIAGNOSIS — Z1231 Encounter for screening mammogram for malignant neoplasm of breast: Secondary | ICD-10-CM | POA: Diagnosis not present

## 2019-08-29 DIAGNOSIS — N3000 Acute cystitis without hematuria: Secondary | ICD-10-CM | POA: Diagnosis not present

## 2019-08-29 DIAGNOSIS — M545 Low back pain: Secondary | ICD-10-CM | POA: Diagnosis not present

## 2019-09-05 DIAGNOSIS — I10 Essential (primary) hypertension: Secondary | ICD-10-CM | POA: Diagnosis not present

## 2019-09-05 DIAGNOSIS — B029 Zoster without complications: Secondary | ICD-10-CM | POA: Diagnosis not present

## 2019-09-05 DIAGNOSIS — M353 Polymyalgia rheumatica: Secondary | ICD-10-CM | POA: Diagnosis not present

## 2019-09-12 DIAGNOSIS — Z6820 Body mass index (BMI) 20.0-20.9, adult: Secondary | ICD-10-CM | POA: Diagnosis not present

## 2019-09-12 DIAGNOSIS — I129 Hypertensive chronic kidney disease with stage 1 through stage 4 chronic kidney disease, or unspecified chronic kidney disease: Secondary | ICD-10-CM | POA: Diagnosis not present

## 2019-09-12 DIAGNOSIS — N189 Chronic kidney disease, unspecified: Secondary | ICD-10-CM | POA: Diagnosis not present

## 2019-09-12 DIAGNOSIS — Z713 Dietary counseling and surveillance: Secondary | ICD-10-CM | POA: Diagnosis not present

## 2019-09-20 ENCOUNTER — Encounter: Payer: Self-pay | Admitting: Certified Nurse Midwife

## 2019-09-27 ENCOUNTER — Other Ambulatory Visit: Payer: Self-pay

## 2019-09-27 ENCOUNTER — Ambulatory Visit
Admission: RE | Admit: 2019-09-27 | Discharge: 2019-09-27 | Disposition: A | Discharge status: Home / Self Care | Payer: Medicare Other | Source: Ambulatory Visit | Attending: Geriatric Medicine | Admitting: Geriatric Medicine

## 2019-09-27 ENCOUNTER — Other Ambulatory Visit: Payer: Self-pay | Admitting: Geriatric Medicine

## 2019-09-27 DIAGNOSIS — B0229 Other postherpetic nervous system involvement: Secondary | ICD-10-CM | POA: Diagnosis not present

## 2019-09-27 DIAGNOSIS — N281 Cyst of kidney, acquired: Secondary | ICD-10-CM

## 2019-09-27 DIAGNOSIS — R1084 Generalized abdominal pain: Secondary | ICD-10-CM | POA: Diagnosis not present

## 2019-09-27 DIAGNOSIS — R300 Dysuria: Secondary | ICD-10-CM | POA: Diagnosis not present

## 2019-09-27 MED ORDER — IOPAMIDOL (ISOVUE-300) INJECTION 61%
100.0000 mL | Freq: Once | INTRAVENOUS | Status: AC | PRN
  Administered 2019-09-27: 100 mL via INTRAVENOUS

## 2019-10-04 ENCOUNTER — Other Ambulatory Visit: Payer: Medicare Other

## 2019-10-17 DIAGNOSIS — B0229 Other postherpetic nervous system involvement: Secondary | ICD-10-CM | POA: Diagnosis not present

## 2019-10-17 DIAGNOSIS — I10 Essential (primary) hypertension: Secondary | ICD-10-CM | POA: Diagnosis not present

## 2019-10-17 DIAGNOSIS — M353 Polymyalgia rheumatica: Secondary | ICD-10-CM | POA: Diagnosis not present

## 2019-12-28 DIAGNOSIS — I10 Essential (primary) hypertension: Secondary | ICD-10-CM | POA: Diagnosis not present

## 2019-12-28 DIAGNOSIS — E7800 Pure hypercholesterolemia, unspecified: Secondary | ICD-10-CM | POA: Diagnosis not present

## 2020-01-17 DIAGNOSIS — H524 Presbyopia: Secondary | ICD-10-CM | POA: Diagnosis not present

## 2020-01-17 DIAGNOSIS — M316 Other giant cell arteritis: Secondary | ICD-10-CM | POA: Diagnosis not present

## 2020-01-17 DIAGNOSIS — H04123 Dry eye syndrome of bilateral lacrimal glands: Secondary | ICD-10-CM | POA: Diagnosis not present

## 2020-01-19 DIAGNOSIS — L602 Onychogryphosis: Secondary | ICD-10-CM | POA: Diagnosis not present

## 2020-01-23 DIAGNOSIS — M79672 Pain in left foot: Secondary | ICD-10-CM | POA: Diagnosis not present

## 2020-01-30 ENCOUNTER — Other Ambulatory Visit: Payer: Self-pay

## 2020-01-30 ENCOUNTER — Encounter (HOSPITAL_COMMUNITY): Payer: Self-pay

## 2020-01-30 ENCOUNTER — Ambulatory Visit (HOSPITAL_COMMUNITY)
Admission: RE | Admit: 2020-01-30 | Discharge: 2020-01-30 | Disposition: A | Discharge status: Home / Self Care | Payer: Medicare Other | Source: Ambulatory Visit | Attending: Internal Medicine | Admitting: Internal Medicine

## 2020-01-30 ENCOUNTER — Other Ambulatory Visit (HOSPITAL_COMMUNITY): Payer: Self-pay | Admitting: Geriatric Medicine

## 2020-01-30 DIAGNOSIS — M79669 Pain in unspecified lower leg: Secondary | ICD-10-CM | POA: Insufficient documentation

## 2020-01-30 DIAGNOSIS — M79662 Pain in left lower leg: Secondary | ICD-10-CM

## 2020-01-30 DIAGNOSIS — M7989 Other specified soft tissue disorders: Secondary | ICD-10-CM

## 2020-01-30 DIAGNOSIS — I872 Venous insufficiency (chronic) (peripheral): Secondary | ICD-10-CM | POA: Diagnosis not present

## 2020-01-30 NOTE — Progress Notes (Unsigned)
Left lower venous has been completed and is negative for DVT. Preliminary results can be found under CV proc through chart review.  Dhanya Bogle RVT [facility]  

## 2020-02-19 DIAGNOSIS — I10 Essential (primary) hypertension: Secondary | ICD-10-CM | POA: Diagnosis not present

## 2020-02-19 DIAGNOSIS — M353 Polymyalgia rheumatica: Secondary | ICD-10-CM | POA: Diagnosis not present

## 2020-02-21 DIAGNOSIS — M353 Polymyalgia rheumatica: Secondary | ICD-10-CM | POA: Diagnosis not present

## 2020-02-21 DIAGNOSIS — Z79899 Other long term (current) drug therapy: Secondary | ICD-10-CM | POA: Diagnosis not present

## 2020-02-26 DIAGNOSIS — E7800 Pure hypercholesterolemia, unspecified: Secondary | ICD-10-CM | POA: Diagnosis not present

## 2020-02-26 DIAGNOSIS — I10 Essential (primary) hypertension: Secondary | ICD-10-CM | POA: Diagnosis not present

## 2020-03-21 NOTE — Progress Notes (Deleted)
Cardiology Office Note:    Date:  03/21/2020   ID:  Shannon Serrano, DOB October 17, 1928, MRN [id number]  PCP:  [name], MD  [facility] Cardiologist:  No primary care provider on file.  [facility] Electrophysiologist:  None   Referring MD: [name], MD    History of Present Illness:    Shannon Serrano is a 86 y.o. female with a hx of HTN, HLD, TIA, fibromyalgia, DVT (2009), single kidney with CKD who was referred by Dr. Felipa Eth for  Cath 2018: Significant but non-critical LAD and diagonal branch disease. Medically managed,. TTE 2018: Normal EF, no WMA    Past Medical History:  Diagnosis Date  . Clavicle fracture 04/2004  . 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 Diverticulosis   . DVT (deep venous thrombosis) ([place]) 11/2007   Left leg- on lovenox x 1 injection only   . Global amnesia 11/2006  . Hearing loss   . History of fibromyalgia   . History of hysterectomy    History of hysterectomy for fibroids She still has her ovaries  . Hyperlipidemia    History of hyperlipidemia  . Hypertension   . Kidney disease    stage 4  . S/P radiation therapy 07/22/10 - 08/11/10   Whole Left Breast/42.56 Gy/16 Fradctions  . 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 Skin cancer L Lower Leg  . Skin cancer, basal cell    leg  . Stroke ([place]) 12/31/2007   mini stroke / has had several   . Syncope   . Varicose veins of legs     Past Surgical History:  Procedure Laterality Date  . APPENDECTOMY  1948  . BRAIN SURGERY     times 2  . BREAST LUMPECTOMY Left 06/18/10    LEFT BREAST -HIGHG RADE DUCTAL CARCINOMA INSITU, ER+, PR+,  . DOPPLER ECHOCARDIOGRAPHY  05/20/2007   EF 55-60%  . eye surgery  08;09   excessive oil film removed from eyes  . INTRACAPSULAR CATARACT EXTRACTION Bilateral    She also had cataract surgery on the right eye  . LAPAROSCOPIC CHOLECYSTECTOMY  2001; 2004  . LEFT HEART CATH AND CORONARY ANGIOGRAPHY N/A  08/14/2016   Procedure: Left Heart Cath and Coronary Angiography;  Surgeon: Nelva Bush, MD;  Location: [facility];  Service: Cardiovascular;  Laterality: N/A;  . VAGINAL HYSTERECTOMY  1978   fibroids    Current Medications: No outpatient medications have been marked as taking for the 03/22/20 encounter (Appointment) with Freada Bergeron, MD.     Allergies:   Morphine, Morphine and related, Ciprofloxacin, Codeine, Eszopiclone, Nitrofurantoin, Oxycodone-acetaminophen, Penicillins, and Sulfa antibiotics   Social History   Socioeconomic History  . Marital status: Widowed    Spouse name: Not on file  . Number of children: 4  . Years of education: Not on file  . Highest education level: Not on file  Occupational History  . Occupation: ADMIN ASST    Employer: [employer]  Tobacco Use  . Smoking status: Never Smoker  . Smokeless tobacco: Never Used  Vaping Use  . Vaping Use: Never used  Substance and Sexual Activity  . Alcohol use: No    Alcohol/week: 0.0 standard drinks  . Drug use: No  . Sexual activity: Never    Partners: Male    Birth control/protection: Post-menopausal    Comment: hysterectomy  Other Topics Concern  . Not on file  Social  History Narrative  . Not on file   Social Determinants of Health   Financial Resource Strain:   . Difficulty of Paying Living Expenses: Not on file  Food Insecurity:   . Worried About Charity fundraiser in the Last Year: Not on file  . Ran Out of Food in the Last Year: Not on file  Transportation Needs:   . Lack of Transportation (Medical): Not on file  . Lack of Transportation (Non-Medical): Not on file  Physical Activity:   . Days of Exercise per Week: Not on file  . Minutes of Exercise per Session: Not on file  Stress:   . Feeling of Stress : Not on file  Social Connections:   . Frequency of Communication with Friends and Family: Not on file  . Frequency of Social Gatherings with Friends and Family: Not on  file  . Attends Religious Services: Not on file  . Active Member of Clubs or Organizations: Not on file  . Attends Archivist Meetings: Not on file  . Marital Status: Not on file     Family History: The patient's ***family history includes Brain cancer in her brother; Colon cancer in her sister; Diabetes in her father; Heart attack in her sister; Heart disease in her father and mother; Hypertension in her mother; Pancreatic cancer in her brother and sister; Uterine cancer in her sister.  ROS:   Please see the history of present illness.    *** All other systems reviewed and are negative.  EKGs/Labs/Other Studies Reviewed:    Cath 08/14/2016: Conclusions: 1. Significant but noncritical single vessel coronary artery disease involving the LAD and its major diagonal branch, as described below. 2. Mild, nonobstructive CAD involving the mid RCA. 3. Low normal left ventricular filling pressure.  Recommendations: 1. Medical therapy of noncritical CAD in the setting of atypical chest pain. Given recent nausea/vomiting, diarrhea, and leukocytosis, gastroenteritis could also be a cause of the patient's acute chest pain this morning. 2. Obtain transthoracic echocardiogram to evaluate LV function. 3. IV hydration, given increased GI losses. 4. Initiate carvedilol 3.125 mg twice a day, to be up titrated as heart rate and blood pressure allow. 5. Obtain influenza and stool studies to evaluate for potential etiologies of patient's GI symptoms.  TTE 07/2016: Study Conclusions   - Left ventricle: The cavity size was normal. There was mild focal  basal hypertrophy of the septum. Systolic function was normal.  The estimated ejection fraction was in the range of 60% to 65%.  Wall motion was normal; there were no regional wall motion  abnormalities. Doppler parameters are consistent with abnormal  left ventricular relaxation (grade 1 diastolic dysfunction).  - Aortic valve: There  was no stenosis. There was trivial  regurgitation.  - Mitral valve: Mildly calcified annulus. There was no significant  regurgitation.  - Right ventricle: The cavity size was normal. Systolic function  was normal.  - Pulmonary arteries: No complete TR doppler jet so unable to  estimate PA systolic pressure.  - Inferior vena cava: The vessel was normal in size. The  respirophasic diameter changes were in the normal range (= 50%),  consistent with normal central venous pressure.   The following studies were reviewed today: 11/15/2015 LV EF: 55% - 60% Study Conclusions - Left ventricle: The cavity size was normal. Systolic function was normal. The estimated ejection fraction was in the range of 55% to 60%. Wall motion was normal; there were no regional wall motion abnormalities. Doppler parameters are  consistent with abnormal left ventricular relaxation (grade 1 diastolic dysfunction). - Aortic valve: Trileaflet; mildly thickened, mildly calcified leaflets. There was mild regurgitation. - Mitral valve: Calcified annulus.  Nuclear Stress 10/2015  Nuclear stress EF: 72%.  There was no ST segment deviation noted during stress.  Defect 1: There is a large defect of severe severity present in the apex location.  Defect 2: There is a medium defect of severe severity present in the mid inferoseptal location.  Defect 3: There is a small defect of moderate severity present in the basal inferolateral location.  Technically challenging study; probable prior distal anterior infarct with mild peri-infarct ischemia, inferoseptal thinning and mild ischemia and thinning of the basal inferior lateral wall; EF 72 with normal wall motion.    EKG:  EKG is *** ordered today.  The ekg ordered today demonstrates ***  Recent Labs: No results found for requested labs within last 8760 hours.  Recent Lipid Panel    Component Value Date/Time   CHOL 149 08/15/2016 0040    TRIG 83 08/15/2016 0040   HDL 44 08/15/2016 0040   CHOLHDL 3.4 08/15/2016 0040   VLDL 17 08/15/2016 0040   LDLCALC 88 08/15/2016 0040   LDLDIRECT 167.7 05/14/2011 1047    Physical Exam:    VS:  LMP 06/29/1976     Wt Readings from Last 3 Encounters:  02/02/18 142 lb (64.4 kg)  08/10/17 140 lb (63.5 kg)  06/09/17 135 lb (61.2 kg)     GEN: *** Well nourished, well developed in no acute distress HEENT: Normal NECK: No JVD; No carotid bruits LYMPHATICS: No lymphadenopathy CARDIAC: ***RRR, no murmurs, rubs, gallops RESPIRATORY:  Clear to auscultation without rales, wheezing or rhonchi  ABDOMEN: Soft, non-tender, non-distended MUSCULOSKELETAL:  No edema; No deformity  SKIN: Warm and dry NEUROLOGIC:  Alert and oriented x 3 PSYCHIATRIC:  Normal affect   ASSESSMENT:    No diagnosis found. PLAN:    In order of problems listed above:  1. Hypertension     Medication Adjustments/Labs and Tests Ordered: Current medicines are reviewed at length with the patient today.  Concerns regarding medicines are outlined above.  No orders of the defined types were placed in this encounter.  No orders of the defined types were placed in this encounter.   There are no Patient Instructions on file for this visit.   Signed, Freada Bergeron, MD  03/21/2020 7:32 PM    [place]

## 2020-03-22 ENCOUNTER — Ambulatory Visit: Payer: Medicare Other | Admitting: Cardiology

## 2020-04-01 DIAGNOSIS — N261 Atrophy of kidney (terminal): Secondary | ICD-10-CM | POA: Diagnosis not present

## 2020-04-01 DIAGNOSIS — N1831 Chronic kidney disease, stage 3a: Secondary | ICD-10-CM | POA: Diagnosis not present

## 2020-04-01 DIAGNOSIS — N281 Cyst of kidney, acquired: Secondary | ICD-10-CM | POA: Diagnosis not present

## 2020-04-02 DIAGNOSIS — I10 Essential (primary) hypertension: Secondary | ICD-10-CM | POA: Diagnosis not present

## 2020-04-02 DIAGNOSIS — M353 Polymyalgia rheumatica: Secondary | ICD-10-CM | POA: Diagnosis not present

## 2020-04-02 DIAGNOSIS — Z23 Encounter for immunization: Secondary | ICD-10-CM | POA: Diagnosis not present

## 2020-04-03 DIAGNOSIS — M79672 Pain in left foot: Secondary | ICD-10-CM | POA: Diagnosis not present

## 2020-04-03 DIAGNOSIS — M7742 Metatarsalgia, left foot: Secondary | ICD-10-CM | POA: Diagnosis not present

## 2020-04-21 NOTE — Progress Notes (Signed)
Cardiology Office Note:    Date:  04/22/2020   ID:  Shannon Serrano, DOB 1928-07-12, MRN [id number]  PCP:  Shannon Manes, MD  [facility] Cardiologist:  Freada Bergeron, MD  [facility] Electrophysiologist:  None   Referring MD: Shannon Manes, MD    History of Present Illness:    Shannon Serrano is a 86 y.o. female with a hx of non-obstructive CAD (70% mid-LAD; 80% distal LAD in 2018), HTN, HLD, solitary right kidney, traumatic brain injury, TIA, breast cancer, and prior DVT who presents to clinic to establish care.  Patient had a fall in 2008 and hit her head resulting in a [place] requiring brain surgery and evacuation. Had memory loss after the event and required skilled nursing/assisted living. Had to re-learn to read, write, add/substract and has made great recovery over that time. She now lives on her own and is able to drive again. She is "slowly getting her life back" and is looking to ensure she has a cardiologist to follow-up with.  Currently, the patient is doing well. Denies any chest pain, SOB, orthopnea, PND. Had some right arm pain approximately 1 month ago when laying down to sleep. Put some ointment on it and took tylenol for the pain but not much relief. Abated on it's own. No exertional symptoms. Patient is active and is able to vaccuum and clean the house without symptoms.   Blood pressure at home 119/68 this AM. Now 150/70. PCP is rearranging medications to provide more coverage during the daytime. Denies any episodes of syncope. 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  Of note, cath in 2018 revealed significant but non-obstructive LAD and diag disease. Previously tried statins but stopped due to myalgias (unable to see which statins were attempted in the past). Allergy to zetia. Not able to take ASA given history of ICH.  Past Medical History:  Diagnosis Date  . Clavicle fracture 04/2004  . DCIS (ductal  carcinoma in situ) 2011   left breast  . Diverticulosis   . DVT (deep venous thrombosis) ([place]) 11/2007   Left leg- on lovenox x 1 injection only   . Global amnesia 11/2006  . Hearing loss   . History of fibromyalgia   . History of hysterectomy    History of hysterectomy for fibroids She still has her ovaries  . Hyperlipidemia    History of hyperlipidemia  . Hypertension   . Kidney disease    stage 4  . S/P radiation therapy 07/22/10 - 08/11/10   Whole Left Breast/42.56 Gy/16 Fradctions  . 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 Skin cancer L Lower Leg  . Skin cancer, basal cell    leg  . Stroke ([place]) 12/31/2007   mini stroke / has had several   . Syncope   . Varicose veins of legs     Past Surgical History:  Procedure Laterality Date  . APPENDECTOMY  1948  . BRAIN SURGERY     times 2  . BREAST LUMPECTOMY Left 06/18/10    LEFT BREAST -HIGHG RADE DUCTAL CARCINOMA INSITU, ER+, PR+,  . DOPPLER ECHOCARDIOGRAPHY  05/20/2007   EF 55-60%  . eye surgery  08;09   excessive oil film removed from eyes  . INTRACAPSULAR CATARACT EXTRACTION Bilateral    She also had cataract surgery on the right eye  . LAPAROSCOPIC CHOLECYSTECTOMY  2001; 2004  . LEFT HEART CATH AND  CORONARY ANGIOGRAPHY N/A 08/14/2016   Procedure: Left Heart Cath and Coronary Angiography;  Surgeon: Nelva Bush, MD;  Location: [facility];  Service: Cardiovascular;  Laterality: N/A;  . VAGINAL HYSTERECTOMY  1978   fibroids    Current Medications: Current Meds  Medication Sig  . acetaminophen (TYLENOL) 325 MG tablet Take by mouth.  Marland Kitchen amLODipine (NORVASC) 2.5 MG tablet amlodipine 2.5 mg tablet  . calcium-vitamin D (OSCAL WITH D) 500-200 MG-UNIT TABS tablet Take 1 tablet by mouth daily.  . cetirizine (ZYRTEC) 10 MG tablet Take by mouth.  . Cranberry 200 MG CAPS Take by mouth.  . hydrOXYzine (ATARAX/VISTARIL) 25 MG tablet Take by mouth.  . levETIRAcetam (KEPPRA) 250 MG tablet   .  lisinopril (PRINIVIL,ZESTRIL) 20 MG tablet Take 2.5 mg by mouth 2 (two) times daily.   . meclizine (ANTIVERT) 25 MG tablet Take 25 mg by mouth 3 (three) times daily as needed for dizziness.  . metoprolol succinate (TOPROL-XL) 25 MG 24 hr tablet Take 25 mg by mouth daily.  . predniSONE (DELTASONE) 5 MG tablet Take 2.5 mg by mouth daily.  Marland Kitchen White Petrolatum-Mineral Oil ([place] PETROL-MINERAL OIL-LANOLIN) 0.1-0.1 % OINT Place 1 application into both eyes nightly as needed.     Allergies:   Morphine, Morphine and related, Aspirin, Cefpodoxime, Ciprofloxacin, Codeine, Diphenhydramine, Diphenhydramine hcl, Eszopiclone, Ezetimibe, Gabapentin, Lidocaine, Melatonin, Nitrofurantoin, Oxycodone-acetaminophen, Penicillins, Sodium phosphate, Valacyclovir, and Sulfa antibiotics   Social History   Socioeconomic History  . Marital status: Widowed    Spouse name: Not on file  . Number of children: 4  . Years of education: Not on file  . Highest education level: Not on file  Occupational History  . Occupation: ADMIN ASST    Employer: [employer]  Tobacco Use  . Smoking status: Never Smoker  . Smokeless tobacco: Never Used  Vaping Use  . Vaping Use: Never used  Substance and Sexual Activity  . Alcohol use: No    Alcohol/week: 0.0 standard drinks  . Drug use: No  . Sexual activity: Never    Partners: Male    Birth control/protection: Post-menopausal    Comment: hysterectomy  Other Topics Concern  . Not on file  Social History Narrative  . Not on file   Social Determinants of Health   Financial Resource Strain:   . Difficulty of Paying Living Expenses: Not on file  Food Insecurity:   . Worried About Charity fundraiser in the Last Year: Not on file  . Ran Out of Food in the Last Year: Not on file  Transportation Needs:   . Lack of Transportation (Medical): Not on file  . Lack of Transportation (Non-Medical): Not on file  Physical Activity:   . Days of Exercise per Week: Not on file  .  Minutes of Exercise per Session: Not on file  Stress:   . Feeling of Stress : Not on file  Social Connections:   . Frequency of Communication with Friends and Family: Not on file  . Frequency of Social Gatherings with Friends and Family: Not on file  . Attends Religious Services: Not on file  . Active Member of Clubs or Organizations: Not on file  . Attends Archivist Meetings: Not on file  . Marital Status: Not on file     Family History: The patient's family history includes Brain cancer in her brother; Colon cancer in her sister; Diabetes in her father; Heart attack in her sister; Heart disease in her father and  mother; Hypertension in her mother; Pancreatic cancer in her brother and sister; Uterine cancer in her sister.  ROS:   Please see the history of present illness.    Review of Systems  Constitutional: Negative for chills and fever.  HENT: Positive for hearing loss.   Eyes: Negative for blurred vision.  Respiratory: Negative for cough.   Cardiovascular: Positive for leg swelling. Negative for chest pain, palpitations, orthopnea and claudication.  Gastrointestinal: Negative for abdominal pain and nausea.  Genitourinary: Negative for hematuria.  Musculoskeletal: Positive for joint pain and myalgias.  Skin: Negative for rash.  Neurological: Negative for dizziness and focal weakness.  Psychiatric/Behavioral: Positive for memory loss.    EKGs/Labs/Other Studies Reviewed:    The following studies were reviewed today: TTE 2016/10/10: Study Conclusions   - Left ventricle: The cavity size was normal. There was mild focal  basal hypertrophy of the septum. Systolic function was normal.  The estimated ejection fraction was in the range of 60% to 65%.  Wall motion was normal; there were no regional wall motion  abnormalities. Doppler parameters are consistent with abnormal  left ventricular relaxation (grade 1 diastolic dysfunction).  - Aortic valve: There was no  stenosis. There was trivial  regurgitation.  - Mitral valve: Mildly calcified annulus. There was no significant  regurgitation.  - Right ventricle: The cavity size was normal. Systolic function  was normal.  - Pulmonary arteries: No complete TR doppler jet so unable to  estimate PA systolic pressure.  - Inferior vena cava: The vessel was normal in size. The  respirophasic diameter changes were in the normal range (= 50%),  consistent with normal central venous pressure.   Impressions:   - Normal LV size with mild focal basal septal hypertrophy. EF  60-65%. Normal RV size and systolic function. No significant  valvular abnormalities.   Nuclear stress 11-Oct-2015:  Nuclear stress EF: 72%.  There was no ST segment deviation noted during stress.  Defect 1: There is a large defect of severe severity present in the apex location.  Defect 2: There is a medium defect of severe severity present in the mid inferoseptal location.  Defect 3: There is a small defect of moderate severity present in the basal inferolateral location.   Technically challenging study; probable prior distal anterior infarct with mild peri-infarct ischemia, inferoseptal thinning and mild ischemia and thinning of the basal inferior lateral wall; EF 72 with normal wall motion.  Cath 10-10-16: Conclusions: 1. Significant but noncritical single vessel coronary artery disease involving the LAD and its major diagonal branch, as described below. 2. Mild, nonobstructive CAD involving the mid RCA. 3. Low normal left ventricular filling pressure.  Recommendations: 1. Medical therapy of noncritical CAD in the setting of atypical chest pain. Given recent nausea/vomiting, diarrhea, and leukocytosis, gastroenteritis could also be a cause of the patient's acute chest pain this morning. 2. Obtain transthoracic echocardiogram to evaluate LV function. 3. IV hydration, given increased GI losses. 4. Initiate carvedilol 3.125  mg twice a day, to be up titrated as heart rate and blood pressure allow. 5. Obtain influenza and stool studies to evaluate for potential etiologies of patient's GI symptoms.     ABI 10/10/17: IMPRESSION: The ankle-brachial indices are 1 bilaterally and dorsalis pedis waveforms are triphasic. There are some abnormalities described above, however the likelihood of arterial insufficiency in the lower extremities is low.  Bilateral LE ultrasound (venous): Summary:  RIGHT:  - No evidence of common femoral vein obstruction.    LEFT:  -  No evidence of deep vein thrombosis in the lower extremity. No indirect  evidence of obstruction proximal to the inguinal ligament.  - No cystic structure found in the popliteal fossa.   EKG:  EKG is  ordered today.  The ekg ordered today demonstrates NSR with HR 68;  possible prior anterior infarct. No block.  Recent Labs: No results found for requested labs within last 8760 hours.  Recent Lipid Panel    Component Value Date/Time   CHOL 149 08/15/2016 0040   TRIG 83 08/15/2016 0040   HDL 44 08/15/2016 0040   CHOLHDL 3.4 08/15/2016 0040   VLDL 17 08/15/2016 0040   LDLCALC 88 08/15/2016 0040   LDLDIRECT 167.7 05/14/2011 1047     Physical Exam:    VS:  BP (!) 150/70   Pulse 69   Ht 5' (1.524 m)   Wt 126 lb 6.4 oz (57.3 kg)   LMP 06/29/1976   SpO2 96%   BMI 24.69 kg/m     Wt Readings from Last 3 Encounters:  04/22/20 126 lb 6.4 oz (57.3 kg)  02/02/18 142 lb (64.4 kg)  08/10/17 140 lb (63.5 kg)     GEN:  Well nourished, well developed in no acute distress HEENT: Normal NECK: No JVD; No carotid bruits CARDIAC: RR, 2/6 SEM best heard at RUSB RESPIRATORY:  Clear to auscultation without rales, wheezing or rhonchi  ABDOMEN: Soft, non-tender, non-distended MUSCULOSKELETAL:  Trace ankle edema, warm SKIN: Warm and dry NEUROLOGIC:  Alert and oriented x 3 PSYCHIATRIC:  Normal affect   ASSESSMENT:    1. Coronary artery disease involving  native coronary artery of native heart without angina pectoris   2. Pure hypercholesterolemia   3. Primary hypertension    PLAN:    In order of problems listed above:  #Non-obstructive Coronary artery disease Patient with history of 70% mid-LAD, 80% distal LAD on last cath in 2018. No ischemic symptoms currently. Was intolerant to statins several years ago, however, unable to see which ones were used. Has allergy to zetia. Not able to tolerate ASA due to history of ICH. -No ischemic symptoms currently -Unable to tolerate ASA due to history of SAH -Will trial crestor 10mg  daily as would benefit from statin therapy -Repeat labs with PCP at follow-up  #Prior [facility] with memory loss: Occurred in 2008 after fall with headstrike. Had significant memory loss following the event but made remarkable recovery. -Follow-up with PCP as scheduled -No ASA, AC or NSAIDs  #HLD: LDL 166, HDL 49 in setting of significant, nonobstructive LAD disease. Had tried statins previously which were stopped due to muscle aches (unable to see what she tried). She does not recall having a reaction and is willing to try again. -Start crestor 10mg ; follow-up labs with PCP -Has allergy to zetia  #HTN:  Well controlled in the AM, but high in the late afternoons. PCP is managing and adjusting medication dosing accordingly. -Follow-up with PCP as scheduled -Continue lsinopril     Medication Adjustments/Labs and Tests Ordered: Current medicines are reviewed at length with the patient today.  Concerns regarding medicines are outlined above.  No orders of the defined types were placed in this encounter.  Meds ordered this encounter  Medications  . rosuvastatin (CRESTOR) 10 MG tablet    Sig: Take 1 tablet (10 mg total) by mouth daily.    Dispense:  90 tablet    Refill:  3    Patient Instructions  Medication Instructions:  Your physician has recommended you make  the following change in your medication: Start  Rosuvastatin 10 mg by mouth daily  *If you need a refill on your cardiac medications before your next appointment, please call your pharmacy*   Lab Work: Have follow up lab work done at your primary care to follow up on your cholesterol If you have labs (blood work) drawn today and your tests are completely normal, you will receive your results only by: Marland Kitchen MyChart Message (if you have MyChart) OR . A paper copy in the mail If you have any lab test that is abnormal or we need to change your treatment, we will call you to review the results.   Testing/Procedures: None    Follow-Up: At [facility], you and your health needs are our priority.  As part of our continuing mission to provide you with exceptional heart care, we have created designated Provider Care Teams.  These Care Teams include your primary Cardiologist (physician) and Advanced Practice Providers (APPs -  Physician Assistants and Nurse Practitioners) who all work together to provide you with the care you need, when you need it.  We recommend signing up for the patient portal called "MyChart".  Sign up information is provided on this After Visit Summary.  MyChart is used to connect with patients for Virtual Visits (Telemedicine).  Patients are able to view lab/test results, encounter notes, upcoming appointments, etc.  Non-urgent messages can be sent to your provider as well.   To learn more about what you can do with MyChart, go to [email or web address].    Your next appointment:   6 months  The format for your next appointment:   In Person  Provider:   You may see Freada Bergeron, MD or one of the following Advanced Practice Providers on your designated Care Team:    Richardson Dopp, PA-C  Robbie Lis, [place]    Other Instructions      Signed, Freada Bergeron, MD  04/22/2020 2:45 PM    [place]

## 2020-04-22 ENCOUNTER — Encounter: Payer: Self-pay | Admitting: Cardiology

## 2020-04-22 ENCOUNTER — Other Ambulatory Visit: Payer: Self-pay

## 2020-04-22 ENCOUNTER — Ambulatory Visit (INDEPENDENT_AMBULATORY_CARE_PROVIDER_SITE_OTHER): Payer: Medicare Other | Admitting: Cardiology

## 2020-04-22 VITALS — BP 150/70 | HR 69 | Ht 60.0 in | Wt 126.4 lb

## 2020-04-22 DIAGNOSIS — I2510 Atherosclerotic heart disease of native coronary artery without angina pectoris: Secondary | ICD-10-CM | POA: Diagnosis not present

## 2020-04-22 DIAGNOSIS — I10 Essential (primary) hypertension: Secondary | ICD-10-CM | POA: Diagnosis not present

## 2020-04-22 DIAGNOSIS — E7800 Pure hypercholesterolemia, unspecified: Secondary | ICD-10-CM | POA: Diagnosis not present

## 2020-04-22 MED ORDER — ROSUVASTATIN CALCIUM 10 MG PO TABS: 10.0000 mg | ORAL_TABLET | Freq: Every day | ORAL | 3 refills | Status: DC

## 2020-04-22 NOTE — Patient Instructions (Signed)
Medication Instructions:  Your physician has recommended you make the following change in your medication: Start Rosuvastatin 10 mg by mouth daily  *If you need a refill on your cardiac medications before your next appointment, please call your pharmacy*   Lab Work: Have follow up lab work done at your primary care to follow up on your cholesterol If you have labs (blood work) drawn today and your tests are completely normal, you will receive your results only by: Marland Kitchen MyChart Message (if you have MyChart) OR . A paper copy in the mail If you have any lab test that is abnormal or we need to change your treatment, we will call you to review the results.   Testing/Procedures: None    Follow-Up: At [facility], you and your health needs are our priority.  As part of our continuing mission to provide you with exceptional heart care, we have created designated Provider Care Teams.  These Care Teams include your primary Cardiologist (physician) and Advanced Practice Providers (APPs -  Physician Assistants and Nurse Practitioners) who all work together to provide you with the care you need, when you need it.  We recommend signing up for the patient portal called "MyChart".  Sign up information is provided on this After Visit Summary.  MyChart is used to connect with patients for Virtual Visits (Telemedicine).  Patients are able to view lab/test results, encounter notes, upcoming appointments, etc.  Non-urgent messages can be sent to your provider as well.   To learn more about what you can do with MyChart, go to [email or web address].    Your next appointment:   6 months  The format for your next appointment:   In Person  Provider:   You may see Freada Bergeron, MD or one of the following Advanced Practice Providers on your designated Care Team:    Richardson Dopp, PA-C  Robbie Lis, [place]    Other Instructions

## 2020-04-25 NOTE — Addendum Note (Signed)
Addended by: Gar Ponto on: 04/25/2020 03:27 PM   Modules accepted: Orders

## 2020-05-15 DIAGNOSIS — M353 Polymyalgia rheumatica: Secondary | ICD-10-CM | POA: Diagnosis not present

## 2020-05-15 DIAGNOSIS — I10 Essential (primary) hypertension: Secondary | ICD-10-CM | POA: Diagnosis not present

## 2020-05-15 DIAGNOSIS — I872 Venous insufficiency (chronic) (peripheral): Secondary | ICD-10-CM | POA: Diagnosis not present

## 2020-05-17 DIAGNOSIS — Z961 Presence of intraocular lens: Secondary | ICD-10-CM | POA: Diagnosis not present

## 2020-05-17 DIAGNOSIS — H04123 Dry eye syndrome of bilateral lacrimal glands: Secondary | ICD-10-CM | POA: Diagnosis not present

## 2020-05-17 DIAGNOSIS — H35363 Drusen (degenerative) of macula, bilateral: Secondary | ICD-10-CM | POA: Diagnosis not present

## 2020-05-17 DIAGNOSIS — H40013 Open angle with borderline findings, low risk, bilateral: Secondary | ICD-10-CM | POA: Diagnosis not present

## 2020-05-18 DIAGNOSIS — I10 Essential (primary) hypertension: Secondary | ICD-10-CM | POA: Diagnosis not present

## 2020-05-18 DIAGNOSIS — E7800 Pure hypercholesterolemia, unspecified: Secondary | ICD-10-CM | POA: Diagnosis not present

## 2020-06-24 DIAGNOSIS — I10 Essential (primary) hypertension: Secondary | ICD-10-CM | POA: Diagnosis not present

## 2020-06-24 DIAGNOSIS — G6289 Other specified polyneuropathies: Secondary | ICD-10-CM | POA: Diagnosis not present

## 2020-06-24 DIAGNOSIS — M353 Polymyalgia rheumatica: Secondary | ICD-10-CM | POA: Diagnosis not present

## 2020-07-09 DIAGNOSIS — I10 Essential (primary) hypertension: Secondary | ICD-10-CM | POA: Diagnosis not present

## 2020-07-09 DIAGNOSIS — E7800 Pure hypercholesterolemia, unspecified: Secondary | ICD-10-CM | POA: Diagnosis not present

## 2020-08-12 DIAGNOSIS — G5762 Lesion of plantar nerve, left lower limb: Secondary | ICD-10-CM | POA: Diagnosis not present

## 2020-08-14 DIAGNOSIS — R399 Unspecified symptoms and signs involving the genitourinary system: Secondary | ICD-10-CM | POA: Diagnosis not present

## 2020-08-27 DIAGNOSIS — Z79899 Other long term (current) drug therapy: Secondary | ICD-10-CM | POA: Diagnosis not present

## 2020-08-27 DIAGNOSIS — F419 Anxiety disorder, unspecified: Secondary | ICD-10-CM | POA: Diagnosis not present

## 2020-08-27 DIAGNOSIS — N3000 Acute cystitis without hematuria: Secondary | ICD-10-CM | POA: Diagnosis not present

## 2020-08-27 DIAGNOSIS — I10 Essential (primary) hypertension: Secondary | ICD-10-CM | POA: Diagnosis not present

## 2020-09-03 ENCOUNTER — Other Ambulatory Visit: Payer: Self-pay | Admitting: Geriatric Medicine

## 2020-09-03 DIAGNOSIS — Z1231 Encounter for screening mammogram for malignant neoplasm of breast: Secondary | ICD-10-CM

## 2020-09-24 DIAGNOSIS — M542 Cervicalgia: Secondary | ICD-10-CM | POA: Diagnosis not present

## 2020-09-24 DIAGNOSIS — F419 Anxiety disorder, unspecified: Secondary | ICD-10-CM | POA: Diagnosis not present

## 2020-09-24 DIAGNOSIS — I10 Essential (primary) hypertension: Secondary | ICD-10-CM | POA: Diagnosis not present

## 2020-10-07 DIAGNOSIS — M546 Pain in thoracic spine: Secondary | ICD-10-CM | POA: Diagnosis not present

## 2020-10-07 DIAGNOSIS — M542 Cervicalgia: Secondary | ICD-10-CM | POA: Diagnosis not present

## 2020-10-14 DIAGNOSIS — Z853 Personal history of malignant neoplasm of breast: Secondary | ICD-10-CM | POA: Diagnosis not present

## 2020-10-14 DIAGNOSIS — Z9189 Other specified personal risk factors, not elsewhere classified: Secondary | ICD-10-CM | POA: Diagnosis not present

## 2020-10-14 DIAGNOSIS — N762 Acute vulvitis: Secondary | ICD-10-CM | POA: Diagnosis not present

## 2020-10-15 DIAGNOSIS — M546 Pain in thoracic spine: Secondary | ICD-10-CM | POA: Diagnosis not present

## 2020-10-15 DIAGNOSIS — M542 Cervicalgia: Secondary | ICD-10-CM | POA: Diagnosis not present

## 2020-10-17 DIAGNOSIS — E7800 Pure hypercholesterolemia, unspecified: Secondary | ICD-10-CM | POA: Diagnosis not present

## 2020-10-17 DIAGNOSIS — Z853 Personal history of malignant neoplasm of breast: Secondary | ICD-10-CM | POA: Diagnosis not present

## 2020-10-17 DIAGNOSIS — I10 Essential (primary) hypertension: Secondary | ICD-10-CM | POA: Diagnosis not present

## 2020-10-23 DIAGNOSIS — M542 Cervicalgia: Secondary | ICD-10-CM | POA: Diagnosis not present

## 2020-10-23 DIAGNOSIS — M546 Pain in thoracic spine: Secondary | ICD-10-CM | POA: Diagnosis not present

## 2020-10-23 DIAGNOSIS — H6123 Impacted cerumen, bilateral: Secondary | ICD-10-CM | POA: Diagnosis not present

## 2020-10-25 ENCOUNTER — Ambulatory Visit
Admission: RE | Admit: 2020-10-25 | Discharge: 2020-10-25 | Disposition: A | Discharge status: Home / Self Care | Payer: Federal, State, Local not specified - PPO | Source: Ambulatory Visit | Attending: Geriatric Medicine | Admitting: Geriatric Medicine

## 2020-10-25 ENCOUNTER — Other Ambulatory Visit: Payer: Self-pay

## 2020-10-25 DIAGNOSIS — Z1231 Encounter for screening mammogram for malignant neoplasm of breast: Secondary | ICD-10-CM

## 2020-10-29 DIAGNOSIS — H6121 Impacted cerumen, right ear: Secondary | ICD-10-CM | POA: Diagnosis not present

## 2020-10-30 DIAGNOSIS — M546 Pain in thoracic spine: Secondary | ICD-10-CM | POA: Diagnosis not present

## 2020-10-30 DIAGNOSIS — M542 Cervicalgia: Secondary | ICD-10-CM | POA: Diagnosis not present

## 2020-11-03 NOTE — Progress Notes (Signed)
Cardiology Office Note:    Date:  11/06/2020   ID:  Shannon Serrano, DOB 21-Dec-1928, MRN [id number]  PCP:  [name], MD  [facility] Cardiologist:  Freada Bergeron, MD  [facility] Electrophysiologist:  None   Referring MD: [name], MD    History of Present Illness:    Shannon Serrano is a 87 y.o. female with a hx of non-obstructive CAD (70% mid-LAD; 80% distal LAD in 2018), HTN, HLD, solitary right kidney, traumatic brain injury, TIA, breast cancer, and prior DVT who returns to clinic for follow-up.  Patient had a fall in 2008 and hit her head resulting in a [place] requiring brain surgery and evacuation. Had memory loss after the event and required skilled nursing/assisted living. Had to re-learn to read, write, add/substract and has made great recovery over that time. She now lives on her own and is able to drive again. She is "slowly getting her life back" and is looking to ensure she has a cardiologist to follow-up with.  Last seen in clinic on 04/22/21 where she was doing well. No medication changes at that time.  Today, the patient states she has left sided neck pain and was seen by Dr. Felipa Eth who referred her to PT. Has received acupuncture which has helped some but it continues to bother her. She is hoping to see an orthopedic surgeon or sports medicine physician for possible injection therapy. From a CV standpoint, she is doing well with no SOB, chest pain, palpitations, or lightheadedness. Has chronic venous insufficiency. Improves with raising her legs. Tolerating all medications as prescribed. Has not had issues with the statin. Planned to see Dr. Felipa Eth for labs next week.  Of note, cath in 2018 revealed significant but non-obstructive LAD and diag disease. Previously tried statins but stopped due to myalgias (unable to see which statins were attempted in the past). Allergy to zetia. Not able to take ASA given history of ICH.  Past Medical History:   Diagnosis Date  . Clavicle fracture 04/2004  . 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 Diverticulosis   . DVT (deep venous thrombosis) ([place]) 11/2007   Left leg- on lovenox x 1 injection only   . Global amnesia 11/2006  . Hearing loss   . History of fibromyalgia   . History of hysterectomy    History of hysterectomy for fibroids She still has her ovaries  . Hyperlipidemia    History of hyperlipidemia  . Hypertension   . Kidney disease    stage 4  . S/P radiation therapy 07/22/10 - 08/11/10   Whole Left Breast/42.56 Gy/16 Fradctions  . 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 Skin cancer L Lower Leg  . Skin cancer, basal cell    leg  . Stroke ([place]) 12/31/2007   mini stroke / has had several   . Syncope   . Varicose veins of legs     Past Surgical History:  Procedure Laterality Date  . APPENDECTOMY  1948  . BRAIN SURGERY     times 2  . BREAST LUMPECTOMY Left 06/18/10    LEFT BREAST -HIGHG RADE DUCTAL CARCINOMA INSITU, ER+, PR+,  . DOPPLER ECHOCARDIOGRAPHY  05/20/2007   EF 55-60%  . eye surgery  08;09   excessive oil film removed from eyes  . INTRACAPSULAR CATARACT EXTRACTION Bilateral    She also had cataract surgery on the right eye  . LAPAROSCOPIC CHOLECYSTECTOMY  2001;  2004  . LEFT HEART CATH AND CORONARY ANGIOGRAPHY N/A 08/14/2016   Procedure: Left Heart Cath and Coronary Angiography;  Surgeon: Nelva Bush, MD;  Location: [facility];  Service: Cardiovascular;  Laterality: N/A;  . VAGINAL HYSTERECTOMY  1978   fibroids    Current Medications: Current Meds  Medication Sig  . acetaminophen (TYLENOL) 325 MG tablet Take 325 mg by mouth as needed.  Marland Kitchen amLODipine (NORVASC) 2.5 MG tablet Take 2.5 mg by mouth daily.  . calcium-vitamin D (OSCAL WITH D) 500-200 MG-UNIT TABS tablet Take 1 tablet by mouth daily.  . cetirizine (ZYRTEC) 10 MG tablet Take by mouth.  . Cranberry 200 MG CAPS Take by mouth.  . hydrOXYzine  (ATARAX/VISTARIL) 25 MG tablet Take 25 mg by mouth 3 (three) times daily as needed.  . levETIRAcetam (KEPPRA) 250 MG tablet Take 250 mg by mouth 2 (two) times daily.  Marland Kitchen lisinopril (ZESTRIL) 10 MG tablet Take 10 mg by mouth daily.  . meclizine (ANTIVERT) 25 MG tablet Take 25 mg by mouth 3 (three) times daily as needed for dizziness.  . metoprolol succinate (TOPROL-XL) 25 MG 24 hr tablet Take 25 mg by mouth daily.  . rosuvastatin (CRESTOR) 10 MG tablet Take 1 tablet (10 mg total) by mouth daily.  Marland Kitchen White Petrolatum-Mineral Oil ([place] PETROL-MINERAL OIL-LANOLIN) 0.1-0.1 % OINT Place 1 application into both eyes nightly as needed.     Allergies:   Morphine, Morphine and related, Aspirin, Cefpodoxime, Ciprofloxacin, Codeine, Diphenhydramine, Diphenhydramine hcl, Eszopiclone, Ezetimibe, Gabapentin, Lidocaine, Melatonin, Nitrofurantoin, Oxycodone-acetaminophen, Penicillins, Sodium phosphate, Valacyclovir, and Sulfa antibiotics   Social History   Socioeconomic History  . Marital status: Widowed    Spouse name: Not on file  . Number of children: 4  . Years of education: Not on file  . Highest education level: Not on file  Occupational History  . Occupation: ADMIN ASST    Employer: [employer]  Tobacco Use  . Smoking status: Never Smoker  . Smokeless tobacco: Never Used  Vaping Use  . Vaping Use: Never used  Substance and Sexual Activity  . Alcohol use: No    Alcohol/week: 0.0 standard drinks  . Drug use: No  . Sexual activity: Never    Partners: Male    Birth control/protection: Post-menopausal    Comment: hysterectomy  Other Topics Concern  . Not on file  Social History Narrative  . Not on file   Social Determinants of Health   Financial Resource Strain: Not on file  Food Insecurity: Not on file  Transportation Needs: Not on file  Physical Activity: Not on file  Stress: Not on file  Social Connections: Not on file     Family History: The patient's family history includes  Brain cancer in her brother; Colon cancer in her sister; Diabetes in her father; Heart attack in her sister; Heart disease in her father and mother; Hypertension in her mother; Pancreatic cancer in her brother and sister; Uterine cancer in her sister.  ROS:   Please see the history of present illness.    Review of Systems  Constitutional: Negative for fever and malaise/fatigue.  HENT: Positive for hearing loss. Negative for sore throat.   Eyes: Negative for blurred vision.  Respiratory: Negative for cough and shortness of breath.   Cardiovascular: Positive for leg swelling. Negative for chest pain, palpitations, orthopnea, claudication and PND.  Gastrointestinal: Negative for abdominal pain, melena and nausea.  Genitourinary: Negative for flank pain and hematuria.  Musculoskeletal: Positive for joint pain,  myalgias and neck pain.  Skin: Negative for rash.  Neurological: Negative for dizziness and focal weakness.  Psychiatric/Behavioral: Negative for substance abuse.    EKGs/Labs/Other Studies Reviewed:    The following studies were reviewed today: TTE October 23, 2016: Study Conclusions   - Left ventricle: The cavity size was normal. There was mild focal  basal hypertrophy of the septum. Systolic function was normal.  The estimated ejection fraction was in the range of 60% to 65%.  Wall motion was normal; there were no regional wall motion  abnormalities. Doppler parameters are consistent with abnormal  left ventricular relaxation (grade 1 diastolic dysfunction).  - Aortic valve: There was no stenosis. There was trivial  regurgitation.  - Mitral valve: Mildly calcified annulus. There was no significant  regurgitation.  - Right ventricle: The cavity size was normal. Systolic function  was normal.  - Pulmonary arteries: No complete TR doppler jet so unable to  estimate PA systolic pressure.  - Inferior vena cava: The vessel was normal in size. The  respirophasic diameter  changes were in the normal range (= 50%),  consistent with normal central venous pressure.   Impressions:   - Normal LV size with mild focal basal septal hypertrophy. EF  60-65%. Normal RV size and systolic function. No significant  valvular abnormalities.   Nuclear stress 10/24/2015:  Nuclear stress EF: 72%.  There was no ST segment deviation noted during stress.  Defect 1: There is a large defect of severe severity present in the apex location.  Defect 2: There is a medium defect of severe severity present in the mid inferoseptal location.  Defect 3: There is a small defect of moderate severity present in the basal inferolateral location.   Technically challenging study; probable prior distal anterior infarct with mild peri-infarct ischemia, inferoseptal thinning and mild ischemia and thinning of the basal inferior lateral wall; EF 72 with normal wall motion.  Cath 10/23/16: Conclusions: 1. Significant but noncritical single vessel coronary artery disease involving the LAD and its major diagonal branch, as described below. 2. Mild, nonobstructive CAD involving the mid RCA. 3. Low normal left ventricular filling pressure.  Recommendations: 1. Medical therapy of noncritical CAD in the setting of atypical chest pain. Given recent nausea/vomiting, diarrhea, and leukocytosis, gastroenteritis could also be a cause of the patient's acute chest pain this morning. 2. Obtain transthoracic echocardiogram to evaluate LV function. 3. IV hydration, given increased GI losses. 4. Initiate carvedilol 3.125 mg twice a day, to be up titrated as heart rate and blood pressure allow. 5. Obtain influenza and stool studies to evaluate for potential etiologies of patient's GI symptoms.     ABI 2017/10/23: IMPRESSION: The ankle-brachial indices are 1 bilaterally and dorsalis pedis waveforms are triphasic. There are some abnormalities described above, however the likelihood of arterial insufficiency in the  lower extremities is low.  Bilateral LE ultrasound (venous): Summary:  RIGHT:  - No evidence of common femoral vein obstruction.    LEFT:  - No evidence of deep vein thrombosis in the lower extremity. No indirect  evidence of obstruction proximal to the inguinal ligament.  - No cystic structure found in the popliteal fossa.   EKG:  No ECG obtained today  Recent Labs: No results found for requested labs within last 8760 hours.  Recent Lipid Panel    Component Value Date/Time   CHOL 149 08/15/2016 0040   TRIG 83 08/15/2016 0040   HDL 44 08/15/2016 0040   CHOLHDL 3.4 08/15/2016 0040   VLDL 17  08/15/2016 0040   LDLCALC 88 08/15/2016 0040   LDLDIRECT 167.7 05/14/2011 1047     Physical Exam:    VS:  BP (!) 142/80   Pulse 62   Ht 5' (1.524 m)   Wt 124 lb (56.2 kg)   LMP 06/29/1976   SpO2 96%   BMI 24.22 kg/m     Wt Readings from Last 3 Encounters:  11/06/20 124 lb (56.2 kg)  04/22/20 126 lb 6.4 oz (57.3 kg)  02/02/18 142 lb (64.4 kg)     GEN:  Elderly female, NAD HEENT: Normal NECK: No JVD; No carotid bruits CARDIAC: RR, 2/6 SEM best heard at RUSB. No rubs or gallops. RESPIRATORY:  Clear to auscultation without rales, wheezing or rhonchi  ABDOMEN: Soft, non-tender, non-distended MUSCULOSKELETAL:  Trace pedal edema, warm SKIN: Warm and dry NEUROLOGIC:  Alert and oriented x 3 PSYCHIATRIC:  Normal affect   ASSESSMENT:    1. Coronary artery disease involving native coronary artery of native heart without angina pectoris   2. Neck pain   3. Pure hypercholesterolemia   4. Primary hypertension   5. History of subarachnoid hemorrhage    PLAN:    In order of problems listed above:  #Non-obstructive Coronary artery disease Patient with history of 70% mid-LAD, 80% distal LAD on last cath in 2018. No ischemic symptoms currently. Was intolerant to statins several years ago,but is tolerating crestor currently. Has allergy to zetia. Not able to tolerate ASA due to  history of ICH. -No ischemic symptoms currently -Unable to tolerate ASA due to history of SAH -Continue crestor 10mg  daily; plan for repeat labs with PCP next week  #Prior SAH with memory loss: Occurred in 2008 after fall with headstrike. Had significant memory loss following the event but made remarkable recovery. -Follow-up with PCP as scheduled -No ASA, AC or NSAIDs  #HLD: LDL 166, HDL 49 in setting of significant, nonobstructive LAD disease. Had tried statins previously which were stopped due to muscle aches. Currently tolerating crestor today.  -Continue crestor 10mg  -Has allergy to zetia -Check lipids with PCP next week  #HTN:  Has been fluctuating from 100-150s. Mainly 140s at home. Followed by Dr. Felipa Eth with planned follow-up next week. -Follow-up with PCP as scheduled -Continue blood pressure log -Continue amlodipine 2.5mg  daily -Continue lisinopril 10mg  daily -Continue metoprolol 25mg  daily  #Neck Pain: Sounds consistent with nerve compression. PT has not helped much.  -Refer to Dr. Tamala Julian  Medication Adjustments/Labs and Tests Ordered: Current medicines are reviewed at length with the patient today.  Concerns regarding medicines are outlined above.  Orders Placed This Encounter  Procedures  . AMB referral to sports medicine   No orders of the defined types were placed in this encounter.   Patient Instructions  Medication Instructions:   Your physician recommends that you continue on your current medications as directed. Please refer to the Current Medication list given to you today.  *If you need a refill on your cardiac medications before your next appointment, please call your pharmacy*   You have been referred to [place]: At [facility], you and your health needs are our priority.  As part of our continuing mission to provide you with exceptional heart care, we have created designated  Provider Care Teams.  These Care Teams include your primary Cardiologist (physician) and Advanced Practice Providers (APPs -  Physician Assistants and Nurse Practitioners) who all work together to provide  you with the care you need, when you need it.  We recommend signing up for the patient portal called "MyChart".  Sign up information is provided on this After Visit Summary.  MyChart is used to connect with patients for Virtual Visits (Telemedicine).  Patients are able to view lab/test results, encounter notes, upcoming appointments, etc.  Non-urgent messages can be sent to your provider as well.   To learn more about what you can do with MyChart, go to [email or web address].    Your next appointment:   6 month(s)  The format for your next appointment:   In Person  Provider:   You will see one of the following Advanced Practice Providers on your designated Care Team:    Richardson Dopp, PA-C  [place], [place]  Cecilie Kicks NP  DAYNA DUNN PA-C  [facility] PA-C  JILL Pitstick NP        Signed, Freada Bergeron, MD  11/06/2020 1:22 PM    [place]

## 2020-11-06 ENCOUNTER — Other Ambulatory Visit: Payer: Self-pay

## 2020-11-06 ENCOUNTER — Ambulatory Visit (INDEPENDENT_AMBULATORY_CARE_PROVIDER_SITE_OTHER): Payer: Medicare Other | Admitting: Cardiology

## 2020-11-06 ENCOUNTER — Encounter: Payer: Self-pay | Admitting: Cardiology

## 2020-11-06 VITALS — BP 142/80 | HR 62 | Ht 60.0 in | Wt 124.0 lb

## 2020-11-06 DIAGNOSIS — I10 Essential (primary) hypertension: Secondary | ICD-10-CM

## 2020-11-06 DIAGNOSIS — I2510 Atherosclerotic heart disease of native coronary artery without angina pectoris: Secondary | ICD-10-CM | POA: Diagnosis not present

## 2020-11-06 DIAGNOSIS — E7800 Pure hypercholesterolemia, unspecified: Secondary | ICD-10-CM

## 2020-11-06 DIAGNOSIS — Z8679 Personal history of other diseases of the circulatory system: Secondary | ICD-10-CM

## 2020-11-06 DIAGNOSIS — M542 Cervicalgia: Secondary | ICD-10-CM

## 2020-11-06 NOTE — Patient Instructions (Signed)
Medication Instructions:   Your physician recommends that you continue on your current medications as directed. Please refer to the Current Medication list given to you today.  *If you need a refill on your cardiac medications before your next appointment, please call your pharmacy*   You have been referred to [place]: At [facility], you and your health needs are our priority.  As part of our continuing mission to provide you with exceptional heart care, we have created designated Provider Care Teams.  These Care Teams include your primary Cardiologist (physician) and Advanced Practice Providers (APPs -  Physician Assistants and Nurse Practitioners) who all work together to provide you with the care you need, when you need it.  We recommend signing up for the patient portal called "MyChart".  Sign up information is provided on this After Visit Summary.  MyChart is used to connect with patients for Virtual Visits (Telemedicine).  Patients are able to view lab/test results, encounter notes, upcoming appointments, etc.  Non-urgent messages can be sent to your provider as well.   To learn more about what you can do with MyChart, go to [email or web address].    Your next appointment:   6 month(s)  The format for your next appointment:   In Person  Provider:   You will see one of the following Advanced Practice Providers on your designated Care Team:    Richardson Dopp, PA-C  [place], PA-C  Cecilie Kicks NP  DAYNA DUNN PA-C  MICHELE LENZE PA-C  JILL [facility] NP

## 2020-11-14 DIAGNOSIS — H04123 Dry eye syndrome of bilateral lacrimal glands: Secondary | ICD-10-CM | POA: Diagnosis not present

## 2020-11-14 DIAGNOSIS — H18453 Nodular corneal degeneration, bilateral: Secondary | ICD-10-CM | POA: Diagnosis not present

## 2020-11-27 DIAGNOSIS — I10 Essential (primary) hypertension: Secondary | ICD-10-CM | POA: Diagnosis not present

## 2020-11-27 DIAGNOSIS — I700 Atherosclerosis of aorta: Secondary | ICD-10-CM | POA: Diagnosis not present

## 2020-12-10 DIAGNOSIS — Z853 Personal history of malignant neoplasm of breast: Secondary | ICD-10-CM | POA: Diagnosis not present

## 2020-12-10 DIAGNOSIS — I10 Essential (primary) hypertension: Secondary | ICD-10-CM | POA: Diagnosis not present

## 2020-12-10 DIAGNOSIS — E7800 Pure hypercholesterolemia, unspecified: Secondary | ICD-10-CM | POA: Diagnosis not present

## 2020-12-10 NOTE — Progress Notes (Signed)
[place] Phone: [phone] Subjective:   Shannon Serrano, am serving as a scribe for Dr. Hulan Saas.  This visit occurred during the SARS-CoV-2 public health emergency.  Safety protocols were in place, including screening questions prior to the visit, additional usage of staff PPE, and extensive cleaning of exam room while observing appropriate contact time as indicated for disinfecting solutions.    I'm seeing this patient by the request  of:  [name], MD , Johney Frame  CC: Neck pain  [id number]  Shannon Serrano is a 87 y.o. female coming in with complaint of thoracic and cervical spine pain. Had head injury in 2018 after having a stroke.States that she fell during physical therapy and feels that her back pain came from this incident. Does have headaches due to neck pain. Will try to massage back of her neck. 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 Tylenol for neck pain.    Patient has donePhysical therapy previously as well as acupuncture.  Patient states that this has not made any difference.  Past Medical History:  Diagnosis Date   Clavicle fracture 04/2004   DCIS (ductal carcinoma in situ) 2011   left breast   Diverticulosis    DVT (deep venous thrombosis) ([place]) 11/2007   Left leg- on lovenox x 1 injection only    Global amnesia 11/2006   Hearing loss    History of fibromyalgia    History of hysterectomy    History of hysterectomy for fibroids She still has her ovaries   Hyperlipidemia    History of hyperlipidemia   Hypertension    Kidney disease    stage 4   S/P radiation therapy 07/22/10 - 08/11/10   Whole Left Breast/42.56 Gy/16 Fradctions   Single kidney    RIGHT SIDE WITH NONFUNCTIONING LEFT KIDNEY   Skin cancer L Lower Leg   Skin cancer, basal cell    leg   Stroke ([place]) 12/31/2007   mini stroke / has had several    Syncope    Varicose veins of legs     Past Surgical History:  Procedure Laterality Date   APPENDECTOMY  1948   BRAIN SURGERY     times 2   BREAST LUMPECTOMY Left 06/18/10    LEFT BREAST -HIGHG RADE DUCTAL CARCINOMA INSITU, ER+, PR+,   DOPPLER ECHOCARDIOGRAPHY  05/20/2007   EF 55-60%   eye surgery  08;09   excessive oil film removed from eyes   INTRACAPSULAR CATARACT EXTRACTION Bilateral    She also had cataract surgery on the right eye   LAPAROSCOPIC CHOLECYSTECTOMY  2001; 2004   LEFT HEART CATH AND CORONARY ANGIOGRAPHY N/A 08/14/2016   Procedure: Left Heart Cath and Coronary Angiography;  Surgeon: Nelva Bush, MD;  Location: [facility];  Service: Cardiovascular;  Laterality: N/A;   VAGINAL HYSTERECTOMY  1978   fibroids   Social History   Socioeconomic History   Marital status: Widowed    Spouse name: Not on file   Number of children: 4   Years of education: Not on file   Highest education level: Not on file  Occupational History   Occupation: ADMIN ASST    Employer: [employer]  Tobacco Use   Smoking status: Never   Smokeless tobacco: Never  Vaping Use   Vaping Use: Never used  Substance and Sexual Activity   Alcohol use: Serrano    Alcohol/week: 0.0 standard  drinks   Drug use: Serrano   Sexual activity: Never    Partners: Male    Birth control/protection: Post-menopausal    Comment: hysterectomy  Other Topics Concern   Not on file  Social History Narrative   Not on file   Social Determinants of Health   Financial Resource Strain: Not on file  Food Insecurity: Not on file  Transportation Needs: Not on file  Physical Activity: Not on file  Stress: Not on file  Social Connections: Not on file   Allergies  Allergen Reactions   Morphine Nausea And Vomiting   Morphine And Related Nausea And Vomiting   Aspirin Other (See Comments)    CKD STAGE 4    Cefpodoxime    Ciprofloxacin Other (See Comments)    Unable to take due to repeated use for uti   Codeine Nausea Only   Diphenhydramine     Diphenhydramine Hcl Other (See Comments)    ckd stage 4 due to sedative   Eszopiclone Other (See Comments)    Short term memory loss - reaction to Lunesta   Ezetimibe    Gabapentin    Lidocaine    Melatonin    Nitrofurantoin Nausea Only   Oxycodone-Acetaminophen Other (See Comments)    Unknown    Penicillins Nausea Only     Has patient had a PCN reaction causing immediate rash, facial/tongue/throat swelling, SOB or lightheadedness with hypotension: Serrano Has patient had a PCN reaction causing severe rash involving mucus membranes or skin necrosis: Serrano Has patient had a PCN reaction that required hospitalization Serrano Has patient had a PCN reaction occurring within the last 10 years: Serrano If all of the above answers are "Serrano", then may proceed with Cephalosporin use.   Sodium Phosphate    Valacyclovir Other (See Comments)    STAGE 4 CKD    Sulfa Antibiotics Rash   Family History  Problem Relation Age of Onset   Heart disease Mother    Hypertension Mother    Diabetes Father    Heart disease Father    Pancreatic cancer Sister    Brain cancer Brother    Heart attack Sister    Uterine cancer Sister    Colon cancer Sister    Pancreatic cancer Brother      Current Outpatient Medications (Cardiovascular):    amLODipine (NORVASC) 2.5 MG tablet, Take 2.5 mg by mouth daily.   lisinopril (ZESTRIL) 10 MG tablet, Take 10 mg by mouth daily.   metoprolol succinate (TOPROL-XL) 25 MG 24 hr tablet, Take 25 mg by mouth daily.   rosuvastatin (CRESTOR) 10 MG tablet, Take 1 tablet (10 mg total) by mouth daily.  Current Outpatient Medications (Respiratory):    cetirizine (ZYRTEC) 10 MG tablet, Take by mouth.  Current Outpatient Medications (Analgesics):    acetaminophen (TYLENOL) 325 MG tablet, Take 325 mg by mouth as needed.   Current Outpatient Medications (Other):    calcium-vitamin D (OSCAL WITH D) 500-200 MG-UNIT TABS tablet, Take 1 tablet by mouth daily.   Cranberry 200 MG CAPS, Take by  mouth.   hydrOXYzine (ATARAX/VISTARIL) 25 MG tablet, Take 25 mg by mouth 3 (three) times daily as needed.   levETIRAcetam (KEPPRA) 250 MG tablet, Take 250 mg by mouth 2 (two) times daily.   meclizine (ANTIVERT) 25 MG tablet, Take 25 mg by mouth 3 (three) times daily as needed for dizziness.   White Petrolatum-Mineral Oil ([place] PETROL-MINERAL OIL-LANOLIN) 0.1-0.1 % OINT, Place 1 application into both eyes nightly as needed.  Reviewed prior external information including notes and imaging from  primary care provider As well as notes that were available from care everywhere and other healthcare systems.  Past medical history, social, surgical and family history all reviewed in electronic medical record.  Serrano pertanent information unless stated regarding to the chief complaint.   Review of Systems:  Serrano , visual changes, nausea, vomiting, diarrhea, constipation, dizziness, abdominal pain, skin rash, fevers, chills, night sweats, weight loss, swollen lymph nodes, joint swelling, chest pain, shortness of breath, mood changes. POSITIVE muscle aches, headaches  Objective  Blood pressure 110/62, pulse 63, height 5' (1.524 m), weight 124 lb (56.2 kg), last menstrual period 06/29/1976, SpO2 96 %.   General: Serrano apparent distress patient is alert but not completely oriented Patient is accompanied with her daughter HEENT: Pupils equal, extraocular movements intact  Respiratory: Patient's speak in full sentences and does not appear short of breath  Gait mild antalgic MSK:   Patient does have arthritic changes of multiple joints.  Patient's neck has significant decrease in extension of the neck with only 5 degrees.  Patient does have some crepitus noted.  Mild weakness actually noted in the C7 distribution in the C8 distribution left greater than right.  Mild hypothenar eminence wasting noted on the left hand.  Patient does have mild positive Spurling's with any sidebending to the left of the neck.     Impression and Recommendations:    The above documentation has been reviewed and is accurate and complete Lyndal Pulley, DO

## 2020-12-11 ENCOUNTER — Other Ambulatory Visit: Payer: Self-pay

## 2020-12-11 ENCOUNTER — Ambulatory Visit: Payer: Medicare Other

## 2020-12-11 ENCOUNTER — Encounter: Payer: Self-pay | Admitting: Family Medicine

## 2020-12-11 ENCOUNTER — Ambulatory Visit (INDEPENDENT_AMBULATORY_CARE_PROVIDER_SITE_OTHER): Payer: Medicare Other | Admitting: Family Medicine

## 2020-12-11 ENCOUNTER — Ambulatory Visit (INDEPENDENT_AMBULATORY_CARE_PROVIDER_SITE_OTHER): Payer: Medicare Other

## 2020-12-11 VITALS — BP 110/62 | HR 63 | Ht 60.0 in | Wt 124.0 lb

## 2020-12-11 DIAGNOSIS — M5010 Cervical disc disorder with radiculopathy, unspecified cervical region: Secondary | ICD-10-CM | POA: Insufficient documentation

## 2020-12-11 DIAGNOSIS — M546 Pain in thoracic spine: Secondary | ICD-10-CM

## 2020-12-11 DIAGNOSIS — I2510 Atherosclerotic heart disease of native coronary artery without angina pectoris: Secondary | ICD-10-CM

## 2020-12-11 DIAGNOSIS — M542 Cervicalgia: Secondary | ICD-10-CM

## 2020-12-11 DIAGNOSIS — M47812 Spondylosis without myelopathy or radiculopathy, cervical region: Secondary | ICD-10-CM | POA: Diagnosis not present

## 2020-12-11 DIAGNOSIS — M47814 Spondylosis without myelopathy or radiculopathy, thoracic region: Secondary | ICD-10-CM | POA: Diagnosis not present

## 2020-12-11 NOTE — Patient Instructions (Signed)
Good to see you Xrays today MRI for the back and neck  Turmeric 500mg  daily  Tart cherry extract 1200mg  at night Vitamin D 2000 IU daily  We will call or mychart you with results

## 2020-12-11 NOTE — Assessment & Plan Note (Signed)
Patient does have cervical radiculopathy.  Patient does have it on the left side.  Mild weakness noted of the upper extremity.  I do believe with patient is already failing conservative therapies such as physical therapy we will consider the possibility of advanced imaging.  I have found an MRI of the thoracic spine in 2006 but no MRI of the cervical spine.  Patient does not remember if she was actually than truly treated symptoms and outpatient setting before needing having advanced imaging.  At this point I do think MRI would be beneficial and patient could be a candidate for potential epidurals.  Patient has multiple different back allergies to medications which does make treatment difficult.  Discussed potentially some tart cherry extract to help with some of the pain at night.  Encourage vitamin D supplementation.  Depending on imaging we will discuss further treatment options.

## 2020-12-13 ENCOUNTER — Telehealth: Payer: Self-pay | Admitting: Family Medicine

## 2020-12-13 NOTE — Telephone Encounter (Signed)
Patient called to follow up on her xray results. I gave her Dr Thompson Caul response. She asked if the MRI would show both sides of her back. She is concerned the pain is coming from her right side possibly near her kidneys?  Please advise.

## 2020-12-31 ENCOUNTER — Other Ambulatory Visit: Payer: Medicare Other

## 2020-12-31 ENCOUNTER — Ambulatory Visit
Admission: RE | Admit: 2020-12-31 | Discharge: 2020-12-31 | Disposition: A | Discharge status: Home / Self Care | Payer: Medicare Other | Source: Ambulatory Visit | Attending: Family Medicine | Admitting: Family Medicine

## 2020-12-31 ENCOUNTER — Other Ambulatory Visit: Payer: Self-pay

## 2020-12-31 DIAGNOSIS — N281 Cyst of kidney, acquired: Secondary | ICD-10-CM | POA: Diagnosis not present

## 2020-12-31 DIAGNOSIS — M4802 Spinal stenosis, cervical region: Secondary | ICD-10-CM | POA: Diagnosis not present

## 2020-12-31 DIAGNOSIS — M40204 Unspecified kyphosis, thoracic region: Secondary | ICD-10-CM | POA: Diagnosis not present

## 2020-12-31 DIAGNOSIS — M5124 Other intervertebral disc displacement, thoracic region: Secondary | ICD-10-CM | POA: Diagnosis not present

## 2020-12-31 DIAGNOSIS — M542 Cervicalgia: Secondary | ICD-10-CM

## 2020-12-31 DIAGNOSIS — M546 Pain in thoracic spine: Secondary | ICD-10-CM

## 2020-12-31 DIAGNOSIS — M5125 Other intervertebral disc displacement, thoracolumbar region: Secondary | ICD-10-CM | POA: Diagnosis not present

## 2021-01-01 ENCOUNTER — Encounter: Payer: Self-pay | Admitting: Family Medicine

## 2021-01-02 ENCOUNTER — Other Ambulatory Visit: Payer: Self-pay

## 2021-01-02 DIAGNOSIS — M542 Cervicalgia: Secondary | ICD-10-CM

## 2021-01-09 DIAGNOSIS — M5030 Other cervical disc degeneration, unspecified cervical region: Secondary | ICD-10-CM | POA: Diagnosis not present

## 2021-01-15 DIAGNOSIS — L245 Irritant contact dermatitis due to other chemical products: Secondary | ICD-10-CM | POA: Diagnosis not present

## 2021-01-15 DIAGNOSIS — L821 Other seborrheic keratosis: Secondary | ICD-10-CM | POA: Diagnosis not present

## 2021-01-15 DIAGNOSIS — D2272 Melanocytic nevi of left lower limb, including hip: Secondary | ICD-10-CM | POA: Diagnosis not present

## 2021-01-15 DIAGNOSIS — Z85828 Personal history of other malignant neoplasm of skin: Secondary | ICD-10-CM | POA: Diagnosis not present

## 2021-02-18 DIAGNOSIS — Z85828 Personal history of other malignant neoplasm of skin: Secondary | ICD-10-CM | POA: Diagnosis not present

## 2021-02-18 DIAGNOSIS — L900 Lichen sclerosus et atrophicus: Secondary | ICD-10-CM | POA: Diagnosis not present

## 2021-03-20 DIAGNOSIS — K4090 Unilateral inguinal hernia, without obstruction or gangrene, not specified as recurrent: Secondary | ICD-10-CM | POA: Diagnosis not present

## 2021-03-20 DIAGNOSIS — I872 Venous insufficiency (chronic) (peripheral): Secondary | ICD-10-CM | POA: Diagnosis not present

## 2021-03-20 DIAGNOSIS — Z23 Encounter for immunization: Secondary | ICD-10-CM | POA: Diagnosis not present

## 2021-03-20 DIAGNOSIS — N1831 Chronic kidney disease, stage 3a: Secondary | ICD-10-CM | POA: Diagnosis not present

## 2021-03-20 DIAGNOSIS — M542 Cervicalgia: Secondary | ICD-10-CM | POA: Diagnosis not present

## 2021-03-20 DIAGNOSIS — K5901 Slow transit constipation: Secondary | ICD-10-CM | POA: Diagnosis not present

## 2021-03-20 DIAGNOSIS — R300 Dysuria: Secondary | ICD-10-CM | POA: Diagnosis not present

## 2021-03-20 DIAGNOSIS — I129 Hypertensive chronic kidney disease with stage 1 through stage 4 chronic kidney disease, or unspecified chronic kidney disease: Secondary | ICD-10-CM | POA: Diagnosis not present

## 2021-04-03 DIAGNOSIS — L853 Xerosis cutis: Secondary | ICD-10-CM | POA: Diagnosis not present

## 2021-04-03 DIAGNOSIS — Z85828 Personal history of other malignant neoplasm of skin: Secondary | ICD-10-CM | POA: Diagnosis not present

## 2021-04-03 DIAGNOSIS — L900 Lichen sclerosus et atrophicus: Secondary | ICD-10-CM | POA: Diagnosis not present

## 2021-04-10 DIAGNOSIS — N281 Cyst of kidney, acquired: Secondary | ICD-10-CM | POA: Diagnosis not present

## 2021-04-10 DIAGNOSIS — N3020 Other chronic cystitis without hematuria: Secondary | ICD-10-CM | POA: Diagnosis not present

## 2021-04-10 DIAGNOSIS — N390 Urinary tract infection, site not specified: Secondary | ICD-10-CM | POA: Diagnosis not present

## 2021-04-10 DIAGNOSIS — N1831 Chronic kidney disease, stage 3a: Secondary | ICD-10-CM | POA: Diagnosis not present

## 2021-04-10 DIAGNOSIS — N261 Atrophy of kidney (terminal): Secondary | ICD-10-CM | POA: Diagnosis not present

## 2021-04-10 DIAGNOSIS — R82998 Other abnormal findings in urine: Secondary | ICD-10-CM | POA: Diagnosis not present

## 2021-04-10 DIAGNOSIS — R319 Hematuria, unspecified: Secondary | ICD-10-CM | POA: Diagnosis not present

## 2021-05-19 DIAGNOSIS — N1831 Chronic kidney disease, stage 3a: Secondary | ICD-10-CM | POA: Diagnosis not present

## 2021-05-19 DIAGNOSIS — Z418 Encounter for other procedures for purposes other than remedying health state: Secondary | ICD-10-CM | POA: Diagnosis not present

## 2021-05-19 DIAGNOSIS — Z0000 Encounter for general adult medical examination without abnormal findings: Secondary | ICD-10-CM | POA: Diagnosis not present

## 2021-05-19 DIAGNOSIS — S065XAA Traumatic subdural hemorrhage with loss of consciousness status unknown, initial encounter: Secondary | ICD-10-CM | POA: Diagnosis not present

## 2021-05-19 DIAGNOSIS — M5430 Sciatica, unspecified side: Secondary | ICD-10-CM | POA: Diagnosis not present

## 2021-05-19 DIAGNOSIS — I129 Hypertensive chronic kidney disease with stage 1 through stage 4 chronic kidney disease, or unspecified chronic kidney disease: Secondary | ICD-10-CM | POA: Diagnosis not present

## 2021-05-19 DIAGNOSIS — I700 Atherosclerosis of aorta: Secondary | ICD-10-CM | POA: Diagnosis not present

## 2021-05-19 DIAGNOSIS — Z1389 Encounter for screening for other disorder: Secondary | ICD-10-CM | POA: Diagnosis not present

## 2021-05-19 DIAGNOSIS — Z79899 Other long term (current) drug therapy: Secondary | ICD-10-CM | POA: Diagnosis not present

## 2021-05-19 DIAGNOSIS — E7800 Pure hypercholesterolemia, unspecified: Secondary | ICD-10-CM | POA: Diagnosis not present

## 2021-05-19 DIAGNOSIS — E871 Hypo-osmolality and hyponatremia: Secondary | ICD-10-CM | POA: Diagnosis not present

## 2021-05-28 DIAGNOSIS — H35363 Drusen (degenerative) of macula, bilateral: Secondary | ICD-10-CM | POA: Diagnosis not present

## 2021-05-28 DIAGNOSIS — H04123 Dry eye syndrome of bilateral lacrimal glands: Secondary | ICD-10-CM | POA: Diagnosis not present

## 2021-05-28 DIAGNOSIS — H40013 Open angle with borderline findings, low risk, bilateral: Secondary | ICD-10-CM | POA: Diagnosis not present

## 2021-05-28 DIAGNOSIS — Z961 Presence of intraocular lens: Secondary | ICD-10-CM | POA: Diagnosis not present

## 2021-06-17 ENCOUNTER — Other Ambulatory Visit: Payer: Self-pay

## 2021-06-17 ENCOUNTER — Ambulatory Visit (INDEPENDENT_AMBULATORY_CARE_PROVIDER_SITE_OTHER): Payer: Medicare Other | Admitting: Podiatry

## 2021-06-17 ENCOUNTER — Ambulatory Visit (INDEPENDENT_AMBULATORY_CARE_PROVIDER_SITE_OTHER): Payer: Medicare Other

## 2021-06-17 ENCOUNTER — Ambulatory Visit: Payer: Medicare Other

## 2021-06-17 ENCOUNTER — Encounter: Payer: Self-pay | Admitting: Podiatry

## 2021-06-17 DIAGNOSIS — L909 Atrophic disorder of skin, unspecified: Secondary | ICD-10-CM | POA: Diagnosis not present

## 2021-06-17 DIAGNOSIS — M7742 Metatarsalgia, left foot: Secondary | ICD-10-CM

## 2021-06-17 DIAGNOSIS — M79676 Pain in unspecified toe(s): Secondary | ICD-10-CM | POA: Diagnosis not present

## 2021-06-17 DIAGNOSIS — L600 Ingrowing nail: Secondary | ICD-10-CM

## 2021-06-17 DIAGNOSIS — B351 Tinea unguium: Secondary | ICD-10-CM

## 2021-06-17 DIAGNOSIS — M2042 Other hammer toe(s) (acquired), left foot: Secondary | ICD-10-CM

## 2021-06-17 NOTE — Progress Notes (Signed)
°  Subjective:  Patient ID: Shannon Serrano, female    DOB: 16-Mar-1929,   MRN: [id number]  No chief complaint on file.   87 y.o. female presents for concern of bilateral hammertoes as well as concern for ingrown toenails. Relates it is primarily the left hammertoe that is painful. States her nails are thickened elongated and painful. Relates she cannot trim them herself and requesting to have them trimmed.   . Denies any other pedal complaints. Denies n/v/f/c.   Past Medical History:  Diagnosis Date   Clavicle fracture 04/2004   DCIS (ductal carcinoma in situ) 2011   left breast   Diverticulosis    DVT (deep venous thrombosis) ([place]) 11/2007   Left leg- on lovenox x 1 injection only    Global amnesia 11/2006   Hearing loss    History of fibromyalgia    History of hysterectomy    History of hysterectomy for fibroids She still has her ovaries   Hyperlipidemia    History of hyperlipidemia   Hypertension    Kidney disease    stage 4   S/P radiation therapy 07/22/10 - 08/11/10   Whole Left Breast/42.56 Gy/16 Fradctions   Single kidney    RIGHT SIDE WITH NONFUNCTIONING LEFT KIDNEY   Skin cancer L Lower Leg   Skin cancer, basal cell    leg   Stroke ([place]) 12/31/2007   mini stroke / has had several    Syncope    Varicose veins of legs     Objective:  Physical Exam: Vascular: DP/PT pulses 2/4 bilateral. CFT <3 seconds. Normal hair growth on digits. No edema.  Skin. No lacerations or abrasions bilateral feet.  Musculoskeletal: MMT 5/5 bilateral lower extremities in DF, PF, Inversion and Eversion. Deceased ROM in DF of ankle joint. Hammered digits 2-5 bilateral. Pain to ball of foot. Fat pad atrophy noted.  Neurological: Sensation intact to light touch.   Assessment:   1. Hammertoe, bilateral      Plan:  Patient was evaluated and treated and all questions answered. -X-rays reviewed. Hammered digits 2-5 bilateral. No acute fractures or dislocation. -Discussed and educated patient  on foot care, especially with  regards to the vascular, neurological and musculoskeletal systems. t. -Discussed supportive shoes at all times and checking feet regularly.  -Mechanically debrided all nails 1-5 bilateral using sterile nail nipper and filed with dremel without incident  -Answered all patient questions -Patient to return  in 3 months for at risk foot care -Discussed metatrsalgia and treatment options.  -Padding dispensed and discussed supportive shoe gear. -Patient  to return for routine foot care in 3 months.     Lorenda Peck, DPM

## 2021-06-17 NOTE — Progress Notes (Signed)
Dg  

## 2021-06-18 DIAGNOSIS — R300 Dysuria: Secondary | ICD-10-CM | POA: Diagnosis not present

## 2021-06-26 DIAGNOSIS — S065XAD Traumatic subdural hemorrhage with loss of consciousness status unknown, subsequent encounter: Secondary | ICD-10-CM | POA: Diagnosis not present

## 2021-06-26 DIAGNOSIS — I10 Essential (primary) hypertension: Secondary | ICD-10-CM | POA: Diagnosis not present

## 2021-06-26 DIAGNOSIS — N200 Calculus of kidney: Secondary | ICD-10-CM | POA: Diagnosis not present

## 2021-07-06 NOTE — Progress Notes (Signed)
Cardiology Office Note:    Date:  07/08/2021   ID:  Shannon Serrano, DOB [birth year], MRN [id number]  PCP:  [name], MD  [facility] Cardiologist:  Freada Bergeron, MD  [facility] Electrophysiologist:  None   Referring MD: [name], MD    History of Present Illness:    Shannon Serrano is a 88 y.o. female with a hx of non-obstructive CAD (70% mid-LAD; 80% distal LAD in 2018), HTN, HLD, solitary right kidney, traumatic brain injury, TIA, breast cancer, and prior DVT who returns to clinic for follow-up.  Patient had a fall in 2008 and hit her head resulting in a [place] requiring brain surgery and evacuation. Had memory loss after the event and required skilled nursing/assisted living. Had to re-learn to read, write, add/substract and has made great recovery over that time. She now lives on her own and is able to drive again. She is "slowly getting her life back" and is looking to ensure she has a cardiologist to follow-up with.  Of note, cath in 2018 revealed significant but non-obstructive LAD and diag disease. Previously tried statins but stopped due to myalgias (unable to see which statins were attempted in the past). Allergy to zetia. Not able to take ASA given history of ICH.  Last seen in clinic on 10/2020 where she was doing well from CV standpoint.  Today, the patient feels overall well. Has occasional LE pain from chronic venous stasis changes. No chest pain or SOB with exertion. No lightheadedness, dizziness or syncope. Blood pressure is overall fairly well controlled in 130s. Has been tolerating crestor 5mg . Notably has multiple allergies/intolerances limiting ability to manage cholesterol.    Past Medical History:  Diagnosis Date   Clavicle fracture 04/2004   DCIS (ductal carcinoma in situ) 2011   left breast   Diverticulosis    DVT (deep venous thrombosis) ([place]) 11/2007   Left leg- on lovenox x 1 injection only    Global amnesia 11/2006   Hearing loss     History of fibromyalgia    History of hysterectomy    History of hysterectomy for fibroids She still has her ovaries   Hyperlipidemia    History of hyperlipidemia   Hypertension    Kidney disease    stage 4   S/P radiation therapy 07/22/10 - 08/11/10   Whole Left Breast/42.56 Gy/16 Fradctions   Single kidney    RIGHT SIDE WITH NONFUNCTIONING LEFT KIDNEY   Skin cancer L Lower Leg   Skin cancer, basal cell    leg   Stroke ([place]) 12/31/2007   mini stroke / has had several    Syncope    Varicose veins of legs     Past Surgical History:  Procedure Laterality Date   APPENDECTOMY  1948   BRAIN SURGERY     times 2   BREAST LUMPECTOMY Left 06/18/10    LEFT BREAST -HIGHG RADE DUCTAL CARCINOMA INSITU, ER+, PR+,   DOPPLER ECHOCARDIOGRAPHY  05/20/2007   EF 55-60%   eye surgery  08;09   excessive oil film removed from eyes   INTRACAPSULAR CATARACT EXTRACTION Bilateral    She also had cataract surgery on the right eye   LAPAROSCOPIC CHOLECYSTECTOMY  2001; 2004   LEFT HEART CATH AND CORONARY ANGIOGRAPHY N/A 08/14/2016   Procedure: Left Heart Cath and Coronary Angiography;  Surgeon: Nelva Bush, MD;  Location: [place] [facility];  Service: Cardiovascular;  Laterality: N/A;   VAGINAL HYSTERECTOMY  1978   fibroids  Current Medications: Current Meds  Medication Sig   acetaminophen (TYLENOL) 325 MG tablet Take 325 mg by mouth as directed.   amLODipine (NORVASC) 2.5 MG tablet Take 2.5 mg by mouth daily.   Cranberry POWD Take by mouth as directed.   hydrOXYzine (ATARAX/VISTARIL) 25 MG tablet Take 25 mg by mouth 3 (three) times daily as needed.   levETIRAcetam (KEPPRA) 250 MG tablet Take 250 mg by mouth 2 (two) times daily.   lisinopril (ZESTRIL) 10 MG tablet Take 10 mg by mouth daily.   meclizine (ANTIVERT) 25 MG tablet Take 25 mg by mouth 3 (three) times daily as needed for dizziness.   metoprolol succinate (TOPROL-XL) 25 MG 24 hr tablet Take 25 mg by mouth daily.   rosuvastatin  (CRESTOR) 5 MG tablet Take 5 mg by mouth at bedtime.     Allergies:   Morphine, Morphine and related, Aspirin, Cefpodoxime, Ciprofloxacin, Codeine, Diphenhydramine, Diphenhydramine hcl, Eszopiclone, Ezetimibe, Gabapentin, Lidocaine, Melatonin, Nitrofurantoin, Other, Oxycodone-acetaminophen, Penicillins, Phosphate, Sodium phosphate, Valacyclovir, and Sulfa antibiotics   Social History   Socioeconomic History   Marital status: Widowed    Spouse name: Not on file   Number of children: 4   Years of education: Not on file   Highest education level: Not on file  Occupational History   Occupation: ADMIN ASST    Employer: [employer]  Tobacco Use   Smoking status: Never   Smokeless tobacco: Never  Vaping Use   Vaping Use: Never used  Substance and Sexual Activity   Alcohol use: No    Alcohol/week: 0.0 standard drinks   Drug use: No   Sexual activity: Never    Partners: Male    Birth control/protection: Post-menopausal    Comment: hysterectomy  Other Topics Concern   Not on file  Social History Narrative   Not on file   Social Determinants of Health   Financial Resource Strain: Not on file  Food Insecurity: Not on file  Transportation Needs: Not on file  Physical Activity: Not on file  Stress: Not on file  Social Connections: Not on file     Family History: The patient's family history includes Brain cancer in her brother; Colon cancer in her sister; Diabetes in her father; Heart attack in her sister; Heart disease in her father and mother; Hypertension in her mother; Pancreatic cancer in her brother and sister; Uterine cancer in her sister.  ROS:   Please see the history of present illness.    Review of Systems  Constitutional:  Negative for fever and malaise/fatigue.  HENT:  Positive for hearing loss. Negative for sore throat.   Eyes:  Negative for blurred vision.  Respiratory:  Negative for cough and shortness of breath.   Cardiovascular:  Negative for chest pain,  palpitations, orthopnea, claudication, leg swelling and PND.  Gastrointestinal:  Negative for abdominal pain and nausea.  Genitourinary:  Negative for flank pain and hematuria.  Musculoskeletal:  Positive for joint pain.  Skin:  Negative for rash.  Neurological:  Positive for dizziness. Negative for focal weakness.  Psychiatric/Behavioral:  Negative for substance abuse.    EKGs/Labs/Other Studies Reviewed:    The following studies were reviewed today: TTE 13-Sep-2016: Study Conclusions   - Left ventricle: The cavity size was normal. There was mild focal    basal hypertrophy of the septum. Systolic function was normal.    The estimated ejection fraction was in the range of 60% to 65%.    Wall motion was normal; there were no  regional wall motion    abnormalities. Doppler parameters are consistent with abnormal    left ventricular relaxation (grade 1 diastolic dysfunction).  - Aortic valve: There was no stenosis. There was trivial    regurgitation.  - Mitral valve: Mildly calcified annulus. There was no significant    regurgitation.  - Right ventricle: The cavity size was normal. Systolic function    was normal.  - Pulmonary arteries: No complete TR doppler jet so unable to    estimate PA systolic pressure.  - Inferior vena cava: The vessel was normal in size. The    respirophasic diameter changes were in the normal range (= 50%),    consistent with normal central venous pressure.   Impressions:   - Normal LV size with mild focal basal septal hypertrophy. EF    60-65%. Normal RV size and systolic function. No significant    valvular abnormalities.   Nuclear stress 2017: Nuclear stress EF: 72%. There was no ST segment deviation noted during stress. Defect 1: There is a large defect of severe severity present in the apex location. Defect 2: There is a medium defect of severe severity present in the mid inferoseptal location. Defect 3: There is a small defect of moderate severity  present in the basal inferolateral location.   Technically challenging study; probable prior distal anterior infarct with mild peri-infarct ischemia, inferoseptal thinning and mild ischemia and thinning of the basal inferior lateral wall; EF 72 with normal wall motion.  Cath 2018: Conclusions: Significant but noncritical single vessel coronary artery disease involving the LAD and its major diagonal branch, as described below. Mild, nonobstructive CAD involving the mid RCA. Low normal left ventricular filling pressure.   Recommendations: Medical therapy of noncritical CAD in the setting of atypical chest pain. Given recent nausea/vomiting, diarrhea, and leukocytosis, gastroenteritis could also be a cause of the patient's acute chest pain this morning. Obtain transthoracic echocardiogram to evaluate LV function. IV hydration, given increased GI losses. Initiate carvedilol 3.125 mg twice a day, to be up titrated as heart rate and blood pressure allow. Obtain influenza and stool studies to evaluate for potential etiologies of patient's GI symptoms.     ABI 2019: IMPRESSION: The ankle-brachial indices are 1 bilaterally and dorsalis pedis waveforms are triphasic. There are some abnormalities described above, however the likelihood of arterial insufficiency in the lower extremities is low.  Bilateral LE ultrasound (venous): Summary:  RIGHT:  - No evidence of common femoral vein obstruction.     LEFT:  - No evidence of deep vein thrombosis in the lower extremity. No indirect  evidence of obstruction proximal to the inguinal ligament.  - No cystic structure found in the popliteal fossa.   EKG:  ECG shows NSR with HR 64.  Recent Labs: No results found for requested labs within last 8760 hours.  Recent Lipid Panel    Component Value Date/Time   CHOL 149 08/15/2016 0040   TRIG 83 08/15/2016 0040   HDL 44 08/15/2016 0040   CHOLHDL 3.4 08/15/2016 0040   VLDL 17 08/15/2016 0040    LDLCALC 88 08/15/2016 0040   LDLDIRECT 167.7 05/14/2011 1047     Physical Exam:    VS:  BP 138/82    Pulse 64    Ht 5' (1.524 m)    Wt 123 lb (55.8 kg)    LMP 06/29/1976    SpO2 96%    BMI 24.02 kg/m     Wt Readings from Last 3 Encounters:  07/08/21 123  lb (55.8 kg)  12/11/20 124 lb (56.2 kg)  11/06/20 124 lb (56.2 kg)     GEN:  Elderly female, NAD HEENT: Normal NECK: No JVD; No carotid bruits CARDIAC: RR, 2/6 SEM best heard at RUSB. No rubs or gallops RESPIRATORY:  Clear bilaterally, no wheezes ABDOMEN: Soft, non-tender, non-distended MUSCULOSKELETAL:  Chronic venous stasis changes, warm. Trivial edema SKIN: Warm and dry NEUROLOGIC:  Alert and oriented x 3 PSYCHIATRIC:  Normal affect   ASSESSMENT:    1. Coronary artery disease involving native coronary artery of native heart without angina pectoris   2. Pure hypercholesterolemia   3. Primary hypertension   4. History of subarachnoid hemorrhage    PLAN:    In order of problems listed above:  #Non-obstructive Coronary artery disease Patient with history of 70% mid-LAD, 80% distal LAD on last cath in 2018. No ischemic symptoms currently. Was intolerant to statins several years ago,but is tolerating crestor currently. Has allergy to zetia. Not able to tolerate ASA due to history of ICH. -No ischemic symptoms currently -Unable to tolerate ASA due to history of SAH -Continue crestor 5mg  daily -Repeat lipid panel per PCP  #Prior SAH with memory loss: Occurred in 2008 after fall with headstrike. Had significant memory loss following the event but made remarkable recovery. -Follow-up with PCP as scheduled -No ASA, AC or NSAIDs  #HLD: Had tried statins previously which were stopped due to muscle aches. Currently tolerating crestor today. Will repeat labs with PCP. -Continue crestor 5mg  -Has allergy to zetia  #HTN:  Slightly above goal in 130s. Will monitor and if needed, can increase amlodipine to 5mg  daily if  needed. -Follow-up with PCP as scheduled -Continue amlodipine 2.5mg  daily; can increase to 5mg  daily if needed -Continue lisinopril 10mg  daily -Continue metoprolol 25mg  daily   Medication Adjustments/Labs and Tests Ordered: Current medicines are reviewed at length with the patient today.  Concerns regarding medicines are outlined above.  Orders Placed This Encounter  Procedures   EKG 12-Lead   No orders of the defined types were placed in this encounter.   Patient Instructions  Medication Instructions:  Your physician recommends that you continue on your current medications as directed. Please refer to the Current Medication list given to you today.  *If you need a refill on your cardiac medications before your next appointment, please call your pharmacy*  Lab Work: If you have labs (blood work) drawn today and your tests are completely normal, you will receive your results only by: [place] (if you have MyChart) OR A paper copy in the mail If you have any lab test that is abnormal or we need to change your treatment, we will call you to review the results.  Testing/Procedures: None ordered today.  Follow-Up: At [facility], you and your health needs are our priority.  As part of our continuing mission to provide you with exceptional heart care, we have created designated Provider Care Teams.  These Care Teams include your primary Cardiologist (physician) and Advanced Practice Providers (APPs -  Physician Assistants and Nurse Practitioners) who all work together to provide you with the care you need, when you need it.  We recommend signing up for the patient portal called "MyChart".  Sign up information is provided on this After Visit Summary.  MyChart is used to connect with patients for Virtual Visits (Telemedicine).  Patients are able to view lab/test results, encounter notes, upcoming appointments, etc.  Non-urgent messages can be sent to your provider as well.  To  learn more about what you can do with MyChart, go to [email or web address].    Your next appointment:   6 month(s)  The format for your next appointment:   In Person  Provider:   Freada Bergeron, MD {     Signed, Freada Bergeron, MD  07/08/2021 10:31 AM    [place]

## 2021-07-08 ENCOUNTER — Ambulatory Visit (INDEPENDENT_AMBULATORY_CARE_PROVIDER_SITE_OTHER): Payer: Medicare Other | Admitting: Cardiology

## 2021-07-08 ENCOUNTER — Other Ambulatory Visit: Payer: Self-pay

## 2021-07-08 ENCOUNTER — Encounter: Payer: Self-pay | Admitting: Cardiology

## 2021-07-08 VITALS — BP 138/82 | HR 64 | Ht 60.0 in | Wt 123.0 lb

## 2021-07-08 DIAGNOSIS — I10 Essential (primary) hypertension: Secondary | ICD-10-CM

## 2021-07-08 DIAGNOSIS — I2510 Atherosclerotic heart disease of native coronary artery without angina pectoris: Secondary | ICD-10-CM | POA: Diagnosis not present

## 2021-07-08 DIAGNOSIS — Z8679 Personal history of other diseases of the circulatory system: Secondary | ICD-10-CM

## 2021-07-08 DIAGNOSIS — E7800 Pure hypercholesterolemia, unspecified: Secondary | ICD-10-CM | POA: Diagnosis not present

## 2021-07-08 NOTE — Patient Instructions (Signed)
Medication Instructions:  Your physician recommends that you continue on your current medications as directed. Please refer to the Current Medication list given to you today.  *If you need a refill on your cardiac medications before your next appointment, please call your pharmacy*  Lab Work: If you have labs (blood work) drawn today and your tests are completely normal, you will receive your results only by: [place] (if you have MyChart) OR A paper copy in the mail If you have any lab test that is abnormal or we need to change your treatment, we will call you to review the results.  Testing/Procedures: None ordered today.  Follow-Up: At [facility], you and your health needs are our priority.  As part of our continuing mission to provide you with exceptional heart care, we have created designated Provider Care Teams.  These Care Teams include your primary Cardiologist (physician) and Advanced Practice Providers (APPs -  Physician Assistants and Nurse Practitioners) who all work together to provide you with the care you need, when you need it.  We recommend signing up for the patient portal called "MyChart".  Sign up information is provided on this After Visit Summary.  MyChart is used to connect with patients for Virtual Visits (Telemedicine).  Patients are able to view lab/test results, encounter notes, upcoming appointments, etc.  Non-urgent messages can be sent to your provider as well.   To learn more about what you can do with MyChart, go to [email or web address].    Your next appointment:   6 month(s)  The format for your next appointment:   In Person  Provider:   Freada Bergeron, MD {

## 2021-07-10 DIAGNOSIS — L720 Epidermal cyst: Secondary | ICD-10-CM | POA: Diagnosis not present

## 2021-07-10 DIAGNOSIS — Z85828 Personal history of other malignant neoplasm of skin: Secondary | ICD-10-CM | POA: Diagnosis not present

## 2021-07-10 DIAGNOSIS — L900 Lichen sclerosus et atrophicus: Secondary | ICD-10-CM | POA: Diagnosis not present

## 2021-07-11 ENCOUNTER — Other Ambulatory Visit: Payer: Self-pay | Admitting: Geriatric Medicine

## 2021-07-11 DIAGNOSIS — Z1231 Encounter for screening mammogram for malignant neoplasm of breast: Secondary | ICD-10-CM

## 2021-08-18 DIAGNOSIS — I700 Atherosclerosis of aorta: Secondary | ICD-10-CM | POA: Diagnosis not present

## 2021-08-18 DIAGNOSIS — E222 Syndrome of inappropriate secretion of antidiuretic hormone: Secondary | ICD-10-CM | POA: Diagnosis not present

## 2021-08-18 DIAGNOSIS — N1831 Chronic kidney disease, stage 3a: Secondary | ICD-10-CM | POA: Diagnosis not present

## 2021-08-18 DIAGNOSIS — R300 Dysuria: Secondary | ICD-10-CM | POA: Diagnosis not present

## 2021-08-18 DIAGNOSIS — E7800 Pure hypercholesterolemia, unspecified: Secondary | ICD-10-CM | POA: Diagnosis not present

## 2021-08-18 DIAGNOSIS — I129 Hypertensive chronic kidney disease with stage 1 through stage 4 chronic kidney disease, or unspecified chronic kidney disease: Secondary | ICD-10-CM | POA: Diagnosis not present

## 2021-09-18 DIAGNOSIS — I872 Venous insufficiency (chronic) (peripheral): Secondary | ICD-10-CM | POA: Diagnosis not present

## 2021-09-18 DIAGNOSIS — K4090 Unilateral inguinal hernia, without obstruction or gangrene, not specified as recurrent: Secondary | ICD-10-CM | POA: Diagnosis not present

## 2021-09-19 ENCOUNTER — Ambulatory Visit: Payer: Medicare Other | Admitting: Podiatry

## 2021-10-22 DIAGNOSIS — M7918 Myalgia, other site: Secondary | ICD-10-CM | POA: Diagnosis not present

## 2021-10-22 DIAGNOSIS — W19XXXA Unspecified fall, initial encounter: Secondary | ICD-10-CM | POA: Diagnosis not present

## 2021-10-22 DIAGNOSIS — I10 Essential (primary) hypertension: Secondary | ICD-10-CM | POA: Diagnosis not present

## 2021-10-27 ENCOUNTER — Ambulatory Visit: Payer: Medicare Other

## 2021-11-03 ENCOUNTER — Ambulatory Visit: Payer: Medicare Other

## 2021-11-05 ENCOUNTER — Ambulatory Visit
Admission: RE | Admit: 2021-11-05 | Discharge: 2021-11-05 | Disposition: A | Discharge status: Home / Self Care | Payer: Medicare Other | Source: Ambulatory Visit | Attending: Geriatric Medicine | Admitting: Geriatric Medicine

## 2021-11-05 DIAGNOSIS — Z1231 Encounter for screening mammogram for malignant neoplasm of breast: Secondary | ICD-10-CM | POA: Diagnosis not present

## 2021-11-10 DIAGNOSIS — Z79899 Other long term (current) drug therapy: Secondary | ICD-10-CM | POA: Diagnosis not present

## 2021-11-10 DIAGNOSIS — G6289 Other specified polyneuropathies: Secondary | ICD-10-CM | POA: Diagnosis not present

## 2021-11-10 DIAGNOSIS — N390 Urinary tract infection, site not specified: Secondary | ICD-10-CM | POA: Diagnosis not present

## 2021-11-10 DIAGNOSIS — E871 Hypo-osmolality and hyponatremia: Secondary | ICD-10-CM | POA: Diagnosis not present

## 2021-11-10 DIAGNOSIS — I10 Essential (primary) hypertension: Secondary | ICD-10-CM | POA: Diagnosis not present

## 2021-11-28 DIAGNOSIS — H40013 Open angle with borderline findings, low risk, bilateral: Secondary | ICD-10-CM | POA: Diagnosis not present

## 2021-11-28 DIAGNOSIS — H04123 Dry eye syndrome of bilateral lacrimal glands: Secondary | ICD-10-CM | POA: Diagnosis not present

## 2022-02-13 DIAGNOSIS — R300 Dysuria: Secondary | ICD-10-CM | POA: Diagnosis not present

## 2022-04-27 DIAGNOSIS — N281 Cyst of kidney, acquired: Secondary | ICD-10-CM | POA: Diagnosis not present

## 2022-04-27 DIAGNOSIS — N952 Postmenopausal atrophic vaginitis: Secondary | ICD-10-CM | POA: Diagnosis not present

## 2022-04-27 DIAGNOSIS — N1831 Chronic kidney disease, stage 3a: Secondary | ICD-10-CM | POA: Diagnosis not present

## 2022-04-27 DIAGNOSIS — N3281 Overactive bladder: Secondary | ICD-10-CM | POA: Diagnosis not present

## 2022-04-27 DIAGNOSIS — N261 Atrophy of kidney (terminal): Secondary | ICD-10-CM | POA: Diagnosis not present

## 2022-04-27 DIAGNOSIS — Z87440 Personal history of urinary (tract) infections: Secondary | ICD-10-CM | POA: Diagnosis not present

## 2022-04-27 DIAGNOSIS — N3941 Urge incontinence: Secondary | ICD-10-CM | POA: Diagnosis not present

## 2022-07-01 NOTE — Progress Notes (Deleted)
Cardiology Office Note:    Date:  07/01/2022   ID:  Shannon Serrano, DOB 1928-08-05, MRN [id number]  PCP:  [name], MD  [facility] Cardiologist:  Freada Bergeron, MD  [facility] Electrophysiologist:  None   Referring MD: [name], MD    History of Present Illness:    Shannon Serrano is a 89 y.o. female with a hx of non-obstructive CAD (70% mid-LAD; 80% distal LAD in 2018), HTN, HLD, solitary right kidney, traumatic brain injury, TIA, breast cancer, and prior DVT who returns to clinic for follow-up.  Patient had a fall in 2008 and hit her head resulting in a [place] requiring brain surgery and evacuation. Had memory loss after the event and required skilled nursing/assisted living. Had to re-learn to read, write, add/substract and has made great recovery over that time. She now lives on her own and is able to drive again. She is "slowly getting her life back" and is looking to ensure she has a cardiologist to follow-up with.  Of note, cath in 2018 revealed significant but non-obstructive LAD and diag disease. Previously tried statins but stopped due to myalgias (unable to see which statins were attempted in the past). Allergy to zetia. Not able to take ASA given history of ICH.  Last seen in clinic on 06/2021 where she was doing well from a CV standpoint.   Today, ***    Past Medical History:  Diagnosis Date   Clavicle fracture 04/2004   DCIS (ductal carcinoma in situ) 2011   left breast   Diverticulosis    DVT (deep venous thrombosis) ([place]) 11/2007   Left leg- on lovenox x 1 injection only    Global amnesia 11/2006   Hearing loss    History of fibromyalgia    History of hysterectomy    History of hysterectomy for fibroids She still has her ovaries   Hyperlipidemia    History of hyperlipidemia   Hypertension    Kidney disease    stage 4   S/P radiation therapy 07/22/10 - 08/11/10   Whole Left Breast/42.56 Gy/16 Fradctions   Single kidney    RIGHT SIDE  WITH NONFUNCTIONING LEFT KIDNEY   Skin cancer L Lower Leg   Skin cancer, basal cell    leg   Stroke ([place]) 12/31/2007   mini stroke / has had several    Syncope    Varicose veins of legs     Past Surgical History:  Procedure Laterality Date   APPENDECTOMY  1948   BRAIN SURGERY     times 2   BREAST LUMPECTOMY Left 06/18/10    LEFT BREAST -HIGHG RADE DUCTAL CARCINOMA INSITU, ER+, PR+,   DOPPLER ECHOCARDIOGRAPHY  05/20/2007   EF 55-60%   eye surgery  08;09   excessive oil film removed from eyes   INTRACAPSULAR CATARACT EXTRACTION Bilateral    She also had cataract surgery on the right eye   LAPAROSCOPIC CHOLECYSTECTOMY  2001; 2004   LEFT HEART CATH AND CORONARY ANGIOGRAPHY N/A 08/14/2016   Procedure: Left Heart Cath and Coronary Angiography;  Surgeon: Nelva Bush, MD;  Location: [place] [facility];  Service: Cardiovascular;  Laterality: N/A;   VAGINAL HYSTERECTOMY  1978   fibroids    Current Medications: No outpatient medications have been marked as taking for the 07/07/22 encounter (Appointment) with Freada Bergeron, MD.     Allergies:   Morphine, Morphine and related, Aspirin, Cefpodoxime, Ciprofloxacin, Codeine, Diphenhydramine, Diphenhydramine hcl, Eszopiclone, Ezetimibe, Gabapentin, Lidocaine, Melatonin, Nitrofurantoin,  Other, Oxycodone-acetaminophen, Penicillins, Phosphate, Sodium phosphate, Valacyclovir, and Sulfa antibiotics   Social History   Socioeconomic History   Marital status: Widowed    Spouse name: Not on file   Number of children: 4   Years of education: Not on file   Highest education level: Not on file  Occupational History   Occupation: ADMIN ASST    Employer: [employer]  Tobacco Use   Smoking status: Never   Smokeless tobacco: Never  Vaping Use   Vaping Use: Never used  Substance and Sexual Activity   Alcohol use: No    Alcohol/week: 0.0 standard drinks of alcohol   Drug use: No   Sexual activity: Never    Partners: Male    Birth  control/protection: Post-menopausal    Comment: hysterectomy  Other Topics Concern   Not on file  Social History Narrative   Not on file   Social Determinants of Health   Financial Resource Strain: Not on file  Food Insecurity: Not on file  Transportation Needs: Not on file  Physical Activity: Not on file  Stress: Not on file  Social Connections: Not on file     Family History: The patient's family history includes Brain cancer in her brother; Colon cancer in her sister; Diabetes in her father; Heart attack in her sister; Heart disease in her father and mother; Hypertension in her mother; Pancreatic cancer in her brother and sister; Uterine cancer in her sister.  ROS:   Please see the history of present illness.    Review of Systems  Constitutional:  Negative for fever and malaise/fatigue.  HENT:  Positive for hearing loss. Negative for sore throat.   Eyes:  Negative for blurred vision.  Respiratory:  Negative for cough and shortness of breath.   Cardiovascular:  Negative for chest pain, palpitations, orthopnea, claudication, leg swelling and PND.  Gastrointestinal:  Negative for abdominal pain and nausea.  Genitourinary:  Negative for flank pain and hematuria.  Musculoskeletal:  Positive for joint pain.  Skin:  Negative for rash.  Neurological:  Positive for dizziness. Negative for focal weakness.  Psychiatric/Behavioral:  Negative for substance abuse.     EKGs/Labs/Other Studies Reviewed:    The following studies were reviewed today: TTE 09-14-2016: Study Conclusions   - Left ventricle: The cavity size was normal. There was mild focal    basal hypertrophy of the septum. Systolic function was normal.    The estimated ejection fraction was in the range of 60% to 65%.    Wall motion was normal; there were no regional wall motion    abnormalities. Doppler parameters are consistent with abnormal    left ventricular relaxation (grade 1 diastolic dysfunction).  - Aortic valve:  There was no stenosis. There was trivial    regurgitation.  - Mitral valve: Mildly calcified annulus. There was no significant    regurgitation.  - Right ventricle: The cavity size was normal. Systolic function    was normal.  - Pulmonary arteries: No complete TR doppler jet so unable to    estimate PA systolic pressure.  - Inferior vena cava: The vessel was normal in size. The    respirophasic diameter changes were in the normal range (= 50%),    consistent with normal central venous pressure.   Impressions:   - Normal LV size with mild focal basal septal hypertrophy. EF    60-65%. Normal RV size and systolic function. No significant    valvular abnormalities.   Nuclear stress 09-15-15: Nuclear  stress EF: 72%. There was no ST segment deviation noted during stress. Defect 1: There is a large defect of severe severity present in the apex location. Defect 2: There is a medium defect of severe severity present in the mid inferoseptal location. Defect 3: There is a small defect of moderate severity present in the basal inferolateral location.   Technically challenging study; probable prior distal anterior infarct with mild peri-infarct ischemia, inferoseptal thinning and mild ischemia and thinning of the basal inferior lateral wall; EF 72 with normal wall motion.  Cath 2018: Conclusions: Significant but noncritical single vessel coronary artery disease involving the LAD and its major diagonal branch, as described below. Mild, nonobstructive CAD involving the mid RCA. Low normal left ventricular filling pressure.   Recommendations: Medical therapy of noncritical CAD in the setting of atypical chest pain. Given recent nausea/vomiting, diarrhea, and leukocytosis, gastroenteritis could also be a cause of the patient's acute chest pain this morning. Obtain transthoracic echocardiogram to evaluate LV function. IV hydration, given increased GI losses. Initiate carvedilol 3.125 mg twice a day,  to be up titrated as heart rate and blood pressure allow. Obtain influenza and stool studies to evaluate for potential etiologies of patient's GI symptoms.     ABI 2019: IMPRESSION: The ankle-brachial indices are 1 bilaterally and dorsalis pedis waveforms are triphasic. There are some abnormalities described above, however the likelihood of arterial insufficiency in the lower extremities is low.  Bilateral LE ultrasound (venous): Summary:  RIGHT:  - No evidence of common femoral vein obstruction.     LEFT:  - No evidence of deep vein thrombosis in the lower extremity. No indirect  evidence of obstruction proximal to the inguinal ligament.  - No cystic structure found in the popliteal fossa.   EKG:  ECG shows NSR with HR 64.  Recent Labs: No results found for requested labs within last 365 days.  Recent Lipid Panel    Component Value Date/Time   CHOL 149 08/15/2016 0040   TRIG 83 08/15/2016 0040   HDL 44 08/15/2016 0040   CHOLHDL 3.4 08/15/2016 0040   VLDL 17 08/15/2016 0040   LDLCALC 88 08/15/2016 0040   LDLDIRECT 167.7 05/14/2011 1047     Physical Exam:    VS:  LMP 06/29/1976     Wt Readings from Last 3 Encounters:  07/08/21 123 lb (55.8 kg)  12/11/20 124 lb (56.2 kg)  11/06/20 124 lb (56.2 kg)     GEN:  Elderly female, NAD HEENT: Normal NECK: No JVD; No carotid bruits CARDIAC: RR, 2/6 SEM best heard at RUSB. No rubs or gallops RESPIRATORY:  Clear bilaterally, no wheezes ABDOMEN: Soft, non-tender, non-distended MUSCULOSKELETAL:  Chronic venous stasis changes, warm. Trivial edema SKIN: Warm and dry NEUROLOGIC:  Alert and oriented x 3 PSYCHIATRIC:  Normal affect   ASSESSMENT:    No diagnosis found.  PLAN:    In order of problems listed above:  #Non-obstructive Coronary artery disease Patient with history of 70% mid-LAD, 80% distal LAD on last cath in 2018. No ischemic symptoms currently. Was intolerant to statins several years ago,but is tolerating  crestor currently. Has allergy to zetia. Not able to tolerate ASA due to history of ICH. -No ischemic symptoms currently -Unable to tolerate ASA due to history of SAH -Continue crestor '5mg'$  daily  #Prior SAH with memory loss: Occurred in 2008 after fall with headstrike. Had significant memory loss following the event but made remarkable recovery. -Follow-up with PCP as scheduled -No ASA, AC or NSAIDs  #  HLD: Doing okay on crestor. -Continue crestor '5mg'$  -Has allergy to zetia  #HTN:  *** -Follow-up with PCP as scheduled -Continue amlodipine 2.'5mg'$  daily; can increase to '5mg'$  daily if needed -Continue lisinopril '10mg'$  daily -Continue metoprolol '25mg'$  daily   Medication Adjustments/Labs and Tests Ordered: Current medicines are reviewed at length with the patient today.  Concerns regarding medicines are outlined above.  No orders of the defined types were placed in this encounter.  No orders of the defined types were placed in this encounter.   There are no Patient Instructions on file for this visit.    Signed, Freada Bergeron, MD  07/01/2022 7:56 PM    [place]

## 2022-07-06 NOTE — Progress Notes (Incomplete)
Cardiology Office Note:    Date:  07/06/2022   ID:  Shannon Serrano, DOB Oct 20, 1928, MRN [id number]  PCP:  [name], MD  [facility] Cardiologist:  Freada Bergeron, MD  [facility] Electrophysiologist:  None   Referring MD: [name], MD    History of Present Illness:    Shannon Serrano is a 89 y.o. female with a hx of non-obstructive CAD (70% mid-LAD; 80% distal LAD in 2018), HTN, HLD, solitary right kidney, traumatic brain injury, TIA, breast cancer, and prior DVT who returns to clinic for follow-up.  Patient had a fall in 2008 and hit her head resulting in a [place] requiring brain surgery and evacuation. Had memory loss after the event and required skilled nursing/assisted living. Had to re-learn to read, write, add/substract and has made great recovery over that time. She now lives on her own and is able to drive again. She is "slowly getting her life back" and is looking to ensure she has a cardiologist to follow-up with.  Of note, cath in 2018 revealed significant but non-obstructive LAD and diag disease. Previously tried statins but stopped due to myalgias (unable to see which statins were attempted in the past). Allergy to zetia. Not able to take ASA given history of ICH.  Last seen in clinic on 06/2021 where she was doing well from a CV standpoint.   Today, the patient states that ***  She denies any palpitations, chest pain, shortness of breath, or peripheral edema. No lightheadedness, headaches, syncope, orthopnea, or PND.   Past Medical History:  Diagnosis Date   Clavicle fracture 04/2004   DCIS (ductal carcinoma in situ) 2011   left breast   Diverticulosis    DVT (deep venous thrombosis) ([place]) 11/2007   Left leg- on lovenox x 1 injection only    Global amnesia 11/2006   Hearing loss    History of fibromyalgia    History of hysterectomy    History of hysterectomy for fibroids She still has her ovaries   Hyperlipidemia    History of hyperlipidemia    Hypertension    Kidney disease    stage 4   S/P radiation therapy 07/22/10 - 08/11/10   Whole Left Breast/42.56 Gy/16 Fradctions   Single kidney    RIGHT SIDE WITH NONFUNCTIONING LEFT KIDNEY   Skin cancer L Lower Leg   Skin cancer, basal cell    leg   Stroke ([place]) 12/31/2007   mini stroke / has had several    Syncope    Varicose veins of legs     Past Surgical History:  Procedure Laterality Date   APPENDECTOMY  1948   BRAIN SURGERY     times 2   BREAST LUMPECTOMY Left 06/18/10    LEFT BREAST -HIGHG RADE DUCTAL CARCINOMA INSITU, ER+, PR+,   DOPPLER ECHOCARDIOGRAPHY  05/20/2007   EF 55-60%   eye surgery  08;09   excessive oil film removed from eyes   INTRACAPSULAR CATARACT EXTRACTION Bilateral    She also had cataract surgery on the right eye   LAPAROSCOPIC CHOLECYSTECTOMY  2001; 2004   LEFT HEART CATH AND CORONARY ANGIOGRAPHY N/A 08/14/2016   Procedure: Left Heart Cath and Coronary Angiography;  Surgeon: Nelva Bush, MD;  Location: [place] [facility];  Service: Cardiovascular;  Laterality: N/A;   VAGINAL HYSTERECTOMY  1978   fibroids    Current Medications: No outpatient medications have been marked as taking for the 07/07/22 encounter (Appointment) with Freada Bergeron, MD.  Allergies:   Morphine, Morphine and related, Aspirin, Cefpodoxime, Ciprofloxacin, Codeine, Diphenhydramine, Diphenhydramine hcl, Eszopiclone, Ezetimibe, Gabapentin, Lidocaine, Melatonin, Nitrofurantoin, Other, Oxycodone-acetaminophen, Penicillins, Phosphate, Sodium phosphate, Valacyclovir, and Sulfa antibiotics   Social History   Socioeconomic History   Marital status: Widowed    Spouse name: Not on file   Number of children: 4   Years of education: Not on file   Highest education level: Not on file  Occupational History   Occupation: ADMIN [employer]    Employer: [employer]  Tobacco Use   Smoking status: Never   Smokeless tobacco: Never  Vaping Use   Vaping Use: Never used   Substance and Sexual Activity   Alcohol use: No    Alcohol/week: 0.0 standard drinks of alcohol   Drug use: No   Sexual activity: Never    Partners: Male    Birth control/protection: Post-menopausal    Comment: hysterectomy  Other Topics Concern   Not on file  Social History Narrative   Not on file   Social Determinants of Health   Financial Resource Strain: Not on file  Food Insecurity: Not on file  Transportation Needs: Not on file  Physical Activity: Not on file  Stress: Not on file  Social Connections: Not on file     Family History: The patient's family history includes Brain cancer in her brother; Colon cancer in her sister; Diabetes in her father; Heart attack in her sister; Heart disease in her father and mother; Hypertension in her mother; Pancreatic cancer in her brother and sister; Uterine cancer in her sister.  ROS:   Please see the history of present illness.    Review of Systems  Constitutional:  Negative for fever and malaise/fatigue.  HENT:  Positive for hearing loss. Negative for sore throat.   Eyes:  Negative for blurred vision.  Respiratory:  Negative for cough and shortness of breath.   Cardiovascular:  Negative for chest pain, palpitations, orthopnea, claudication, leg swelling and PND.  Gastrointestinal:  Negative for abdominal pain and nausea.  Genitourinary:  Negative for flank pain and hematuria.  Musculoskeletal:  Positive for joint pain.  Skin:  Negative for rash.  Neurological:  Positive for dizziness. Negative for focal weakness.  Psychiatric/Behavioral:  Negative for substance abuse.     EKGs/Labs/Other Studies Reviewed:    The following studies were reviewed today:  Bilateral LE ultrasound (venous) 07/2017: Summary:  RIGHT:  - No evidence of common femoral vein obstruction.     LEFT:  - No evidence of deep vein thrombosis in the lower extremity. No indirect  evidence of obstruction proximal to the inguinal ligament.  - No cystic  structure found in the popliteal fossa.   ABI 2019: IMPRESSION: The ankle-brachial indices are 1 bilaterally and dorsalis pedis waveforms are triphasic. There are some abnormalities described above, however the likelihood of arterial insufficiency in the lower extremities is low.  TTE 2018: Study Conclusions   - Left ventricle: The cavity size was normal. There was mild focal    basal hypertrophy of the septum. Systolic function was normal.    The estimated ejection fraction was in the range of 60% to 65%.    Wall motion was normal; there were no regional wall motion    abnormalities. Doppler parameters are consistent with abnormal    left ventricular relaxation (grade 1 diastolic dysfunction).  - Aortic valve: There was no stenosis. There was trivial    regurgitation.  - Mitral valve: Mildly calcified annulus. There was no significant  regurgitation.  - Right ventricle: The cavity size was normal. Systolic function    was normal.  - Pulmonary arteries: No complete TR doppler jet so unable to    estimate PA systolic pressure.  - Inferior vena cava: The vessel was normal in size. The    respirophasic diameter changes were in the normal range (= 50%),    consistent with normal central venous pressure.   Impressions:   - Normal LV size with mild focal basal septal hypertrophy. EF    60-65%. Normal RV size and systolic function. No significant    valvular abnormalities.   Cath 2018: Conclusions: Significant but noncritical single vessel coronary artery disease involving the LAD and its major diagonal branch, as described below. Mild, nonobstructive CAD involving the mid RCA. Low normal left ventricular filling pressure.   Recommendations: Medical therapy of noncritical CAD in the setting of atypical chest pain. Given recent nausea/vomiting, diarrhea, and leukocytosis, gastroenteritis could also be a cause of the patient's acute chest pain this morning. Obtain transthoracic  echocardiogram to evaluate LV function. IV hydration, given increased GI losses. Initiate carvedilol 3.125 mg twice a day, to be up titrated as heart rate and blood pressure allow. Obtain influenza and stool studies to evaluate for potential etiologies of patient's GI symptoms.     Nuclear stress 2017: Nuclear stress EF: 72%. There was no ST segment deviation noted during stress. Defect 1: There is a large defect of severe severity present in the apex location. Defect 2: There is a medium defect of severe severity present in the mid inferoseptal location. Defect 3: There is a small defect of moderate severity present in the basal inferolateral location.   Technically challenging study; probable prior distal anterior infarct with mild peri-infarct ischemia, inferoseptal thinning and mild ischemia and thinning of the basal inferior lateral wall; EF 72 with normal wall motion.   EKG:  EKG is personally reviewed. 07/07/2022: 07/08/2021:  NSR with HR 64.  Recent Labs: No results found for requested labs within last 365 days.   Recent Lipid Panel    Component Value Date/Time   CHOL 149 08/15/2016 0040   TRIG 83 08/15/2016 0040   HDL 44 08/15/2016 0040   CHOLHDL 3.4 08/15/2016 0040   VLDL 17 08/15/2016 0040   LDLCALC 88 08/15/2016 0040   LDLDIRECT 167.7 05/14/2011 1047     Physical Exam:    VS:  LMP 06/29/1976     Wt Readings from Last 3 Encounters:  07/08/21 123 lb (55.8 kg)  12/11/20 124 lb (56.2 kg)  11/06/20 124 lb (56.2 kg)     GEN:  Elderly female, NAD HEENT: Normal NECK: No JVD; No carotid bruits CARDIAC: RR, ***2/6 SEM best heard at RUSB. No rubs or gallops RESPIRATORY:  Clear bilaterally, no wheezes ABDOMEN: Soft, non-tender, non-distended MUSCULOSKELETAL:  Chronic venous stasis changes, warm. ***Trivial edema SKIN: Warm and dry NEUROLOGIC:  Alert and oriented x 3 PSYCHIATRIC:  Normal affect   ASSESSMENT:    No diagnosis found.  PLAN:    In order of  problems listed above:  #Non-obstructive Coronary artery disease Patient with history of 70% mid-LAD, 80% distal LAD on last cath in 2018. No ischemic symptoms currently. Was intolerant to statins several years ago,but is tolerating crestor currently. Has allergy to zetia. Not able to tolerate ASA due to history of ICH. -No ischemic symptoms currently -Unable to tolerate ASA due to history of SAH -Continue crestor '5mg'$  daily  #Prior SAH with memory loss: Occurred in  2008 after fall with headstrike. Had significant memory loss following the event but made remarkable recovery. -Follow-up with PCP as scheduled -No ASA, AC or NSAIDs  #HLD: Doing okay on crestor. -Continue crestor '5mg'$  -Has allergy to zetia  #HTN:  *** -Follow-up with PCP as scheduled -Continue amlodipine 2.'5mg'$  daily; can increase to '5mg'$  daily if needed -Continue lisinopril '10mg'$  daily -Continue metoprolol '25mg'$  daily  Follow-up:  Medication Adjustments/Labs and Tests Ordered: Current medicines are reviewed at length with the patient today.  Concerns regarding medicines are outlined above.   No orders of the defined types were placed in this encounter.  No orders of the defined types were placed in this encounter.  There are no Patient Instructions on file for this visit.  I,Mathew Stumpf,acting as a Education administrator for Freada Bergeron, MD.,have documented all relevant documentation on the behalf of Freada Bergeron, MD,as directed by  Freada Bergeron, MD while in the presence of Freada Bergeron, MD.  ***   Signed, Madelin Rear  07/06/2022 10:54 AM    [place]

## 2022-07-07 ENCOUNTER — Ambulatory Visit: Payer: Medicare Other | Admitting: Cardiology

## 2022-08-12 ENCOUNTER — Other Ambulatory Visit: Payer: Self-pay | Admitting: Internal Medicine

## 2022-08-12 DIAGNOSIS — Z1231 Encounter for screening mammogram for malignant neoplasm of breast: Secondary | ICD-10-CM

## 2022-08-14 ENCOUNTER — Ambulatory Visit: Payer: Medicare Other | Admitting: Cardiology

## 2022-08-14 NOTE — Progress Notes (Unsigned)
Cardiology Office Note:    Date:  08/17/2022   ID:  Shannon Serrano, DOB June 03, 1929, MRN [id number]  PCP:  [name], MD  [facility] Cardiologist:  Freada Bergeron, MD  [facility] Electrophysiologist:  None   Referring MD: [name], MD    History of Present Illness:    Shannon Serrano is a 89 y.o. female with a hx of non-obstructive CAD (70% mid-LAD; 80% distal LAD in 2018), HTN, HLD, solitary right kidney, traumatic brain injury, TIA, breast cancer, and prior DVT who returns to clinic for follow-up.  Patient had a fall in 2008 and hit her head resulting in a [place] requiring brain surgery and evacuation. Had memory loss after the event and required skilled nursing/assisted living. Had to re-learn to read, write, add/substract and has made great recovery over that time. She now lives on her own and is able to drive again. She is "slowly getting her life back" and is looking to ensure she has a cardiologist to follow-up with.  Of note, cath in 2018 revealed significant but non-obstructive LAD and diag disease. Previously tried statins but stopped due to myalgias (unable to see which statins were attempted in the past). Allergy to zetia. Not able to take ASA given history of ICH.  Last seen in clinic on 06/2021 where she was doing well from a CV standpoint.  Today, the patient overall feels well. States that she is doing well from a CV standpoint. Has been dealing with chronic venous insufficiency which she states that she has been managing with herbal remedies which has been helping. No chest pain, SOB, orthopnea or PND. No lightheadedness or syncope. Dizziness resolved since prior visit.   Past Medical History:  Diagnosis Date   Clavicle fracture 04/2004   DCIS (ductal carcinoma in situ) 2011   left breast   Diverticulosis    DVT (deep venous thrombosis) ([place]) 11/2007   Left leg- on lovenox x 1 injection only    Global amnesia 11/2006   Hearing loss    History  of fibromyalgia    History of hysterectomy    History of hysterectomy for fibroids She still has her ovaries   Hyperlipidemia    History of hyperlipidemia   Hypertension    Kidney disease    stage 4   S/P radiation therapy 07/22/10 - 08/11/10   Whole Left Breast/42.56 Gy/16 Fradctions   Single kidney    RIGHT SIDE WITH NONFUNCTIONING LEFT KIDNEY   Skin cancer L Lower Leg   Skin cancer, basal cell    leg   Stroke ([place]) 12/31/2007   mini stroke / has had several    Syncope    Varicose veins of legs     Past Surgical History:  Procedure Laterality Date   APPENDECTOMY  1948   BRAIN SURGERY     times 2   BREAST LUMPECTOMY Left 06/18/10    LEFT BREAST -HIGHG RADE DUCTAL CARCINOMA INSITU, ER+, PR+,   DOPPLER ECHOCARDIOGRAPHY  05/20/2007   EF 55-60%   eye surgery  08;09   excessive oil film removed from eyes   INTRACAPSULAR CATARACT EXTRACTION Bilateral    She also had cataract surgery on the right eye   LAPAROSCOPIC CHOLECYSTECTOMY  2001; 2004   LEFT HEART CATH AND CORONARY ANGIOGRAPHY N/A 08/14/2016   Procedure: Left Heart Cath and Coronary Angiography;  Surgeon: Nelva Bush, MD;  Location: [facility] CV [facility];  Service: Cardiovascular;  Laterality: N/A;   VAGINAL HYSTERECTOMY  1978  fibroids    Current Medications: Current Meds  Medication Sig   acetaminophen (TYLENOL) 325 MG tablet Take 325 mg by mouth as directed.   amLODipine (NORVASC) 2.5 MG tablet Take 2.5 mg by mouth daily.   calcium-vitamin D (OSCAL WITH D) 500-200 MG-UNIT TABS tablet Take 1 tablet by mouth daily.   cetirizine (ZYRTEC) 10 MG tablet Take 10 mg by mouth daily.   Cranberry 200 MG CAPS Take by mouth.   Cranberry POWD Take by mouth as directed.   hydrOXYzine (ATARAX/VISTARIL) 25 MG tablet Take 25 mg by mouth 3 (three) times daily as needed.   levETIRAcetam (KEPPRA) 250 MG tablet Take 250 mg by mouth 2 (two) times daily.   lisinopril (ZESTRIL) 10 MG tablet Take 10 mg by mouth daily.   meclizine  (ANTIVERT) 25 MG tablet Take 25 mg by mouth 3 (three) times daily as needed for dizziness.   metoprolol succinate (TOPROL-XL) 25 MG 24 hr tablet Take 25 mg by mouth daily.   Omega-3 Fatty Acids (FISH OIL PO) Take 1 capsule by mouth daily.   rosuvastatin (CRESTOR) 10 MG tablet Take 1 tablet (10 mg total) by mouth daily.   rosuvastatin (CRESTOR) 5 MG tablet Take 5 mg by mouth at bedtime.   Ubiquinol 200 MG CAPS Take 1 capsule by mouth daily.   White Petrolatum-Mineral Oil ([place] PETROL-MINERAL OIL-LANOLIN) 0.1-0.1 % OINT      Allergies:   Morphine, Morphine and related, Aspirin, Cefpodoxime, Ciprofloxacin, Codeine, Diphenhydramine, Diphenhydramine hcl, Eszopiclone, Ezetimibe, Gabapentin, Lidocaine, Melatonin, Nitrofurantoin, Other, Oxycodone-acetaminophen, Penicillins, Phosphate, Sodium phosphate, Valacyclovir, and Sulfa antibiotics   Social History   Socioeconomic History   Marital status: Widowed    Spouse name: Not on file   Number of children: 4   Years of education: Not on file   Highest education level: Not on file  Occupational History   Occupation: ADMIN ASST    Employer: [employer]  Tobacco Use   Smoking status: Never   Smokeless tobacco: Never  Vaping Use   Vaping Use: Never used  Substance and Sexual Activity   Alcohol use: No    Alcohol/week: 0.0 standard drinks of alcohol   Drug use: No   Sexual activity: Never    Partners: Male    Birth control/protection: Post-menopausal    Comment: hysterectomy  Other Topics Concern   Not on file  Social History Narrative   Not on file   Social Determinants of Health   Financial Resource Strain: Not on file  Food Insecurity: Not on file  Transportation Needs: Not on file  Physical Activity: Not on file  Stress: Not on file  Social Connections: Not on file     Family History: The patient's family history includes Brain cancer in her brother; Colon cancer in her sister; Diabetes in her father; Heart attack in her  sister; Heart disease in her father and mother; Hypertension in her mother; Pancreatic cancer in her brother and sister; Uterine cancer in her sister.  ROS:   Please see the history of present illness.    Review of Systems  Constitutional:  Negative for fever and malaise/fatigue.  HENT:  Positive for congestion and hearing loss. Negative for sore throat.   Respiratory:  Negative for cough and shortness of breath.   Cardiovascular:  Negative for chest pain, palpitations, orthopnea, claudication, leg swelling and PND.  Genitourinary:  Negative for flank pain and hematuria.  Musculoskeletal:  Positive for joint pain.  Skin:  Negative for rash.  Neurological:  Negative for dizziness and focal weakness.  Psychiatric/Behavioral:  Negative for substance abuse.     EKGs/Labs/Other Studies Reviewed:    The following studies were reviewed today: TTE 09/13/16: Study Conclusions   - Left ventricle: The cavity size was normal. There was mild focal    basal hypertrophy of the septum. Systolic function was normal.    The estimated ejection fraction was in the range of 60% to 65%.    Wall motion was normal; there were no regional wall motion    abnormalities. Doppler parameters are consistent with abnormal    left ventricular relaxation (grade 1 diastolic dysfunction).  - Aortic valve: There was no stenosis. There was trivial    regurgitation.  - Mitral valve: Mildly calcified annulus. There was no significant    regurgitation.  - Right ventricle: The cavity size was normal. Systolic function    was normal.  - Pulmonary arteries: No complete TR doppler jet so unable to    estimate PA systolic pressure.  - Inferior vena cava: The vessel was normal in size. The    respirophasic diameter changes were in the normal range (= 50%),    consistent with normal central venous pressure.   Impressions:   - Normal LV size with mild focal basal septal hypertrophy. EF    60-65%. Normal RV size and systolic  function. No significant    valvular abnormalities.   Nuclear stress 09-14-2015: Nuclear stress EF: 72%. There was no ST segment deviation noted during stress. Defect 1: There is a large defect of severe severity present in the apex location. Defect 2: There is a medium defect of severe severity present in the mid inferoseptal location. Defect 3: There is a small defect of moderate severity present in the basal inferolateral location.   Technically challenging study; probable prior distal anterior infarct with mild peri-infarct ischemia, inferoseptal thinning and mild ischemia and thinning of the basal inferior lateral wall; EF 72 with normal wall motion.  Cath Sep 13, 2016: Conclusions: Significant but noncritical single vessel coronary artery disease involving the LAD and its major diagonal branch, as described below. Mild, nonobstructive CAD involving the mid RCA. Low normal left ventricular filling pressure.   Recommendations: Medical therapy of noncritical CAD in the setting of atypical chest pain. Given recent nausea/vomiting, diarrhea, and leukocytosis, gastroenteritis could also be a cause of the patient's acute chest pain this morning. Obtain transthoracic echocardiogram to evaluate LV function. IV hydration, given increased GI losses. Initiate carvedilol 3.125 mg twice a day, to be up titrated as heart rate and blood pressure allow. Obtain influenza and stool studies to evaluate for potential etiologies of patient's GI symptoms.     ABI 09/13/17: IMPRESSION: The ankle-brachial indices are 1 bilaterally and dorsalis pedis waveforms are triphasic. There are some abnormalities described above, however the likelihood of arterial insufficiency in the lower extremities is low.  Bilateral LE ultrasound (venous): Summary:  RIGHT:  - No evidence of common femoral vein obstruction.     LEFT:  - No evidence of deep vein thrombosis in the lower extremity. No indirect  evidence of obstruction  proximal to the inguinal ligament.  - No cystic structure found in the popliteal fossa.   EKG:  ECG personally reviewed and shows SB with HR 59, LVH  Recent Labs: No results found for requested labs within last 365 days.  Recent Lipid Panel    Component Value Date/Time   CHOL 149 08/15/2016 0040   TRIG 83 08/15/2016 0040   HDL 44  08/15/2016 0040   CHOLHDL 3.4 08/15/2016 0040   VLDL 17 08/15/2016 0040   LDLCALC 88 08/15/2016 0040   LDLDIRECT 167.7 05/14/2011 1047     Physical Exam:    VS:  BP 110/64   Pulse (!) 59   Ht 5' (1.524 m)   Wt 111 lb 6.4 oz (50.5 kg)   LMP 06/29/1976   SpO2 99%   BMI 21.76 kg/m     Wt Readings from Last 3 Encounters:  08/17/22 111 lb 6.4 oz (50.5 kg)  07/08/21 123 lb (55.8 kg)  12/11/20 124 lb (56.2 kg)     GEN:  Elderly female, NAD HEENT: Normal NECK: No JVD; No carotid bruits CARDIAC: RR, 2/6 SEM best heard at RUSB. No rubs or gallops RESPIRATORY:  Clear bilaterally, no wheezes ABDOMEN: Soft, non-tender, non-distended MUSCULOSKELETAL:  Chronic venous stasis changes, warm. Trivial edema SKIN: Warm and dry NEUROLOGIC:  Alert and oriented x 3 PSYCHIATRIC:  Normal affect   ASSESSMENT:    1. Coronary artery disease involving native coronary artery of native heart without angina pectoris   2. Pure hypercholesterolemia   3. Primary hypertension   4. History of subarachnoid hemorrhage    PLAN:    In order of problems listed above:  #Non-obstructive Coronary artery disease Patient with history of 70% mid-LAD, 80% distal LAD on last cath in 2018. No ischemic symptoms currently. Was intolerant to statins several years ago,but is tolerating crestor currently. Has allergy to zetia. Not able to tolerate ASA due to history of ICH. -No ischemic symptoms -Unable to tolerate ASA due to history of SAH -Continue crestor 54m daily  #Prior SAH with memory loss: Occurred in 2008 after fall with headstrike. Had significant memory loss following  the event but made remarkable recovery. -Follow-up with PCP as scheduled -No ASA, AC or NSAIDs  #HLD: Tolerating crestor well. Lipids monitored by PCP -Continue crestor 139mdaily -Has allergy to zetia  #HTN:  Well controlled and at goal. -Follow-up with PCP as scheduled -Continue amlodipine 2.7m64maily -Continue lisinopril 39m23mily -Continue metoprolol 27mg24mly   Medication Adjustments/Labs and Tests Ordered: Current medicines are reviewed at length with the patient today.  Concerns regarding medicines are outlined above.  Orders Placed This Encounter  Procedures   EKG 12-Lead   No orders of the defined types were placed in this encounter.   Patient Instructions  Medication Instructions:   Your physician recommends that you continue on your current medications as directed. Please refer to the Current Medication list given to you today.  *If you need a refill on your cardiac medications before your next appointment, please call your pharmacy*   Follow-Up: At [facility] and your health needs are our priority.  As part of our continuing mission to provide you with exceptional heart care, we have created designated Provider Care Teams.  These Care Teams include your primary Cardiologist (physician) and Advanced Practice Providers (APPs -  Physician Assistants and Nurse Practitioners) who all work together to provide you with the care you need, when you need it.  We recommend signing up for the patient portal called "MyChart".  Sign up information is provided on this After Visit Summary.  MyChart is used to connect with patients for Virtual Visits (Telemedicine).  Patients are able to view lab/test results, encounter notes, upcoming appointments, etc.  Non-urgent messages can be sent to your provider as well.   To learn more about what you can do with MyChart, go to [email or web address]  Your next appointment:   6 month(s)  Provider:   Freada Bergeron, MD          Signed, Freada Bergeron, MD  08/17/2022 11:26 AM    [place]

## 2022-08-17 ENCOUNTER — Encounter: Payer: Self-pay | Admitting: Cardiology

## 2022-08-17 ENCOUNTER — Ambulatory Visit: Payer: Medicare Other | Attending: Cardiology | Admitting: Cardiology

## 2022-08-17 VITALS — BP 110/64 | HR 59 | Ht 60.0 in | Wt 111.4 lb

## 2022-08-17 DIAGNOSIS — I10 Essential (primary) hypertension: Secondary | ICD-10-CM | POA: Insufficient documentation

## 2022-08-17 DIAGNOSIS — Z8679 Personal history of other diseases of the circulatory system: Secondary | ICD-10-CM | POA: Diagnosis not present

## 2022-08-17 DIAGNOSIS — I2510 Atherosclerotic heart disease of native coronary artery without angina pectoris: Secondary | ICD-10-CM | POA: Diagnosis not present

## 2022-08-17 DIAGNOSIS — E7800 Pure hypercholesterolemia, unspecified: Secondary | ICD-10-CM | POA: Insufficient documentation

## 2022-08-17 NOTE — Patient Instructions (Signed)
Medication Instructions:   Your physician recommends that you continue on your current medications as directed. Please refer to the Current Medication list given to you today.  *If you need a refill on your cardiac medications before your next appointment, please call your pharmacy*   Follow-Up: At [facility], you and your health needs are our priority.  As part of our continuing mission to provide you with exceptional heart care, we have created designated Provider Care Teams.  These Care Teams include your primary Cardiologist (physician) and Advanced Practice Providers (APPs -  Physician Assistants and Nurse Practitioners) who all work together to provide you with the care you need, when you need it.  We recommend signing up for the patient portal called "MyChart".  Sign up information is provided on this After Visit Summary.  MyChart is used to connect with patients for Virtual Visits (Telemedicine).  Patients are able to view lab/test results, encounter notes, upcoming appointments, etc.  Non-urgent messages can be sent to your provider as well.   To learn more about what you can do with MyChart, go to [email or web address].    Your next appointment:   6 month(s)  Provider:   Freada Bergeron, MD

## 2022-08-24 DIAGNOSIS — E7800 Pure hypercholesterolemia, unspecified: Secondary | ICD-10-CM | POA: Diagnosis not present

## 2022-08-24 DIAGNOSIS — I2510 Atherosclerotic heart disease of native coronary artery without angina pectoris: Secondary | ICD-10-CM | POA: Diagnosis not present

## 2022-08-24 DIAGNOSIS — I872 Venous insufficiency (chronic) (peripheral): Secondary | ICD-10-CM | POA: Diagnosis not present

## 2022-08-24 DIAGNOSIS — M79672 Pain in left foot: Secondary | ICD-10-CM | POA: Diagnosis not present

## 2022-08-24 DIAGNOSIS — I700 Atherosclerosis of aorta: Secondary | ICD-10-CM | POA: Diagnosis not present

## 2022-08-24 DIAGNOSIS — I129 Hypertensive chronic kidney disease with stage 1 through stage 4 chronic kidney disease, or unspecified chronic kidney disease: Secondary | ICD-10-CM | POA: Diagnosis not present

## 2022-08-24 DIAGNOSIS — Z1331 Encounter for screening for depression: Secondary | ICD-10-CM | POA: Diagnosis not present

## 2022-08-24 DIAGNOSIS — Z79899 Other long term (current) drug therapy: Secondary | ICD-10-CM | POA: Diagnosis not present

## 2022-08-24 DIAGNOSIS — E871 Hypo-osmolality and hyponatremia: Secondary | ICD-10-CM | POA: Diagnosis not present

## 2022-08-24 DIAGNOSIS — Z0000 Encounter for general adult medical examination without abnormal findings: Secondary | ICD-10-CM | POA: Diagnosis not present

## 2022-08-24 DIAGNOSIS — Z418 Encounter for other procedures for purposes other than remedying health state: Secondary | ICD-10-CM | POA: Diagnosis not present

## 2022-08-24 DIAGNOSIS — Z23 Encounter for immunization: Secondary | ICD-10-CM | POA: Diagnosis not present

## 2022-08-24 DIAGNOSIS — N1831 Chronic kidney disease, stage 3a: Secondary | ICD-10-CM | POA: Diagnosis not present

## 2022-08-26 DIAGNOSIS — H35363 Drusen (degenerative) of macula, bilateral: Secondary | ICD-10-CM | POA: Diagnosis not present

## 2022-08-26 DIAGNOSIS — H40013 Open angle with borderline findings, low risk, bilateral: Secondary | ICD-10-CM | POA: Diagnosis not present

## 2022-08-26 DIAGNOSIS — H18453 Nodular corneal degeneration, bilateral: Secondary | ICD-10-CM | POA: Diagnosis not present

## 2022-08-26 DIAGNOSIS — H18593 Other hereditary corneal dystrophies, bilateral: Secondary | ICD-10-CM | POA: Diagnosis not present

## 2022-10-05 IMAGING — MR MR CERVICAL SPINE W/O CM
5 series · 35 of 48 positions shown · non-contrast
Comparison: X-ray 12/11/2020

CLINICAL DATA: Neck pain for 1 month

EXAM:
MRI CERVICAL SPINE WITHOUT CONTRAST
TECHNIQUE: Multiplanar, multisequence MR imaging of the cervical spine was
performed. No intravenous contrast was administered.

[Series 2: T2 · sagittal · 3.0mm · 0.41mm/px · 9 of 17 slices shown (1 of 2)]
[im 1/17]
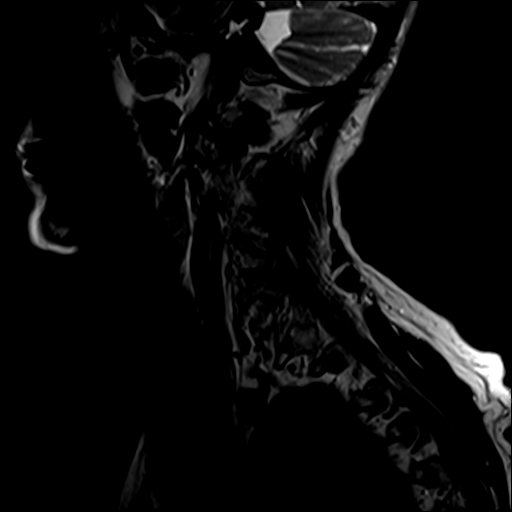
[im 3/17]
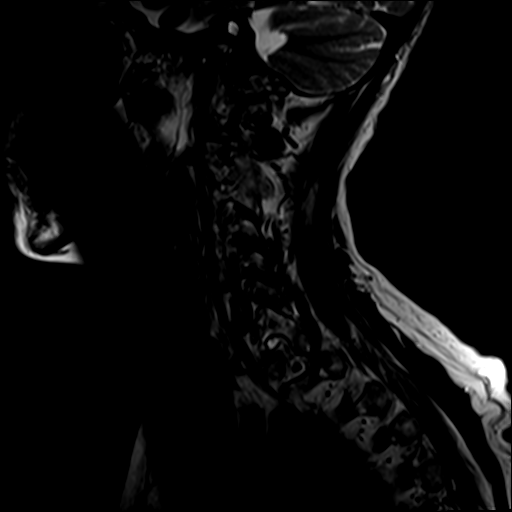
[im 5/17]
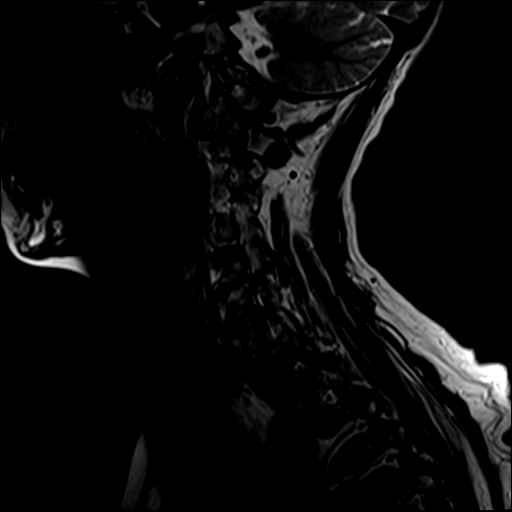
[im 7/17]
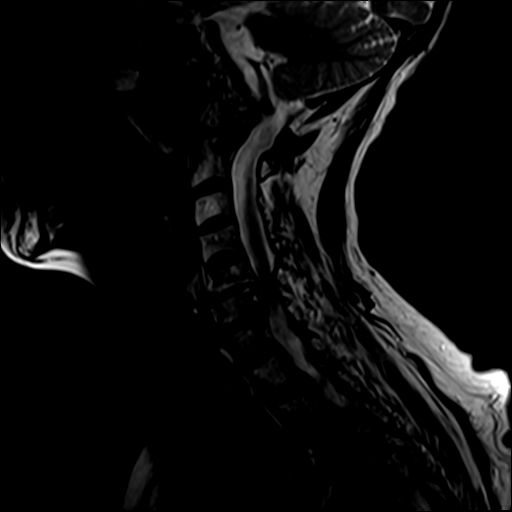
[im 9/17]
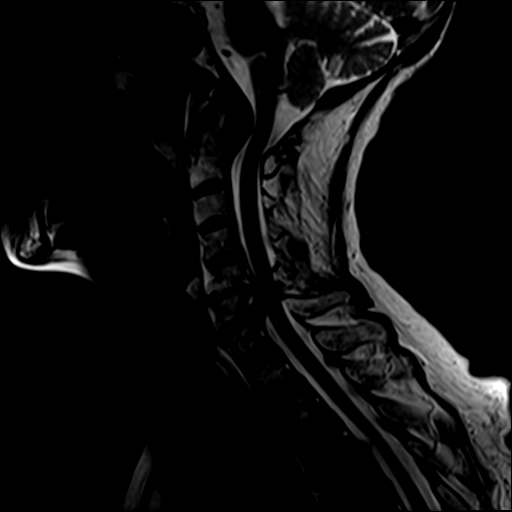
[im 11/17]
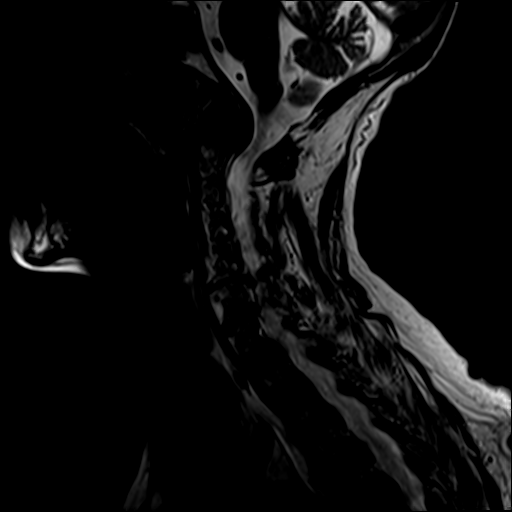
[im 13/17]
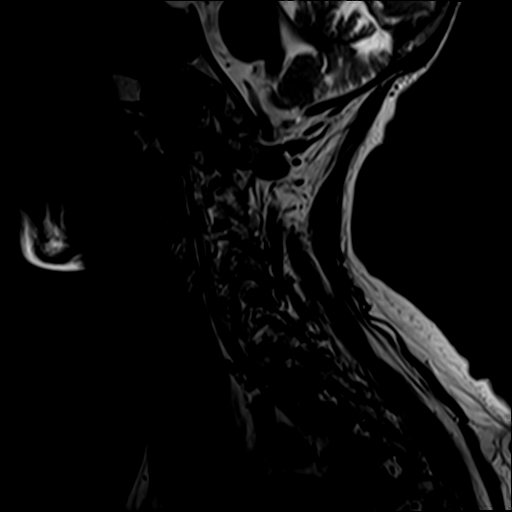
[im 15/17]
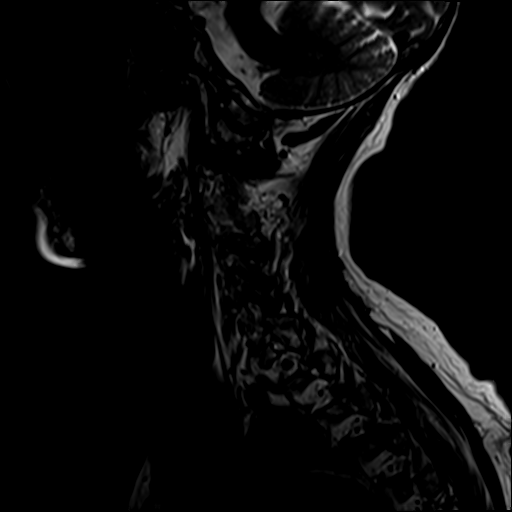
[im 17/17]
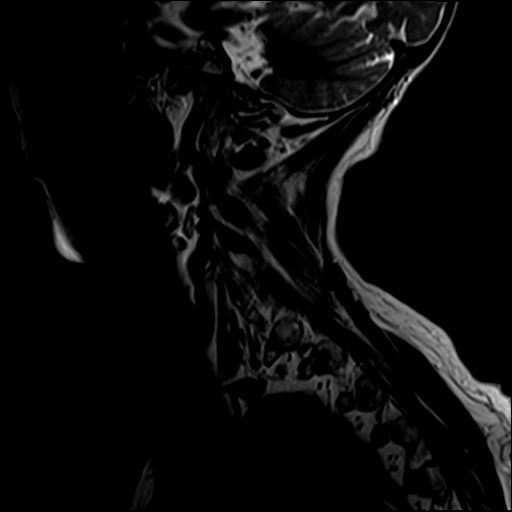

[Series 3: STIR · sagittal · 3.0mm · 0.82mm/px · 9 of 17 slices shown]
[im 1/17]
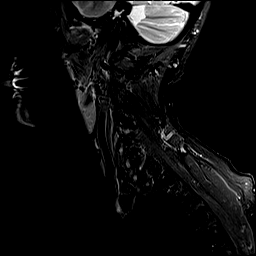
[im 3/17]
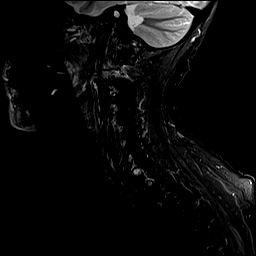
[im 5/17]
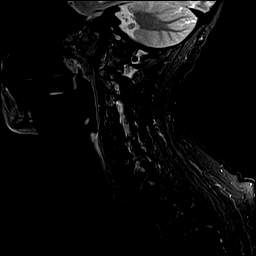
[im 7/17]
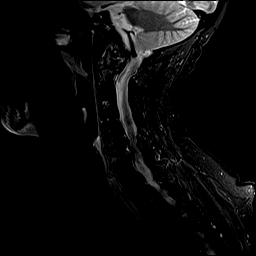
[im 9/17]
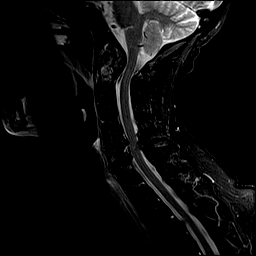
[im 11/17]
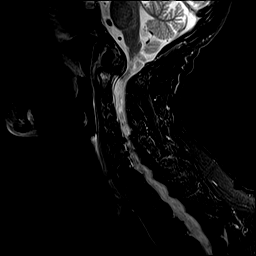
[im 13/17]
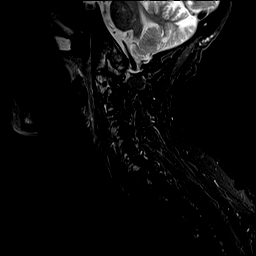
[im 15/17]
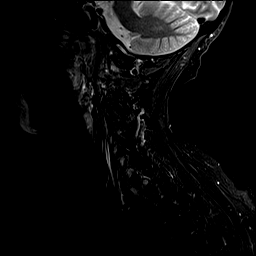
[im 17/17]
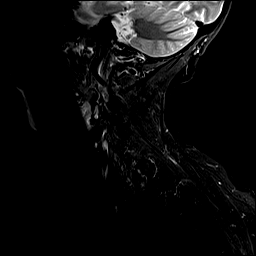

[Series 4: T1 · sagittal · 3.0mm · 0.82mm/px · 8 of 17 slices shown]
[im 1/17]
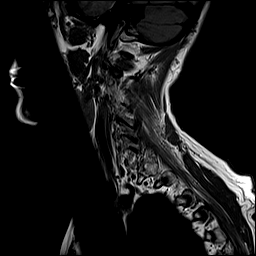
[im 3/17]
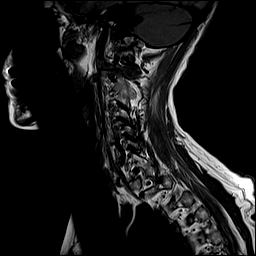
[im 5/17]
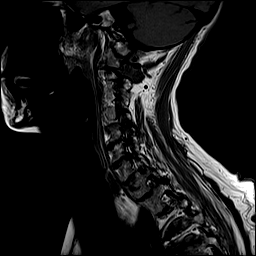
[im 7/17]
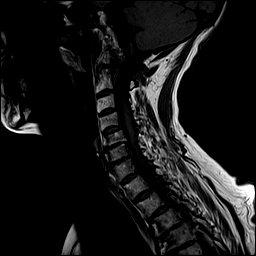
[im 10/17]
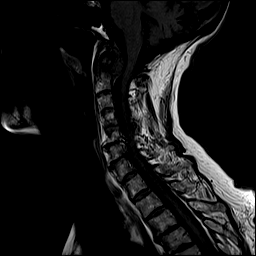
[im 12/17]
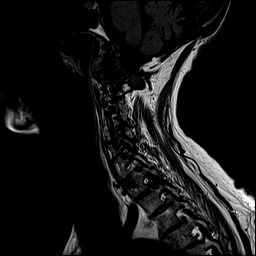
[im 14/17]
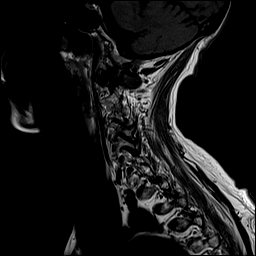
[im 17/17]
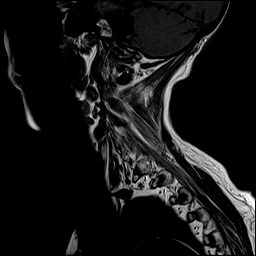

[Series 5: GRE · axial · 3.0mm · 0.35mm/px · 1 of 25 slices shown]
[im 1/25]
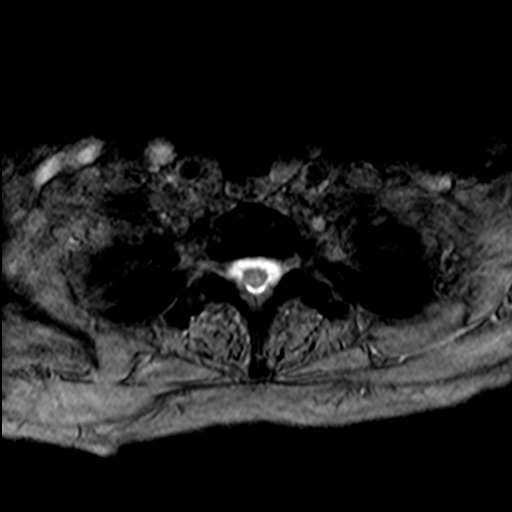

[Series 6: T2 · axial · 3.0mm · 0.70mm/px · z∈[-87,-0]mm · 8 of 21 slices shown (2 of 2)]
[im 1/21]
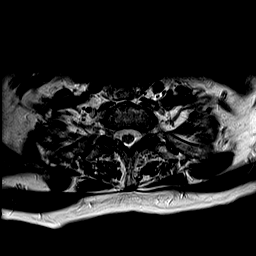
[im 3/21]
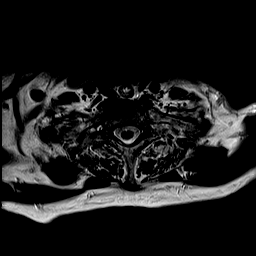
[im 7/21]
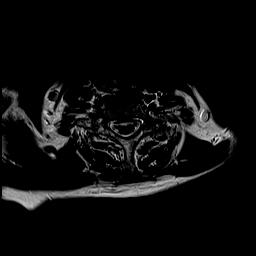
[im 9/21]
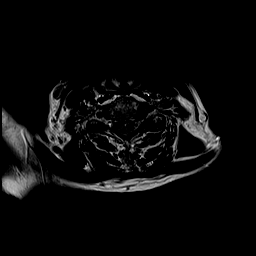
[im 12/21]
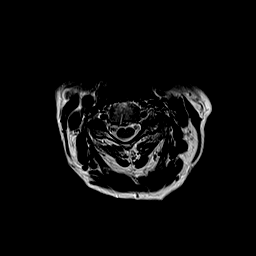
[im 14/21]
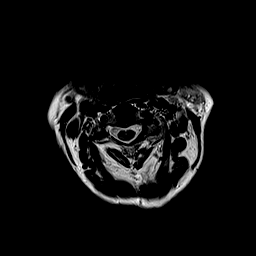
[im 18/21]
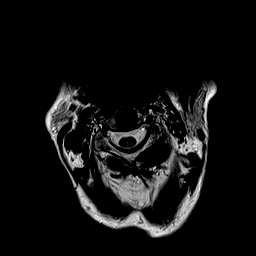
[im 21/21]
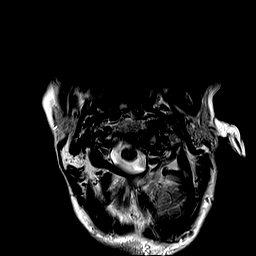

[35 of 48 positions shown; findings below may reference images not displayed]

FINDINGS: Alignment: Straightening of the cervical lordosis without static
listhesis.

Vertebrae: Advanced arthropathy of the C1-C2 articulation with
erosive changes of the odontoid process of C2 (series 4, images
8-11). Prominent soft tissue pannus posterior to the odontoid
process measuring up to 8 mm in AP dimension resulting in mass
effect on the upper cervical cord and at least moderate canal
stenosis at C1-2. Bone marrow edema within the dens and left greater
than right lateral masses, likely degenerative/reactive. No discrete
fracture is seen.

Discogenic endplate marrow changes with endplate spurring most
pronounced at C5-6 and C6-7. No evidence of acute fracture. No
evidence of discitis. No suspicious marrow replacing bone lesion.

Cord: Faint STIR hyperintense signal within the cervical cord at the
C1-C2 level without definitive abnormality seen on the T2 weighted
images. This may be artifactual or represent changes related to
compressive myelomalacia.

Posterior Fossa, vertebral arteries, paraspinal tissues: Negative.

Disc levels:

C2-C3: No disc protrusion. Right greater than left facet
arthropathy. No foraminal or canal stenosis.

C3-C4: No significant disc protrusion. Bilateral facet arthropathy
resulting in mild left foraminal stenosis. No canal stenosis.

C4-C5: Minimal posterior disc osteophyte complex with bilateral
facet and uncovertebral arthropathy resulting in moderate left and
mild right foraminal stenosis. No canal stenosis.

C5-C6: Posterior disc osteophyte complex with bilateral facet and
uncovertebral arthropathy resulting in moderate to severe canal
stenosis and moderate to severe bilateral foraminal stenosis.

C6-C7: Posterior disc osteophyte complex, eccentric to the right
results in impress upon the right hemicord and moderate canal
stenosis. Bilateral facet and uncovertebral arthropathy contribute
to moderate to severe left and mild-to-moderate right foraminal
stenosis.

C7-T1: Negative.
IMPRESSION: 1. Advanced arthropathy of the C1-C2 articulation with erosive
changes of the odontoid process of C2. Prominent soft tissue pannus
posterior to the odontoid process results in mass effect on the
upper cervical cord and at least moderate canal stenosis at C1-2.
Findings are nonspecific but most commonly secondary to an
inflammatory or crystalline arthropathy such as rheumatoid arthritis
or CPPD.
2. Faint STIR hyperintense signal within the cervical cord at the
C1-C2 level without definitive abnormality seen on the T2 weighted
images. This may be artifactual or represent changes related to
compressive myelomalacia.
3. Multilevel cervical spondylosis, most pronounced at C5-6 where
there is moderate-to-severe canal stenosis and moderate-to-severe
bilateral foraminal stenosis.
4. Moderate canal stenosis with moderate to severe left foraminal
stenosis at C6-7.

These results will be called to the ordering clinician or
representative by the Radiologist Assistant, and communication
documented in the PACS or [REDACTED].

## 2022-10-15 DIAGNOSIS — K4030 Unilateral inguinal hernia, with obstruction, without gangrene, not specified as recurrent: Secondary | ICD-10-CM | POA: Diagnosis not present

## 2022-10-15 DIAGNOSIS — R3989 Other symptoms and signs involving the genitourinary system: Secondary | ICD-10-CM | POA: Diagnosis not present

## 2022-10-15 DIAGNOSIS — R102 Pelvic and perineal pain: Secondary | ICD-10-CM | POA: Diagnosis not present

## 2022-10-21 DIAGNOSIS — L821 Other seborrheic keratosis: Secondary | ICD-10-CM | POA: Diagnosis not present

## 2022-10-21 DIAGNOSIS — Z85828 Personal history of other malignant neoplasm of skin: Secondary | ICD-10-CM | POA: Diagnosis not present

## 2022-10-21 DIAGNOSIS — D225 Melanocytic nevi of trunk: Secondary | ICD-10-CM | POA: Diagnosis not present

## 2022-10-21 DIAGNOSIS — D1801 Hemangioma of skin and subcutaneous tissue: Secondary | ICD-10-CM | POA: Diagnosis not present

## 2022-10-21 DIAGNOSIS — L218 Other seborrheic dermatitis: Secondary | ICD-10-CM | POA: Diagnosis not present

## 2022-10-21 DIAGNOSIS — D2271 Melanocytic nevi of right lower limb, including hip: Secondary | ICD-10-CM | POA: Diagnosis not present

## 2022-10-22 DIAGNOSIS — R102 Pelvic and perineal pain: Secondary | ICD-10-CM | POA: Diagnosis not present

## 2022-10-22 DIAGNOSIS — R3989 Other symptoms and signs involving the genitourinary system: Secondary | ICD-10-CM | POA: Diagnosis not present

## 2022-10-27 DIAGNOSIS — N3000 Acute cystitis without hematuria: Secondary | ICD-10-CM | POA: Diagnosis not present

## 2022-10-27 DIAGNOSIS — N3941 Urge incontinence: Secondary | ICD-10-CM | POA: Diagnosis not present

## 2022-10-27 DIAGNOSIS — N281 Cyst of kidney, acquired: Secondary | ICD-10-CM | POA: Diagnosis not present

## 2022-10-27 DIAGNOSIS — N261 Atrophy of kidney (terminal): Secondary | ICD-10-CM | POA: Diagnosis not present

## 2022-10-29 DIAGNOSIS — L03032 Cellulitis of left toe: Secondary | ICD-10-CM | POA: Diagnosis not present

## 2022-10-29 DIAGNOSIS — M2012 Hallux valgus (acquired), left foot: Secondary | ICD-10-CM | POA: Diagnosis not present

## 2022-10-29 DIAGNOSIS — M2011 Hallux valgus (acquired), right foot: Secondary | ICD-10-CM | POA: Diagnosis not present

## 2022-10-29 DIAGNOSIS — M79675 Pain in left toe(s): Secondary | ICD-10-CM | POA: Diagnosis not present

## 2022-10-29 DIAGNOSIS — L600 Ingrowing nail: Secondary | ICD-10-CM | POA: Diagnosis not present

## 2022-11-09 ENCOUNTER — Ambulatory Visit: Payer: Medicare Other

## 2022-11-25 DIAGNOSIS — Z880 Allergy status to penicillin: Secondary | ICD-10-CM | POA: Diagnosis not present

## 2022-11-25 DIAGNOSIS — W010XXA Fall on same level from slipping, tripping and stumbling without subsequent striking against object, initial encounter: Secondary | ICD-10-CM | POA: Diagnosis not present

## 2022-11-25 DIAGNOSIS — Z886 Allergy status to analgesic agent status: Secondary | ICD-10-CM | POA: Diagnosis not present

## 2022-11-25 DIAGNOSIS — S0083XA Contusion of other part of head, initial encounter: Secondary | ICD-10-CM | POA: Diagnosis not present

## 2022-11-25 DIAGNOSIS — R220 Localized swelling, mass and lump, head: Secondary | ICD-10-CM | POA: Diagnosis not present

## 2022-11-25 DIAGNOSIS — M791 Myalgia, unspecified site: Secondary | ICD-10-CM | POA: Diagnosis not present

## 2022-11-25 DIAGNOSIS — S0012XA Contusion of left eyelid and periocular area, initial encounter: Secondary | ICD-10-CM | POA: Diagnosis not present

## 2022-11-25 DIAGNOSIS — Z883 Allergy status to other anti-infective agents status: Secondary | ICD-10-CM | POA: Diagnosis not present

## 2022-11-25 DIAGNOSIS — R519 Headache, unspecified: Secondary | ICD-10-CM | POA: Diagnosis not present

## 2022-11-25 DIAGNOSIS — M542 Cervicalgia: Secondary | ICD-10-CM | POA: Diagnosis not present

## 2022-12-09 DIAGNOSIS — R300 Dysuria: Secondary | ICD-10-CM | POA: Diagnosis not present

## 2023-01-05 DIAGNOSIS — Z85828 Personal history of other malignant neoplasm of skin: Secondary | ICD-10-CM | POA: Diagnosis not present

## 2023-01-05 DIAGNOSIS — Z6822 Body mass index (BMI) 22.0-22.9, adult: Secondary | ICD-10-CM | POA: Diagnosis not present

## 2023-01-05 DIAGNOSIS — Z0100 Encounter for examination of eyes and vision without abnormal findings: Secondary | ICD-10-CM | POA: Diagnosis not present

## 2023-01-05 DIAGNOSIS — I10 Essential (primary) hypertension: Secondary | ICD-10-CM | POA: Diagnosis not present

## 2023-01-05 DIAGNOSIS — Z1231 Encounter for screening mammogram for malignant neoplasm of breast: Secondary | ICD-10-CM | POA: Diagnosis not present

## 2023-01-05 DIAGNOSIS — Z9889 Other specified postprocedural states: Secondary | ICD-10-CM | POA: Diagnosis not present

## 2023-01-05 DIAGNOSIS — N3010 Interstitial cystitis (chronic) without hematuria: Secondary | ICD-10-CM | POA: Diagnosis not present

## 2023-01-05 DIAGNOSIS — N289 Disorder of kidney and ureter, unspecified: Secondary | ICD-10-CM | POA: Diagnosis not present

## 2023-01-05 DIAGNOSIS — E785 Hyperlipidemia, unspecified: Secondary | ICD-10-CM | POA: Diagnosis not present

## 2023-01-21 ENCOUNTER — Encounter: Payer: Self-pay | Admitting: Cardiology

## 2023-03-17 ENCOUNTER — Ambulatory Visit: Payer: Medicare Other | Admitting: Cardiology

## 2023-11-28 DEATH — deceased
